# Patient Record
Sex: Male | Born: 1960 | Race: Black or African American | Hispanic: No | Marital: Married | Smoking: Former smoker
Health system: Southern US, Community
[De-identification: ages and names within clinical notes are randomized; demographics above are authoritative.]

## PROBLEM LIST (undated history)

## (undated) DIAGNOSIS — C642 Malignant neoplasm of left kidney, except renal pelvis: Secondary | ICD-10-CM

## (undated) DIAGNOSIS — M67439 Ganglion, unspecified wrist: Secondary | ICD-10-CM

## (undated) DIAGNOSIS — M1990 Unspecified osteoarthritis, unspecified site: Secondary | ICD-10-CM

## (undated) DIAGNOSIS — R109 Unspecified abdominal pain: Secondary | ICD-10-CM

## (undated) DIAGNOSIS — F329 Major depressive disorder, single episode, unspecified: Secondary | ICD-10-CM

## (undated) DIAGNOSIS — G8929 Other chronic pain: Secondary | ICD-10-CM

## (undated) DIAGNOSIS — K219 Gastro-esophageal reflux disease without esophagitis: Secondary | ICD-10-CM

## (undated) DIAGNOSIS — F32A Depression, unspecified: Secondary | ICD-10-CM

## (undated) DIAGNOSIS — Z9289 Personal history of other medical treatment: Secondary | ICD-10-CM

## (undated) DIAGNOSIS — M79606 Pain in leg, unspecified: Secondary | ICD-10-CM

## (undated) DIAGNOSIS — R0602 Shortness of breath: Secondary | ICD-10-CM

## (undated) DIAGNOSIS — G4733 Obstructive sleep apnea (adult) (pediatric): Secondary | ICD-10-CM

## (undated) DIAGNOSIS — I10 Essential (primary) hypertension: Secondary | ICD-10-CM

## (undated) HISTORY — DX: Malignant neoplasm of left kidney, except renal pelvis: C64.2

## (undated) HISTORY — DX: Other chronic pain: G89.29

## (undated) HISTORY — PX: CYSTECTOMY: SUR359

## (undated) HISTORY — DX: Pain in leg, unspecified: M79.606

## (undated) HISTORY — DX: Unspecified abdominal pain: R10.9

## (undated) HISTORY — DX: Gastro-esophageal reflux disease without esophagitis: K21.9

## (undated) HISTORY — PX: CHOLECYSTECTOMY: SHX55

## (undated) HISTORY — DX: Ganglion, unspecified wrist: M67.439

## (undated) HISTORY — DX: Obstructive sleep apnea (adult) (pediatric): G47.33

---

## 2000-08-07 ENCOUNTER — Encounter: Admission: RE | Admit: 2000-08-07 | Discharge: 2000-08-07 | Payer: Self-pay | Admitting: *Deleted

## 2001-08-10 ENCOUNTER — Encounter: Payer: Self-pay | Admitting: Emergency Medicine

## 2001-08-10 ENCOUNTER — Emergency Department (HOSPITAL_COMMUNITY): Admission: EM | Admit: 2001-08-10 | Discharge: 2001-08-10 | Payer: Self-pay | Admitting: Emergency Medicine

## 2003-10-08 ENCOUNTER — Emergency Department (HOSPITAL_COMMUNITY): Admission: EM | Admit: 2003-10-08 | Discharge: 2003-10-08 | Payer: Self-pay | Admitting: Emergency Medicine

## 2005-02-12 ENCOUNTER — Ambulatory Visit (HOSPITAL_COMMUNITY): Admission: RE | Admit: 2005-02-12 | Discharge: 2005-02-12 | Payer: Self-pay | Admitting: Internal Medicine

## 2005-05-01 ENCOUNTER — Ambulatory Visit (HOSPITAL_COMMUNITY): Admission: RE | Admit: 2005-05-01 | Discharge: 2005-05-01 | Payer: Self-pay | Admitting: Internal Medicine

## 2005-08-07 ENCOUNTER — Ambulatory Visit (HOSPITAL_COMMUNITY): Admission: RE | Admit: 2005-08-07 | Discharge: 2005-08-07 | Payer: Self-pay | Admitting: General Surgery

## 2005-08-07 ENCOUNTER — Encounter (INDEPENDENT_AMBULATORY_CARE_PROVIDER_SITE_OTHER): Payer: Self-pay | Admitting: *Deleted

## 2005-10-17 ENCOUNTER — Ambulatory Visit (HOSPITAL_COMMUNITY): Admission: RE | Admit: 2005-10-17 | Discharge: 2005-10-17 | Payer: Self-pay | Admitting: Internal Medicine

## 2006-11-08 ENCOUNTER — Emergency Department (HOSPITAL_COMMUNITY): Admission: EM | Admit: 2006-11-08 | Discharge: 2006-11-08 | Payer: Self-pay | Admitting: *Deleted

## 2006-11-10 ENCOUNTER — Emergency Department (HOSPITAL_COMMUNITY): Admission: EM | Admit: 2006-11-10 | Discharge: 2006-11-10 | Payer: Self-pay | Admitting: Emergency Medicine

## 2006-11-13 ENCOUNTER — Emergency Department (HOSPITAL_COMMUNITY): Admission: EM | Admit: 2006-11-13 | Discharge: 2006-11-13 | Payer: Self-pay | Admitting: Emergency Medicine

## 2009-05-25 ENCOUNTER — Emergency Department (HOSPITAL_COMMUNITY): Admission: EM | Admit: 2009-05-25 | Discharge: 2009-05-25 | Payer: Self-pay | Admitting: Emergency Medicine

## 2009-09-15 ENCOUNTER — Emergency Department (HOSPITAL_COMMUNITY): Admission: EM | Admit: 2009-09-15 | Discharge: 2009-09-15 | Payer: Self-pay | Admitting: Emergency Medicine

## 2010-07-22 LAB — POCT CARDIAC MARKERS: Myoglobin, poc: 113 ng/mL (ref 12–200)

## 2010-07-22 LAB — URINALYSIS, ROUTINE W REFLEX MICROSCOPIC
Bilirubin Urine: NEGATIVE
Glucose, UA: NEGATIVE mg/dL
Hgb urine dipstick: NEGATIVE
Ketones, ur: NEGATIVE mg/dL
Nitrite: NEGATIVE
Protein, ur: NEGATIVE mg/dL
Specific Gravity, Urine: 1.025 (ref 1.005–1.030)
Urobilinogen, UA: 0.2 mg/dL (ref 0.0–1.0)
pH: 6 (ref 5.0–8.0)

## 2010-07-22 LAB — RAPID URINE DRUG SCREEN, HOSP PERFORMED
Amphetamines: NOT DETECTED
Barbiturates: NOT DETECTED
Benzodiazepines: NOT DETECTED
Cocaine: NOT DETECTED
Opiates: NOT DETECTED
Tetrahydrocannabinol: POSITIVE — AB

## 2010-07-22 LAB — CBC
HCT: 40.1 % (ref 39.0–52.0)
MCHC: 34.2 g/dL (ref 30.0–36.0)
MCV: 94.5 fL (ref 78.0–100.0)
Platelets: 159 10*3/uL (ref 150–400)
RDW: 13.5 % (ref 11.5–15.5)

## 2010-07-22 LAB — BASIC METABOLIC PANEL
BUN: 13 mg/dL (ref 6–23)
CO2: 28 mEq/L (ref 19–32)
Chloride: 102 mEq/L (ref 96–112)
Creatinine, Ser: 1.36 mg/dL (ref 0.4–1.5)
Glucose, Bld: 100 mg/dL — ABNORMAL HIGH (ref 70–99)

## 2010-07-22 LAB — DIFFERENTIAL
Basophils Relative: 1 % (ref 0–1)
Eosinophils Absolute: 0.4 10*3/uL (ref 0.0–0.7)
Eosinophils Relative: 5 % (ref 0–5)
Neutrophils Relative %: 36 % — ABNORMAL LOW (ref 43–77)

## 2010-07-24 LAB — RPR: RPR Ser Ql: NONREACTIVE

## 2010-07-24 LAB — URINALYSIS, ROUTINE W REFLEX MICROSCOPIC
Glucose, UA: NEGATIVE mg/dL
Hgb urine dipstick: NEGATIVE
Ketones, ur: NEGATIVE mg/dL
Protein, ur: NEGATIVE mg/dL
pH: 6.5 (ref 5.0–8.0)

## 2010-07-24 LAB — GC/CHLAMYDIA PROBE AMP, GENITAL: GC Probe Amp, Genital: NEGATIVE

## 2010-09-21 NOTE — Op Note (Signed)
NAME:  Brandon Lawrence, Brandon Lawrence NO.:  [id number]   MEDICAL RECORD NO.:  [id number]          PATIENT TYPE:  AMB   LOCATION:  DAY                           FACILITY:  [facility]   PHYSICIAN:  Barbaraann Barthel, M.D. DATE OF BIRTH:  03-01-1961   DATE OF PROCEDURE:  08/07/2005  DATE OF DISCHARGE:                                 OPERATIVE REPORT   SURGEON:  Barbaraann Barthel, M.D.   PREOPERATIVE DIAGNOSIS:  Recurrent ganglion, right wrist.   POSTOPERATIVE DIAGNOSIS:  Final pathology pending.   Procedure:  Excision of ganglion cyst right wrist   NOTE:  This a 50 year old black male who had a recurrent cyst in his right  wrist.  He had had this removed.  Once in the office and once in surgery and  this had recurred.  Clinically this was a rather large cyst about 4-5 cm in  diameter at least.  This was adherent to the extensor tendons of the right  hand in the area of the thenar area and the dorsum of the right wrist.     We discussed excision of this and discussed complications not limited to  but including bleeding, infection, recurrence and the possibility that there  may be some paresthesias.  Informed consent was obtained.   TECHNIQUE:  The patient was placed in the supine position of the adequate  administration of Bier block anesthesia utilizing the tourniquet to 258 mmHg  we prepped and draped in the usual manner; and then used a longitudinal  incision, and then dissected a rather large ganglion cyst.  This was taken  out intact, using the needle cautery device to cauterize its area of  adhesion on the periosteum of the radius.  This was removed, carefully  dissecting around the neurovascular structures and the extensor tendons.  This was removed and sent for final pathology.  The wound was then  afterwards irrigated; and the skin was approximated subcutaneously with 4-0  Polysorb; and the skin approximated with 5-0 nylon, Neosporin with sterile  dressing was placed; and we  placed the patient as well in the  cock-up splint.  Prior to closure, all sponge, needle and instrument counts  were found to be correct.  Estimated blood loss was minimal.  The patient  tolerated the procedure well.  No drains were placed; and there were no  complications.      Barbaraann Barthel, M.D.  Electronically Signed     WB/MEDQ  D:  08/07/2005  T:  08/08/2005  Job:  [id number]   cc:   Tesfaye D. Felecia Shelling, MD  Fax: [phone]   Vickki Hearing, M.D.  Fax: [phone]

## 2011-02-19 LAB — CBC
Hemoglobin: 12.8 — ABNORMAL LOW
MCHC: 34.5
RBC: 4.01 — ABNORMAL LOW
WBC: 6.7

## 2011-02-19 LAB — POCT CARDIAC MARKERS
CKMB, poc: 1.7
CKMB, poc: 1.8
Myoglobin, poc: 127
Myoglobin, poc: 136
Operator id: 218581
Troponin i, poc: <0.05

## 2011-02-19 LAB — BASIC METABOLIC PANEL
CO2: 27
Calcium: 8.8
Creatinine, Ser: 1.37
GFR calc Af Amer: >60
Sodium: 136

## 2011-02-19 LAB — DIFFERENTIAL
Lymphocytes Relative: 41
Lymphs Abs: 2.8
Monocytes Absolute: 0.6
Monocytes Relative: 10
Neutro Abs: 2.9
Neutrophils Relative %: 43

## 2011-06-21 ENCOUNTER — Encounter: Payer: Self-pay | Admitting: Family Medicine

## 2011-06-21 ENCOUNTER — Ambulatory Visit (INDEPENDENT_AMBULATORY_CARE_PROVIDER_SITE_OTHER): Payer: Medicare Other | Admitting: Family Medicine

## 2011-06-21 VITALS — BP 140/82 | HR 87 | Resp 16 | Ht 67.0 in | Wt 152.1 lb

## 2011-06-21 DIAGNOSIS — R7309 Other abnormal glucose: Secondary | ICD-10-CM

## 2011-06-21 DIAGNOSIS — R0789 Other chest pain: Secondary | ICD-10-CM | POA: Diagnosis not present

## 2011-06-21 DIAGNOSIS — R51 Headache: Secondary | ICD-10-CM

## 2011-06-21 DIAGNOSIS — M6740 Ganglion, unspecified site: Secondary | ICD-10-CM

## 2011-06-21 DIAGNOSIS — Z13220 Encounter for screening for lipoid disorders: Secondary | ICD-10-CM | POA: Diagnosis not present

## 2011-06-21 DIAGNOSIS — R109 Unspecified abdominal pain: Secondary | ICD-10-CM

## 2011-06-21 DIAGNOSIS — N509 Disorder of male genital organs, unspecified: Secondary | ICD-10-CM

## 2011-06-21 DIAGNOSIS — IMO0002 Reserved for concepts with insufficient information to code with codable children: Secondary | ICD-10-CM

## 2011-06-21 DIAGNOSIS — R519 Headache, unspecified: Secondary | ICD-10-CM | POA: Insufficient documentation

## 2011-06-21 DIAGNOSIS — G8929 Other chronic pain: Secondary | ICD-10-CM | POA: Insufficient documentation

## 2011-06-21 LAB — CBC WITH DIFFERENTIAL/PLATELET
Eosinophils Absolute: 0.4 10*3/uL (ref 0.0–0.7)
HCT: 38.8 % — ABNORMAL LOW (ref 39.0–52.0)
Hemoglobin: 13.5 g/dL (ref 13.0–17.0)
Lymphs Abs: 3.4 10*3/uL (ref 0.7–4.0)
MCH: 31 pg (ref 26.0–34.0)
MCHC: 34.8 g/dL (ref 30.0–36.0)
Monocytes Absolute: 0.8 10*3/uL (ref 0.1–1.0)
Monocytes Relative: 10 % (ref 3–12)
Neutrophils Relative %: 40 % — ABNORMAL LOW (ref 43–77)
RBC: 4.35 MIL/uL (ref 4.22–5.81)

## 2011-06-21 LAB — HEMOGLOBIN A1C: Hgb A1c MFr Bld: 5.8 % — ABNORMAL HIGH (ref <5.7)

## 2011-06-21 LAB — COMPREHENSIVE METABOLIC PANEL
AST: 24 U/L (ref 0–37)
Albumin: 4.3 g/dL (ref 3.5–5.2)
Alkaline Phosphatase: 40 U/L (ref 39–117)
BUN: 12 mg/dL (ref 6–23)
Creat: 1.22 mg/dL (ref 0.50–1.35)
Glucose, Bld: 106 mg/dL — ABNORMAL HIGH (ref 70–99)
Potassium: 4.3 mEq/L (ref 3.5–5.3)
Total Bilirubin: 0.4 mg/dL (ref 0.3–1.2)

## 2011-06-21 LAB — LIPID PANEL
Cholesterol: 168 mg/dL (ref 0–200)
HDL: 42 mg/dL (ref >39)
Total CHOL/HDL Ratio: 4 Ratio
VLDL: 11 mg/dL (ref 0–40)

## 2011-06-21 LAB — LIPASE: Lipase: 27 U/L (ref 0–75)

## 2011-06-21 MED ORDER — OMEPRAZOLE 20 MG PO CPDR: 20.0000 mg | DELAYED_RELEASE_CAPSULE | Freq: Every day | ORAL | Status: DC

## 2011-06-21 NOTE — Assessment & Plan Note (Signed)
He has had 2 removed he currently has another on the right wrist. At this time we will monitor if it becomes problematic will refer for removal

## 2011-06-21 NOTE — Assessment & Plan Note (Signed)
I think he is getting some rebound headaches from that over use of Tylenol and Advil. I will need a more detailed history about his headaches and will monitor. At this time no other medications

## 2011-06-21 NOTE — Assessment & Plan Note (Signed)
Upon exam patient was planning to more scrotal pain. There is no swelling or rash noted. He's had this pain on and off for some time. He was referred to urology in the past but did not see them. No red flags

## 2011-06-21 NOTE — Patient Instructions (Signed)
Avoid using motrin pm every night, this can cause worsening stomach pains Start the stomach medication- take daily for heartburn  I will review your records from Dr. Felecia Shelling Please get the bloodwork done- do not eat after midnight F/U in 2 weeks
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
## 2011-06-21 NOTE — Assessment & Plan Note (Addendum)
Chronic abdominal pain. All of these records to see what workup he has had done. He has no abdominal scans in the [facility] system. Will start low dose PPI for heartburn symptoms

## 2011-06-21 NOTE — Assessment & Plan Note (Signed)
Is very difficult to get specifics on patient regarding his abdominal and chest pain. It appears very atypical and likely musculoskeletal. Other differentials include GI such as heartburn. EKG was unremarkable with exception of transient bradycardia. I will obtain records from his previous physicians. Last chest x-ray in 2011 was within normal limits. Blood work to be obtained

## 2011-06-28 ENCOUNTER — Encounter: Payer: Self-pay | Admitting: Family Medicine

## 2011-07-05 ENCOUNTER — Ambulatory Visit (INDEPENDENT_AMBULATORY_CARE_PROVIDER_SITE_OTHER): Payer: Medicare Other | Admitting: Family Medicine

## 2011-07-05 ENCOUNTER — Encounter: Payer: Self-pay | Admitting: Family Medicine

## 2011-07-05 VITALS — BP 134/72 | HR 68 | Resp 18 | Ht 67.0 in | Wt 152.0 lb

## 2011-07-05 DIAGNOSIS — Z23 Encounter for immunization: Secondary | ICD-10-CM

## 2011-07-05 DIAGNOSIS — R12 Heartburn: Secondary | ICD-10-CM | POA: Diagnosis not present

## 2011-07-05 DIAGNOSIS — R0789 Other chest pain: Secondary | ICD-10-CM

## 2011-07-05 DIAGNOSIS — R109 Unspecified abdominal pain: Secondary | ICD-10-CM

## 2011-07-05 DIAGNOSIS — IMO0002 Reserved for concepts with insufficient information to code with codable children: Secondary | ICD-10-CM

## 2011-07-05 DIAGNOSIS — N509 Disorder of male genital organs, unspecified: Secondary | ICD-10-CM

## 2011-07-05 DIAGNOSIS — G8929 Other chronic pain: Secondary | ICD-10-CM

## 2011-07-05 NOTE — Progress Notes (Signed)
  Subjective:    Patient ID: Brandon Lawrence, male    DOB: [birth year], 51 y.o.   MRN: [id number]  HPI Patient here to followup medication and labs. He was started on omeprazole for his abdominal pain and chest pain which is thought to be likely acid related. He states his abdominal pain is much improved he is able to sleep. He denies any further chest pain.  Scrotal pain- he continues to have pains in his scrotal region. His exam was normal and her last visit.No difficulty urinating, no swelling  Labs reviewed  Review of Systems - per above  GEN- No recent illness,   GU- no change in bowel or bladder ,  CVS- No CP   RESP- no SOB     Objective:   Physical Exam GEN-NAD, alert and oriented x 3       Assessment & Plan:

## 2011-07-05 NOTE — Patient Instructions (Signed)
I will set you up with a urologist for the scrotal pain, we will call with the appointment time  Continue the stomach medication- omeprazole  Your labs look good. F/U in 6 months

## 2011-07-07 DIAGNOSIS — K219 Gastro-esophageal reflux disease without esophagitis: Secondary | ICD-10-CM | POA: Insufficient documentation

## 2011-07-07 NOTE — Assessment & Plan Note (Signed)
Will refer to urology

## 2011-07-07 NOTE — Assessment & Plan Note (Signed)
Improved with PPI, will continue

## 2011-07-07 NOTE — Assessment & Plan Note (Signed)
Heartburn component improved, no further work-up at this time REviewed labs

## 2011-07-25 DIAGNOSIS — F411 Generalized anxiety disorder: Secondary | ICD-10-CM | POA: Diagnosis not present

## 2011-08-03 ENCOUNTER — Encounter (HOSPITAL_COMMUNITY): Payer: Self-pay | Admitting: *Deleted

## 2011-08-03 ENCOUNTER — Emergency Department (HOSPITAL_COMMUNITY)
Admission: EM | Admit: 2011-08-03 | Discharge: 2011-08-03 | Disposition: A | Discharge status: Home / Self Care | Payer: Medicare Other | Attending: Emergency Medicine | Admitting: Emergency Medicine

## 2011-08-03 DIAGNOSIS — S00522A Blister (nonthermal) of oral cavity, initial encounter: Secondary | ICD-10-CM

## 2011-08-03 DIAGNOSIS — F17200 Nicotine dependence, unspecified, uncomplicated: Secondary | ICD-10-CM | POA: Diagnosis not present

## 2011-08-03 DIAGNOSIS — S1092XA Blister (nonthermal) of unspecified part of neck, initial encounter: Secondary | ICD-10-CM | POA: Diagnosis not present

## 2011-08-03 DIAGNOSIS — S0002XA Blister (nonthermal) of scalp, initial encounter: Secondary | ICD-10-CM | POA: Diagnosis not present

## 2011-08-03 DIAGNOSIS — K1370 Unspecified lesions of oral mucosa: Secondary | ICD-10-CM | POA: Diagnosis not present

## 2011-08-03 NOTE — Discharge Instructions (Signed)
Blisters Blisters are fluid-filled sacs that form within the skin. Common causes of blistering are friction, burns, and exposure to irritating chemicals. The fluid in the blister protects the underlying damaged skin. Most of the time it is not recommended that you open blisters. When a blister is opened, there is an increased chance for infection. Usually, a blister will open on its own. They then dry up and peel off within 10 days. If the blister is tense and uncomfortable (painful) the fluid may be drained. If it is drained the roof of the blister should be left intact. The draining should only be done by a medical professional under aseptic conditions. Poorly fitting shoes and boots can cause blisters by being too tight or too loose. Wearing extra socks or using tape, bandages, or pads over the blister-prone area helps prevent the problem by reducing friction. Blisters heal more slowly if you have diabetes or if you have problems with your circulation. You need to be careful about medical follow-up to prevent infection. HOME CARE INSTRUCTIONS  Protect areas where blisters have formed until the skin is healed. Use a special bandage with a hole cut in the middle around the blister. This reduces pressure and friction. When the blister breaks, trim off the loose skin and keep the area clean by washing it with soap daily. Soaking the blister or broken-open blister with diluted vinegar twice daily for 15 minutes will dry it up and speed the healing. Use 3 tablespoons of white vinegar per quart of water (45 mL white vinegar per liter of water). An antibiotic ointment and a bandage can be used to cover the area after soaking.  SEEK MEDICAL CARE IF:   You develop increased redness, pain, swelling, or drainage in the blistered area.   You develop a pus-like discharge from the blistered area, chills, or a fever.  MAKE SURE YOU:   Understand these instructions.   Will watch your condition.   Will get help  right away if you are not doing well or get worse.  Document Released: 05/30/2004 Document Revised: 04/11/2011 Document Reviewed: 04/27/2008 [employer] Patient Information 2012 [employer]. 

## 2011-08-03 NOTE — ED Notes (Signed)
Pt has a large blood filled blister in the back of his mouth. Pt denies injury.

## 2011-08-03 NOTE — ED Provider Notes (Signed)
History   This chart was scribed for Geoffery Lyons, MD by Melba Coon. The patient was seen in room [facility] and the patient's care was started at 9:58AM.    CSN: [id number]  Arrival date & time 08/03/11  8295   First MD Initiated Contact with Patient 08/03/11 [phone]      Chief Complaint  Patient presents with  . Mouth Lesions    (Consider location/radiation/quality/duration/timing/severity/associated sxs/prior treatment) HPI Brandon Lawrence is a 51 y.o. male who presents to the Emergency Department complaining of a moderate to severely large, blood-filled blister in the back of his mouth with an onset yesterday. No trauma in the mouth; pt states that he was "giving a hickey to a girl" yesterday. Later on, he noticed that he "felt something funny" in his mouth and when he woke up this morning, the blister was very enlarged. Pt states that the blister is not painful and that he has never experienced anything like this before. No HA, fever, neck pain, back pain, CP, SOB, abd pain, n/v/d, or extremity pain, numbness, weakness, or tingling. No known allergies. No other pertinent medical problems.  Past Medical History  Diagnosis Date  . Chronic abdominal pain   . Ganglion cyst of wrist     Recurrent    Past Surgical History  Procedure Date  . Gallstones removed   . Cystectomy     Ganglion- right wrist    Family History  Problem Relation Age of Onset  . Diabetes Brother   . Cancer Brother   . Thyroid disease Mother     History  Substance Use Topics  . Smoking status: Former Smoker -- 1.0 packs/day for 20 years    Types: Cigarettes  . Smokeless tobacco: Not on file  . Alcohol Use: No     previous alcoholic      Review of Systems 10 Systems reviewed and all are negative for acute change except as noted in the HPI.   Allergies  Review of patient's allergies indicates no known allergies.  Home Medications   Current Outpatient Rx  Name Route Sig Dispense Refill   . DIPHENHYDRAMINE-APAP (SLEEP) 25-500 MG PO TABS Oral Take 1 tablet by mouth at bedtime as needed.    . IBUPROFEN 200 MG PO TABS Oral Take 200 mg by mouth every 6 (six) hours as needed.    [name] OMEPRAZOLE 20 MG PO CPDR Oral Take 1 capsule (20 mg total) by mouth daily. 30 capsule 1    BP 143/71  Pulse 84  Temp(Src) 98.1 F (36.7 C) (Oral)  Resp 18  Ht 5\' 7"  (1.702 m)  Wt 152 lb (68.947 kg)  BMI 23.81 kg/m2  SpO2 100%  Physical Exam  Nursing note and vitals reviewed. Constitutional: He is oriented to person, place, and time. He appears well-developed and well-nourished.       Awake, alert, nontoxic appearance.  HENT:  Head: Normocephalic and atraumatic.       On roof of mouth, 1 cm round blood blister located on back on hard palate; no surrounding erythema, remainder of oropharynx appears unremarkable  Eyes: EOM are normal. Pupils are equal, round, and reactive to light. Right eye exhibits no discharge. Left eye exhibits no discharge.  Neck: Normal range of motion. Neck supple.  Cardiovascular: Normal rate and regular rhythm.   Pulmonary/Chest: Effort normal. He exhibits no tenderness.  Abdominal: Soft. There is no tenderness. There is no rebound.  Musculoskeletal: He exhibits no tenderness.  Baseline ROM, no obvious new focal weakness.  Neurological: He is alert and oriented to person, place, and time.       Mental status and motor strength appears baseline for patient and situation.  Skin: Skin is warm. No rash noted.  Psychiatric: He has a normal mood and affect. His behavior is normal.    ED Course  Procedures (including critical care time)  DIAGNOSTIC STUDIES: Oxygen Saturation is 100% on room air, normal by my interpretation.    COORDINATION OF CARE:  10:03AM - EDMD states that the blister will have to run its course and pop on its own. EDMD suspects that it is not an infection and therefore abx will not be needed. Pt is advised to f/u if it gets worse, but pt is  ready for d/c.    Labs Reviewed - No data to display No results found.   No diagnosis found.    MDM   I personally performed the services described in this documentation, which was scribed in my presence. The recorded information has been reviewed and considered.          Geoffery Lyons, MD 08/03/11 [phone]

## 2011-08-08 DIAGNOSIS — F411 Generalized anxiety disorder: Secondary | ICD-10-CM | POA: Diagnosis not present

## 2012-01-07 ENCOUNTER — Ambulatory Visit: Payer: Medicare Other | Admitting: Family Medicine

## 2012-01-23 ENCOUNTER — Encounter: Payer: Self-pay | Admitting: Family Medicine

## 2012-01-23 ENCOUNTER — Ambulatory Visit (INDEPENDENT_AMBULATORY_CARE_PROVIDER_SITE_OTHER): Payer: Medicare Other | Admitting: Family Medicine

## 2012-01-23 VITALS — BP 120/80 | HR 61 | Resp 16 | Ht 67.0 in | Wt 150.0 lb

## 2012-01-23 DIAGNOSIS — Z23 Encounter for immunization: Secondary | ICD-10-CM

## 2012-01-23 DIAGNOSIS — G8929 Other chronic pain: Secondary | ICD-10-CM

## 2012-01-23 DIAGNOSIS — R109 Unspecified abdominal pain: Secondary | ICD-10-CM

## 2012-01-23 DIAGNOSIS — K219 Gastro-esophageal reflux disease without esophagitis: Secondary | ICD-10-CM

## 2012-01-23 DIAGNOSIS — Z1211 Encounter for screening for malignant neoplasm of colon: Secondary | ICD-10-CM

## 2012-01-23 DIAGNOSIS — IMO0002 Reserved for concepts with insufficient information to code with codable children: Secondary | ICD-10-CM

## 2012-01-23 DIAGNOSIS — N509 Disorder of male genital organs, unspecified: Secondary | ICD-10-CM

## 2012-01-23 MED ORDER — OMEPRAZOLE 20 MG PO CPDR: 20.0000 mg | DELAYED_RELEASE_CAPSULE | Freq: Every day | ORAL | Status: DC

## 2012-01-23 NOTE — Patient Instructions (Addendum)
Referral for colonoscopy Restart the acid pill Call the urologist and get a an appointment  Flu shot given F/U 6 months

## 2012-01-23 NOTE — Progress Notes (Signed)
  Subjective:    Patient ID: Brandon Lawrence, male    DOB: [birth year], 51 y.o.   MRN: [id number]  HPI   Patient presents to followup his abdominal pain. He ran out of his Prilosec and since then has had recurrent abdominal pain up into chest which she feels a burning sensation in his chest. He denies sharp chest pain that is radiating denies shortness of breath. He continues to have testicular pain he was seen by urology but missed his followup appointment. He also complains of erectile dysfunction was given medication by urology however does not know the name of this.  Review of Systems  GEN- denies fatigue, fever, weight loss,weakness, recent illness HEENT- denies eye drainage, change in vision, nasal discharge, CVS- denies chest pain, palpitations RESP- denies SOB, cough, wheeze ABD- denies N/V, change in stools, abd pain, +heartburn  GU- denies dysuria, hematuria, dribbling, incontinence MSK- denies joint pain, muscle aches, injury Neuro- denies headache, dizziness, syncope, seizure activity      Objective:   Physical Exam GEN- NAD, alert and oriented x3 HEENT- PERRL, EOMI, non injected sclera, pink conjunctiva, MMM, oropharynx clear Neck- Supple, no thryomegaly CVS- RRR, no murmur RESP-CTAB ABD-NABS,soft,NT,ND EXT- No edema Pulses- Radial, DP- 2+   EKG- sinus bradycardia     Assessment & Plan:

## 2012-01-26 NOTE — Assessment & Plan Note (Signed)
Restart PPI his abd pain and chest symptoms are consistent with acid reflux, these all resolved when he was on PPI previously

## 2012-01-26 NOTE — Assessment & Plan Note (Signed)
Urology note states exam was normal and they felt it was more anxiety, he was given limbitrol, which he is not taking currently , he was to f/u with them, he will discuss his ED as well.

## 2012-01-26 NOTE — Assessment & Plan Note (Signed)
Improves with treatment of GERD, minimal weight change, refer for colonoscopy

## 2012-01-30 ENCOUNTER — Ambulatory Visit (INDEPENDENT_AMBULATORY_CARE_PROVIDER_SITE_OTHER): Payer: Medicare Other | Admitting: Gastroenterology

## 2012-01-30 ENCOUNTER — Encounter: Payer: Self-pay | Admitting: Gastroenterology

## 2012-01-30 VITALS — BP 135/75 | HR 53 | Temp 97.4°F | Ht 66.0 in | Wt 148.8 lb

## 2012-01-30 DIAGNOSIS — Z1211 Encounter for screening for malignant neoplasm of colon: Secondary | ICD-10-CM | POA: Insufficient documentation

## 2012-01-30 DIAGNOSIS — K3189 Other diseases of stomach and duodenum: Secondary | ICD-10-CM | POA: Diagnosis not present

## 2012-01-30 DIAGNOSIS — M542 Cervicalgia: Secondary | ICD-10-CM

## 2012-01-30 DIAGNOSIS — R1013 Epigastric pain: Secondary | ICD-10-CM | POA: Insufficient documentation

## 2012-01-30 MED ORDER — PEG 3350-KCL-NA BICARB-NACL 420 G PO SOLR: 4000.0000 mL | ORAL | Status: DC

## 2012-01-30 NOTE — Patient Instructions (Addendum)
We have set you up for an upper endoscopy and colonoscopy with Dr. Jena Gauss in the near future.  Continue to take Prilosec daily.   We have also set you up for an ultrasound of your neck. We will contact you with the results.
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
## 2012-01-31 ENCOUNTER — Ambulatory Visit (HOSPITAL_COMMUNITY)
Admission: RE | Admit: 2012-01-31 | Discharge: 2012-01-31 | Disposition: A | Discharge status: Home / Self Care | Payer: Medicare Other | Source: Ambulatory Visit | Attending: Gastroenterology | Admitting: Gastroenterology

## 2012-01-31 DIAGNOSIS — R599 Enlarged lymph nodes, unspecified: Secondary | ICD-10-CM | POA: Insufficient documentation

## 2012-01-31 DIAGNOSIS — M542 Cervicalgia: Secondary | ICD-10-CM

## 2012-02-03 ENCOUNTER — Other Ambulatory Visit: Payer: Self-pay | Admitting: Gastroenterology

## 2012-02-03 DIAGNOSIS — R1013 Epigastric pain: Secondary | ICD-10-CM

## 2012-02-03 NOTE — Progress Notes (Signed)
Faxed to PCP

## 2012-02-03 NOTE — Assessment & Plan Note (Signed)
Left submandibular adenopathy, TTP. Pt without current sinus infection, cold, illness, etc. States it has been bothering him for "some time". [place] of neck in near future, defer to PCP for further management.

## 2012-02-03 NOTE — Assessment & Plan Note (Signed)
Upper abdominal pain, chronic N/V, regurgitation in the setting of chronic NSAIDs and GERD. Recently placed on Prilosec by PCP. No dysphagia. Proceed with EGD at time of TCS due to new -onset dyspepsia. As of note, does have hx of chronic abdominal pain, but his upper GI tract has not been evaluated in past.   Proceed with upper endoscopy in the near future with Dr. Jena Gauss. The risks, benefits, and alternatives have been discussed in detail with patient. They have stated understanding and desire to proceed.

## 2012-02-03 NOTE — Assessment & Plan Note (Signed)
51 year old male with need for initial screening colonoscopy. Notes lower abdominal discomfort relieved after BM, no rectal bleeding. Bilateral groin pain X 3 years. Proceed with TCS in near future. As of note, no CT on file. Consider CT for groin pain.   Proceed with TCS with Dr. Jena Gauss in near future: the risks, benefits, and alternatives have been discussed with the patient in detail. The patient states understanding and desires to proceed.

## 2012-02-10 ENCOUNTER — Encounter (HOSPITAL_COMMUNITY): Payer: Self-pay | Admitting: Pharmacy Technician

## 2012-02-11 DIAGNOSIS — N509 Disorder of male genital organs, unspecified: Secondary | ICD-10-CM | POA: Diagnosis not present

## 2012-02-12 ENCOUNTER — Other Ambulatory Visit: Payer: Self-pay | Admitting: Internal Medicine

## 2012-02-12 DIAGNOSIS — Z1211 Encounter for screening for malignant neoplasm of colon: Secondary | ICD-10-CM

## 2012-02-12 DIAGNOSIS — R1013 Epigastric pain: Secondary | ICD-10-CM

## 2012-02-12 MED ORDER — SODIUM CHLORIDE 0.45 % IV SOLN
INTRAVENOUS | Status: DC
  Administered 2012-02-13: 1000 mL via INTRAVENOUS

## 2012-02-13 ENCOUNTER — Encounter (HOSPITAL_COMMUNITY): Payer: Self-pay | Admitting: *Deleted

## 2012-02-13 ENCOUNTER — Encounter (HOSPITAL_COMMUNITY)
Admission: RE | Disposition: A | Discharge status: Home / Self Care | Payer: Self-pay | Source: Ambulatory Visit | Attending: Internal Medicine

## 2012-02-13 ENCOUNTER — Other Ambulatory Visit: Payer: Self-pay | Admitting: General Practice

## 2012-02-13 ENCOUNTER — Ambulatory Visit (HOSPITAL_COMMUNITY)
Admission: RE | Admit: 2012-02-13 | Discharge: 2012-02-13 | Disposition: A | Discharge status: Home / Self Care | Payer: Medicare Other | Source: Ambulatory Visit | Attending: Internal Medicine | Admitting: Internal Medicine

## 2012-02-13 DIAGNOSIS — K3189 Other diseases of stomach and duodenum: Secondary | ICD-10-CM | POA: Diagnosis not present

## 2012-02-13 DIAGNOSIS — R109 Unspecified abdominal pain: Secondary | ICD-10-CM

## 2012-02-13 DIAGNOSIS — Z1211 Encounter for screening for malignant neoplasm of colon: Secondary | ICD-10-CM | POA: Diagnosis not present

## 2012-02-13 DIAGNOSIS — K449 Diaphragmatic hernia without obstruction or gangrene: Secondary | ICD-10-CM | POA: Insufficient documentation

## 2012-02-13 DIAGNOSIS — R1013 Epigastric pain: Secondary | ICD-10-CM

## 2012-02-13 DIAGNOSIS — R933 Abnormal findings on diagnostic imaging of other parts of digestive tract: Secondary | ICD-10-CM

## 2012-02-13 DIAGNOSIS — D126 Benign neoplasm of colon, unspecified: Secondary | ICD-10-CM | POA: Diagnosis not present

## 2012-02-13 DIAGNOSIS — K219 Gastro-esophageal reflux disease without esophagitis: Secondary | ICD-10-CM

## 2012-02-13 HISTORY — PX: COLONOSCOPY: SHX174

## 2012-02-13 HISTORY — PX: ESOPHAGOGASTRODUODENOSCOPY: SHX1529

## 2012-02-13 SURGERY — COLONOSCOPY WITH ESOPHAGOGASTRODUODENOSCOPY (EGD)
Anesthesia: Moderate Sedation

## 2012-02-13 MED ORDER — MEPERIDINE HCL 100 MG/ML IJ SOLN
INTRAMUSCULAR | Status: DC | PRN
  Administered 2012-02-13: 50 mg via INTRAVENOUS
  Administered 2012-02-13: 25 mg via INTRAVENOUS

## 2012-02-13 MED ORDER — PROMETHAZINE HCL 25 MG/ML IJ SOLN
INTRAMUSCULAR | Status: AC
  Filled 2012-02-13: qty 1

## 2012-02-13 MED ORDER — MEPERIDINE HCL 100 MG/ML IJ SOLN
INTRAMUSCULAR | Status: AC
  Filled 2012-02-13: qty 2

## 2012-02-13 MED ORDER — MIDAZOLAM HCL 5 MG/5ML IJ SOLN
INTRAMUSCULAR | Status: DC | PRN
  Administered 2012-02-13: 1 mg via INTRAVENOUS
  Administered 2012-02-13: 2 mg via INTRAVENOUS

## 2012-02-13 MED ORDER — STERILE WATER FOR IRRIGATION IR SOLN
Status: DC | PRN
  Administered 2012-02-13: 12:00:00

## 2012-02-13 MED ORDER — BUTAMBEN-TETRACAINE-BENZOCAINE 2-2-14 % EX AERO
INHALATION_SPRAY | CUTANEOUS | Status: DC | PRN
  Administered 2012-02-13: 1 via TOPICAL

## 2012-02-13 MED ORDER — MIDAZOLAM HCL 5 MG/5ML IJ SOLN
INTRAMUSCULAR | Status: AC
  Filled 2012-02-13: qty 5

## 2012-02-13 MED ORDER — PROMETHAZINE HCL 25 MG/ML IJ SOLN
25.0000 mg | Freq: Once | INTRAMUSCULAR | Status: AC
  Administered 2012-02-13: 25 mg via INTRAVENOUS

## 2012-02-13 MED ORDER — MIDAZOLAM HCL 2 MG/2ML IJ SOLN
INTRAMUSCULAR | Status: AC
  Filled 2012-02-13: qty 6
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
## 2012-02-13 NOTE — Op Note (Signed)
[facility] [address] [place], [address]   COLONOSCOPY PROCEDURE REPORT  PATIENT: Brandon Lawrence, Brandon Lawrence  MR#:         [id number] BIRTHDATE: Apr 23, 1961 , 51  yrs. old GENDER: Male ENDOSCOPIST: R.  Roetta Sessions, MD FACP FACG REFERRED BY:  Milinda Antis, M.D. PROCEDURE DATE:  02/13/2012 PROCEDURE:     colonoscopy with snare polypectomy  INDICATIONS: First ever screening colonoscopy-average risk examination  INFORMED CONSENT:  The risks, benefits, alternatives and imponderables including but not limited to bleeding, perforation as well as the possibility of a missed lesion have been reviewed.  The potential for biopsy, lesion removal, etc. have also been discussed.  Questions have been answered.  All parties agreeable. Please see the history and physical in the medical record for more information.  MEDICATIONS: Phenergan 25 mg IV and Versed 3 mg IV and Demerol 75 IV in divided doses.  DESCRIPTION OF PROCEDURE:  After a digital rectal exam was performed, the Pentax Colonoscope [id number]  colonoscope was advanced from the anus through the rectum and colon to the area of the cecum, ileocecal valve and appendiceal orifice.  The cecum was deeply intubated.  These structures were well-seen and photographed for the record.  From the level of the cecum and ileocecal valve, the scope was slowly and cautiously withdrawn.  The mucosal surfaces were carefully surveyed utilizing scope tip deflection to facilitate fold flattening as needed.  The scope was pulled down into the rectum where a thorough examination including retroflexion was performed.    FINDINGS:  Adequate preparation. Normal rectum. Single 4 mm polyp in the base the cecum; otherwise, the remainder of the colonic mucosa appeared normal.  THERAPEUTIC / DIAGNOSTIC MANEUVERS PERFORMED:  the cecal polyp noted above was removed with cold snare technique and recovered for  the pathologist  COMPLICATIONS: none  CECAL WITHDRAWAL TIME:  8 minutes  IMPRESSION:  colonic polyp (1)-removed as described above  RECOMMENDATIONS: Follow up on pathology. See EGD report   _______________________________ eSigned:  R. Roetta Sessions, MD FACP [facility] 02/13/2012 12:14 PM   CC:

## 2012-02-13 NOTE — Interval H&P Note (Signed)
History and Physical Interval Note:  02/13/2012 11:36 AM  Brandon Lawrence  has presented today for surgery, with the diagnosis of DYSPEPSIA  AND SCREENING COLONOSCOPY  The various methods of treatment have been discussed with the patient and family. After consideration of risks, benefits and other options for treatment, the patient has consented to  Procedure(s) (LRB) with comments: COLONOSCOPY WITH ESOPHAGOGASTRODUODENOSCOPY (EGD) (N/A) - 12:30/GIVE 25MG  PHENERGAN IV PRIOR TO PROCEDURE as a surgical intervention .  The patient's history has been reviewed, patient examined, no change in status, stable for surgery.  I have reviewed the patient's chart and labs.  Questions were answered to the patient's satisfaction.     Eula Listen

## 2012-02-13 NOTE — Op Note (Signed)
[facility] [address] [place], [address]   ENDOSCOPY PROCEDURE REPORT  PATIENT: Brandon, Lawrence  MR#: [id number] BIRTHDATE: 01/23/1961 , 51  yrs. old GENDER: Male ENDOSCOPIST: R.  Roetta Sessions, MD FACP FACG REFERRED BY:  Milinda Antis, M.D. PROCEDURE DATE:  02/13/2012 PROCEDURE:     diagnostic EGD  INDICATIONS:     Dyspepsia/GERD/NSAID use  INFORMED CONSENT:   The risks, benefits, limitations, alternatives and imponderables have been discussed.  The potential for biopsy, esophogeal dilation, etc. have also been reviewed.  Questions have been answered.  All parties agreeable.  Please see the history and physical in the medical record for more information.  MEDICATIONS:    Versed 2 mg IV and Demerol 50 mg IV. Phenergan 25 mg IV. Cetacaine spray.  DESCRIPTION OF PROCEDURE:   The [id number] ([id number])  endoscope was introduced through the mouth and advanced to the second portion of the duodenum without difficulty or limitations.  The mucosal surfaces were surveyed very carefully during advancement of the scope and upon withdrawal.  Retroflexion view of the proximal stomach and esophagogastric junction was performed.      FINDINGS: Normal esophagus. Stomach empty. Small hiatal hernia. Fine submucosal petechiae of uncertain significance. No ulcer or infiltrating process. Pylorus patent. Normal first and second portion of the duodenum  THERAPEUTIC / DIAGNOSTIC MANEUVERS PERFORMED:  biopsies of the gastric antrum and body were taken to assess for Helicobacter pyloric   COMPLICATIONS:  None  IMPRESSION:  Hiatal hernia. Abnormal gastric mucosa-of uncertain significance-status post biopsy  RECOMMENDATIONS:  Followup on pathology. See colonoscopy report.    _______________________________ R. Roetta Sessions, MD FACP [facility] eSigned:  R. Roetta Sessions, MD FACP [facility] 02/13/2012 11:55 AM     CC:

## 2012-02-17 ENCOUNTER — Encounter: Payer: Self-pay | Admitting: Internal Medicine

## 2012-02-17 ENCOUNTER — Ambulatory Visit (HOSPITAL_COMMUNITY): Payer: Medicare Other

## 2012-02-18 ENCOUNTER — Ambulatory Visit (HOSPITAL_COMMUNITY)
Admission: RE | Admit: 2012-02-18 | Discharge: 2012-02-18 | Disposition: A | Discharge status: Home / Self Care | Payer: Medicare Other | Source: Ambulatory Visit | Attending: Internal Medicine | Admitting: Internal Medicine

## 2012-02-18 DIAGNOSIS — R109 Unspecified abdominal pain: Secondary | ICD-10-CM | POA: Diagnosis not present

## 2012-02-18 DIAGNOSIS — R1031 Right lower quadrant pain: Secondary | ICD-10-CM | POA: Diagnosis not present

## 2012-02-18 DIAGNOSIS — R1032 Left lower quadrant pain: Secondary | ICD-10-CM | POA: Insufficient documentation

## 2012-02-18 DIAGNOSIS — N289 Disorder of kidney and ureter, unspecified: Secondary | ICD-10-CM | POA: Insufficient documentation

## 2012-02-18 DIAGNOSIS — R933 Abnormal findings on diagnostic imaging of other parts of digestive tract: Secondary | ICD-10-CM | POA: Insufficient documentation

## 2012-02-18 DIAGNOSIS — R110 Nausea: Secondary | ICD-10-CM | POA: Diagnosis not present

## 2012-02-18 DIAGNOSIS — R112 Nausea with vomiting, unspecified: Secondary | ICD-10-CM | POA: Diagnosis not present

## 2012-02-18 MED ORDER — IOHEXOL 300 MG/ML  SOLN
100.0000 mL | Freq: Once | INTRAMUSCULAR | Status: AC | PRN
  Administered 2012-02-18: 100 mL via INTRAVENOUS

## 2012-02-19 ENCOUNTER — Encounter: Payer: Self-pay | Admitting: *Deleted

## 2012-02-19 ENCOUNTER — Telehealth: Payer: Self-pay

## 2012-02-19 ENCOUNTER — Other Ambulatory Visit: Payer: Self-pay | Admitting: Internal Medicine

## 2012-02-19 DIAGNOSIS — N289 Disorder of kidney and ureter, unspecified: Secondary | ICD-10-CM

## 2012-02-19 NOTE — Progress Notes (Signed)
Quick Note:  Please let pt know he has a lymph node that is enlarged, left neck. I want to make sure he follows up with his PCP about this. Please CC to PCP for further management, reassessment.   ______

## 2012-02-19 NOTE — Telephone Encounter (Signed)
Patient is scheduled for MRI on Friday October 18th at 1:00 and he is aware

## 2012-02-19 NOTE — Progress Notes (Signed)
Faxed to PCP

## 2012-02-19 NOTE — Telephone Encounter (Signed)
CT findings noted. Proceed with an MRI with and without contrast to evaluate abnormal renal lesion ASAP   ----- Message -----   From: Evalee Mutton, LPN   Sent: 40/98/1191 8:25 AM   To: Corbin Ade, MD      Please look at the ct on this pt. They called from [facility] radiology with a report this morning.

## 2012-02-19 NOTE — Telephone Encounter (Signed)
Pt is aware. Leighann please schedule MRI

## 2012-02-21 ENCOUNTER — Ambulatory Visit (HOSPITAL_COMMUNITY)
Admission: RE | Admit: 2012-02-21 | Discharge: 2012-02-21 | Disposition: A | Discharge status: Home / Self Care | Payer: Medicare Other | Source: Ambulatory Visit | Attending: Internal Medicine | Admitting: Internal Medicine

## 2012-02-21 DIAGNOSIS — N289 Disorder of kidney and ureter, unspecified: Secondary | ICD-10-CM | POA: Diagnosis not present

## 2012-02-21 MED ORDER — GADOBENATE DIMEGLUMINE 529 MG/ML IV SOLN
14.0000 mL | Freq: Once | INTRAVENOUS | Status: AC | PRN
  Administered 2012-02-21: 14 mL via INTRAVENOUS

## 2012-02-22 ENCOUNTER — Telehealth: Payer: Self-pay | Admitting: Internal Medicine

## 2012-02-22 NOTE — Telephone Encounter (Signed)
Called pt on his cell [phone]). Discussed MRI findings with pt. I told him lesion in left kidney was suspicious and needed to be removed; I also strongly recommended follow-up with Dr. Jeanice Lim regarding the enlarged cervical lymph node as well; will need to see the urologist- can get referral from Dr. Jeanice Lim.

## 2012-02-24 ENCOUNTER — Telehealth: Payer: Self-pay | Admitting: Family Medicine

## 2012-02-24 DIAGNOSIS — N289 Disorder of kidney and ureter, unspecified: Secondary | ICD-10-CM

## 2012-02-24 DIAGNOSIS — R590 Localized enlarged lymph nodes: Secondary | ICD-10-CM

## 2012-02-24 NOTE — Telephone Encounter (Signed)
Spoke with pt, he was aware of MRI results per Dr. Jena Gauss, referral to [facility] urology for for further evaluation Regarding lymph node he states occasionally he feels the know but it has never been swollen, ultrasound reviewed, concern with his possible RCC for LAD, will send to ENT for biopsy

## 2012-02-25 DIAGNOSIS — R221 Localized swelling, mass and lump, neck: Secondary | ICD-10-CM | POA: Diagnosis not present

## 2012-02-25 DIAGNOSIS — R599 Enlarged lymph nodes, unspecified: Secondary | ICD-10-CM | POA: Diagnosis not present

## 2012-02-25 DIAGNOSIS — R220 Localized swelling, mass and lump, head: Secondary | ICD-10-CM | POA: Diagnosis not present

## 2012-02-26 ENCOUNTER — Other Ambulatory Visit (INDEPENDENT_AMBULATORY_CARE_PROVIDER_SITE_OTHER): Payer: Self-pay | Admitting: Otolaryngology

## 2012-02-26 DIAGNOSIS — N289 Disorder of kidney and ureter, unspecified: Secondary | ICD-10-CM | POA: Diagnosis not present

## 2012-02-27 ENCOUNTER — Ambulatory Visit (HOSPITAL_COMMUNITY)
Admission: RE | Admit: 2012-02-27 | Discharge: 2012-02-27 | Disposition: A | Discharge status: Home / Self Care | Payer: Medicare Other | Source: Ambulatory Visit | Attending: Otolaryngology | Admitting: Otolaryngology

## 2012-02-27 DIAGNOSIS — R599 Enlarged lymph nodes, unspecified: Secondary | ICD-10-CM | POA: Diagnosis not present

## 2012-02-27 DIAGNOSIS — R6889 Other general symptoms and signs: Secondary | ICD-10-CM | POA: Diagnosis not present

## 2012-02-27 MED ORDER — IOHEXOL 300 MG/ML  SOLN
100.0000 mL | Freq: Once | INTRAMUSCULAR | Status: AC | PRN
  Administered 2012-02-27: 75 mL via INTRAVENOUS

## 2012-03-03 ENCOUNTER — Other Ambulatory Visit (INDEPENDENT_AMBULATORY_CARE_PROVIDER_SITE_OTHER): Payer: Self-pay | Admitting: Otolaryngology

## 2012-03-06 ENCOUNTER — Ambulatory Visit (HOSPITAL_COMMUNITY)
Admission: RE | Admit: 2012-03-06 | Discharge: 2012-03-06 | Disposition: A | Discharge status: Home / Self Care | Payer: Medicare Other | Source: Ambulatory Visit | Attending: Otolaryngology | Admitting: Otolaryngology

## 2012-03-06 DIAGNOSIS — R0602 Shortness of breath: Secondary | ICD-10-CM | POA: Diagnosis not present

## 2012-03-06 DIAGNOSIS — R229 Localized swelling, mass and lump, unspecified: Secondary | ICD-10-CM | POA: Insufficient documentation

## 2012-03-06 DIAGNOSIS — R079 Chest pain, unspecified: Secondary | ICD-10-CM | POA: Diagnosis not present

## 2012-03-06 DIAGNOSIS — R9389 Abnormal findings on diagnostic imaging of other specified body structures: Secondary | ICD-10-CM | POA: Insufficient documentation

## 2012-03-06 DIAGNOSIS — R091 Pleurisy: Secondary | ICD-10-CM | POA: Diagnosis not present

## 2012-03-06 MED ORDER — IOHEXOL 300 MG/ML  SOLN
80.0000 mL | Freq: Once | INTRAMUSCULAR | Status: AC | PRN
  Administered 2012-03-06: 80 mL via INTRAVENOUS

## 2012-03-12 DIAGNOSIS — Z125 Encounter for screening for malignant neoplasm of prostate: Secondary | ICD-10-CM | POA: Diagnosis not present

## 2012-03-12 DIAGNOSIS — D4959 Neoplasm of unspecified behavior of other genitourinary organ: Secondary | ICD-10-CM | POA: Diagnosis not present

## 2012-03-12 DIAGNOSIS — N289 Disorder of kidney and ureter, unspecified: Secondary | ICD-10-CM | POA: Diagnosis not present

## 2012-03-13 ENCOUNTER — Other Ambulatory Visit: Payer: Self-pay | Admitting: Urology

## 2012-03-16 ENCOUNTER — Encounter: Payer: Self-pay | Admitting: Family Medicine

## 2012-04-14 ENCOUNTER — Encounter (HOSPITAL_COMMUNITY): Payer: Self-pay | Admitting: Pharmacy Technician

## 2012-04-15 NOTE — Patient Instructions (Signed)
21 Brandon Lawrence  04/15/2012   Your procedure is scheduled on: 04/22/12   Report to [facility] at 1015 AM.  Call this number if you have problems the morning of surgery: [phone]   Remember:   Do not eat food:After Midnight.  May have clear liquids:until Midnight .   Take these medicines the morning of surgery with A SIP OF WATER:   Do not wear jewelry,   Do not wear lotions, powders, or perfumes.  Men may shave face and neck.  Do not bring valuables to the hospital.  Contacts, dentures or bridgework may not be worn into surgery.  Leave suitcase in the car. After surgery it may be brought to your room.  For patients admitted to the hospital, checkout time is 11:00 AM the day of discharge.               SEE CHG INSTRUCTION SHEET    Please read over the following fact sheets that you were given: MRSA Information, coughing and deep breathing exercises, leg exercises, Blood Transfusion Fact Sheet             Failure to comply with these instructions may result in cancellation of your surgery.              Patient Signature _______________________________________________________             Nurse Signature _______________________________________________________

## 2012-04-16 ENCOUNTER — Encounter (HOSPITAL_COMMUNITY): Payer: Self-pay

## 2012-04-16 ENCOUNTER — Encounter (HOSPITAL_COMMUNITY)
Admission: RE | Admit: 2012-04-16 | Discharge: 2012-04-16 | Disposition: A | Discharge status: Home / Self Care | Payer: Medicare Other | Source: Ambulatory Visit | Attending: Urology | Admitting: Urology

## 2012-04-16 ENCOUNTER — Ambulatory Visit (INDEPENDENT_AMBULATORY_CARE_PROVIDER_SITE_OTHER): Payer: Medicare Other | Admitting: Otolaryngology

## 2012-04-16 HISTORY — DX: Unspecified osteoarthritis, unspecified site: M19.90

## 2012-04-16 HISTORY — DX: Shortness of breath: R06.02

## 2012-04-16 LAB — CBC
MCV: 87.6 fL (ref 78.0–100.0)
Platelets: 209 10*3/uL (ref 150–400)
RDW: 13.3 % (ref 11.5–15.5)
WBC: 8 10*3/uL (ref 4.0–10.5)

## 2012-04-16 LAB — COMPREHENSIVE METABOLIC PANEL
ALT: 17 U/L (ref 0–53)
Calcium: 9.4 mg/dL (ref 8.4–10.5)
Creatinine, Ser: 1.22 mg/dL (ref 0.50–1.35)
GFR calc Af Amer: 78 mL/min — ABNORMAL LOW (ref >90)
Glucose, Bld: 122 mg/dL — ABNORMAL HIGH (ref 70–99)
Sodium: 141 mEq/L (ref 135–145)
Total Protein: 7.2 g/dL (ref 6.0–8.3)

## 2012-04-16 LAB — SURGICAL PCR SCREEN: Staphylococcus aureus: NEGATIVE

## 2012-04-22 ENCOUNTER — Inpatient Hospital Stay (HOSPITAL_COMMUNITY)
Admission: RE | Admit: 2012-04-22 | Discharge: 2012-04-24 | DRG: 658 | Disposition: A | Discharge status: Home / Self Care | Payer: Medicare Other | Source: Ambulatory Visit | Attending: Urology | Admitting: Urology

## 2012-04-22 ENCOUNTER — Inpatient Hospital Stay (HOSPITAL_COMMUNITY): Payer: Medicare Other | Admitting: Anesthesiology

## 2012-04-22 ENCOUNTER — Encounter (HOSPITAL_COMMUNITY)
Admission: RE | Disposition: A | Discharge status: Home / Self Care | Payer: Self-pay | Source: Ambulatory Visit | Attending: Urology

## 2012-04-22 ENCOUNTER — Encounter (HOSPITAL_COMMUNITY): Payer: Self-pay | Admitting: *Deleted

## 2012-04-22 ENCOUNTER — Encounter (HOSPITAL_COMMUNITY): Payer: Self-pay | Admitting: Anesthesiology

## 2012-04-22 DIAGNOSIS — Z01812 Encounter for preprocedural laboratory examination: Secondary | ICD-10-CM | POA: Diagnosis not present

## 2012-04-22 DIAGNOSIS — C649 Malignant neoplasm of unspecified kidney, except renal pelvis: Secondary | ICD-10-CM | POA: Diagnosis not present

## 2012-04-22 DIAGNOSIS — K219 Gastro-esophageal reflux disease without esophagitis: Secondary | ICD-10-CM | POA: Diagnosis present

## 2012-04-22 DIAGNOSIS — K660 Peritoneal adhesions (postprocedural) (postinfection): Secondary | ICD-10-CM | POA: Diagnosis present

## 2012-04-22 DIAGNOSIS — N289 Disorder of kidney and ureter, unspecified: Secondary | ICD-10-CM | POA: Diagnosis not present

## 2012-04-22 HISTORY — PX: LAPAROSCOPIC LYSIS OF ADHESIONS: SHX5905

## 2012-04-22 HISTORY — PX: ROBOTIC ASSITED PARTIAL NEPHRECTOMY: SHX6087

## 2012-04-22 LAB — TYPE AND SCREEN
ABO/RH(D): A POS
Antibody Screen: NEGATIVE

## 2012-04-22 LAB — BASIC METABOLIC PANEL
GFR calc Af Amer: 80 mL/min — ABNORMAL LOW (ref >90)
GFR calc non Af Amer: 69 mL/min — ABNORMAL LOW (ref >90)
Glucose, Bld: 135 mg/dL — ABNORMAL HIGH (ref 70–99)
Potassium: 4.6 mEq/L (ref 3.5–5.1)
Sodium: 134 mEq/L — ABNORMAL LOW (ref 135–145)

## 2012-04-22 LAB — ABO/RH: ABO/RH(D): A POS

## 2012-04-22 LAB — HEMOGLOBIN AND HEMATOCRIT, BLOOD: Hemoglobin: 12.1 g/dL — ABNORMAL LOW (ref 13.0–17.0)

## 2012-04-22 SURGERY — ROBOTIC ASSITED PARTIAL NEPHRECTOMY
Anesthesia: General | Wound class: Clean

## 2012-04-22 MED ORDER — CEFAZOLIN SODIUM-DEXTROSE 2-3 GM-% IV SOLR
2.0000 g | INTRAVENOUS | Status: AC
  Administered 2012-04-22: 2 g via INTRAVENOUS

## 2012-04-22 MED ORDER — BUPIVACAINE LIPOSOME 1.3 % IJ SUSP
INTRAMUSCULAR | Status: DC | PRN
  Administered 2012-04-22: 20 mL

## 2012-04-22 MED ORDER — OXYCODONE HCL 5 MG PO TABS
5.0000 mg | ORAL_TABLET | ORAL | Status: DC | PRN
  Administered 2012-04-23 – 2012-04-24 (×7): 5 mg via ORAL
  Filled 2012-04-22 (×7): qty 1

## 2012-04-22 MED ORDER — CHLORDIAZEPOXIDE-AMITRIPTYLINE 5-12.5 MG PO TABS
1.0000 | ORAL_TABLET | Freq: Every day | ORAL | Status: DC
  Administered 2012-04-23 – 2012-04-24 (×2): 1 via ORAL
  Filled 2012-04-22 (×2): qty 1

## 2012-04-22 MED ORDER — FENTANYL CITRATE 0.05 MG/ML IJ SOLN
INTRAMUSCULAR | Status: DC | PRN
  Administered 2012-04-22 (×3): 50 ug via INTRAVENOUS
  Administered 2012-04-22: 100 ug via INTRAVENOUS
  Administered 2012-04-22: 50 ug via INTRAVENOUS
  Administered 2012-04-22: 100 ug via INTRAVENOUS
  Administered 2012-04-22: 50 ug via INTRAVENOUS
  Administered 2012-04-22: 100 ug via INTRAVENOUS
  Administered 2012-04-22: 50 ug via INTRAVENOUS

## 2012-04-22 MED ORDER — PANTOPRAZOLE SODIUM 40 MG PO TBEC
40.0000 mg | DELAYED_RELEASE_TABLET | Freq: Every day | ORAL | Status: DC
  Administered 2012-04-22 – 2012-04-24 (×3): 40 mg via ORAL
  Filled 2012-04-22 (×3): qty 1

## 2012-04-22 MED ORDER — MANNITOL 25 % IV SOLN
25.0000 g | Freq: Once | INTRAVENOUS | Status: AC
  Administered 2012-04-22: 25 g via INTRAVENOUS
  Filled 2012-04-22: qty 100

## 2012-04-22 MED ORDER — STERILE WATER FOR IRRIGATION IR SOLN
Status: DC | PRN
  Administered 2012-04-22: 3000 mL

## 2012-04-22 MED ORDER — LACTATED RINGERS IR SOLN
Status: DC | PRN
  Administered 2012-04-22: 1000 mL

## 2012-04-22 MED ORDER — ACETAMINOPHEN 10 MG/ML IV SOLN
INTRAVENOUS | Status: DC | PRN
  Administered 2012-04-22: 1000 mg via INTRAVENOUS

## 2012-04-22 MED ORDER — HYDROMORPHONE HCL PF 1 MG/ML IJ SOLN
0.2500 mg | INTRAMUSCULAR | Status: DC | PRN
  Administered 2012-04-22 (×2): 0.5 mg via INTRAVENOUS

## 2012-04-22 MED ORDER — MIDAZOLAM HCL 5 MG/5ML IJ SOLN
INTRAMUSCULAR | Status: DC | PRN
  Administered 2012-04-22: 1 mg via INTRAVENOUS
  Administered 2012-04-22: 2 mg via INTRAVENOUS

## 2012-04-22 MED ORDER — LACTATED RINGERS IV SOLN
INTRAVENOUS | Status: DC
  Administered 2012-04-22: 16:00:00 via INTRAVENOUS
  Administered 2012-04-22: 1000 mL via INTRAVENOUS
  Administered 2012-04-22: 17:00:00 via INTRAVENOUS

## 2012-04-22 MED ORDER — LACTATED RINGERS IV SOLN: INTRAVENOUS | Status: DC

## 2012-04-22 MED ORDER — NEOSTIGMINE METHYLSULFATE 1 MG/ML IJ SOLN
INTRAMUSCULAR | Status: DC | PRN
  Administered 2012-04-22: 4 mg via INTRAVENOUS

## 2012-04-22 MED ORDER — ROCURONIUM BROMIDE 100 MG/10ML IV SOLN
INTRAVENOUS | Status: DC | PRN
  Administered 2012-04-22: 50 mg via INTRAVENOUS
  Administered 2012-04-22: 10 mg via INTRAVENOUS
  Administered 2012-04-22 (×2): 20 mg via INTRAVENOUS

## 2012-04-22 MED ORDER — SENNA 8.6 MG PO TABS
1.0000 | ORAL_TABLET | Freq: Two times a day (BID) | ORAL | Status: DC
  Administered 2012-04-22 – 2012-04-24 (×4): 8.6 mg via ORAL
  Filled 2012-04-22 (×3): qty 1

## 2012-04-22 MED ORDER — LABETALOL HCL 5 MG/ML IV SOLN
INTRAVENOUS | Status: DC | PRN
  Administered 2012-04-22 (×3): 5 mg via INTRAVENOUS

## 2012-04-22 MED ORDER — HYDROMORPHONE HCL PF 1 MG/ML IJ SOLN
INTRAMUSCULAR | Status: DC | PRN
  Administered 2012-04-22 (×2): 0.5 mg via INTRAVENOUS

## 2012-04-22 MED ORDER — LIDOCAINE HCL (CARDIAC) 20 MG/ML IV SOLN
INTRAVENOUS | Status: DC | PRN
  Administered 2012-04-22: 80 mg via INTRAVENOUS

## 2012-04-22 MED ORDER — HYDRALAZINE HCL 20 MG/ML IJ SOLN
INTRAMUSCULAR | Status: DC | PRN
  Administered 2012-04-22: 2.5 mg via INTRAVENOUS

## 2012-04-22 MED ORDER — PROPOFOL 10 MG/ML IV BOLUS
INTRAVENOUS | Status: DC | PRN
  Administered 2012-04-22: 200 mg via INTRAVENOUS
  Administered 2012-04-22: 50 mg via INTRAVENOUS

## 2012-04-22 MED ORDER — KCL IN DEXTROSE-NACL 10-5-0.45 MEQ/L-%-% IV SOLN
INTRAVENOUS | Status: DC
  Administered 2012-04-22 – 2012-04-24 (×3): via INTRAVENOUS
  Filled 2012-04-22 (×6): qty 1000

## 2012-04-22 MED ORDER — ACETAMINOPHEN 10 MG/ML IV SOLN
1000.0000 mg | Freq: Four times a day (QID) | INTRAVENOUS | Status: AC
  Administered 2012-04-22 – 2012-04-23 (×4): 1000 mg via INTRAVENOUS
  Filled 2012-04-22 (×5): qty 100

## 2012-04-22 MED ORDER — MEPERIDINE HCL 50 MG/ML IJ SOLN: 6.2500 mg | INTRAMUSCULAR | Status: DC | PRN

## 2012-04-22 MED ORDER — BUPIVACAINE LIPOSOME 1.3 % IJ SUSP
20.0000 mL | Freq: Once | INTRAMUSCULAR | Status: DC
  Filled 2012-04-22: qty 20

## 2012-04-22 MED ORDER — DOCUSATE SODIUM 100 MG PO CAPS
100.0000 mg | ORAL_CAPSULE | Freq: Two times a day (BID) | ORAL | Status: DC
  Administered 2012-04-22 – 2012-04-24 (×4): 100 mg via ORAL
  Filled 2012-04-22 (×6): qty 1

## 2012-04-22 MED ORDER — PROMETHAZINE HCL 25 MG/ML IJ SOLN: 6.2500 mg | INTRAMUSCULAR | Status: DC | PRN

## 2012-04-22 MED ORDER — HYDROMORPHONE HCL PF 1 MG/ML IJ SOLN
INTRAMUSCULAR | Status: AC
  Filled 2012-04-22: qty 1

## 2012-04-22 MED ORDER — GLYCOPYRROLATE 0.2 MG/ML IJ SOLN
INTRAMUSCULAR | Status: DC | PRN
  Administered 2012-04-22: 0.6 mg via INTRAVENOUS

## 2012-04-22 MED ORDER — ONDANSETRON HCL 4 MG/2ML IJ SOLN
INTRAMUSCULAR | Status: DC | PRN
  Administered 2012-04-22: 4 mg via INTRAVENOUS

## 2012-04-22 MED ORDER — HYDROMORPHONE HCL PF 1 MG/ML IJ SOLN
0.5000 mg | INTRAMUSCULAR | Status: DC | PRN
  Administered 2012-04-22 – 2012-04-23 (×3): 1 mg via INTRAVENOUS
  Administered 2012-04-23: 0.5 mg via INTRAVENOUS
  Administered 2012-04-24 (×3): 1 mg via INTRAVENOUS
  Filled 2012-04-22 (×8): qty 1

## 2012-04-22 MED ORDER — ONDANSETRON HCL 4 MG/2ML IJ SOLN
4.0000 mg | INTRAMUSCULAR | Status: DC | PRN
  Administered 2012-04-23 – 2012-04-24 (×3): 4 mg via INTRAVENOUS
  Filled 2012-04-22 (×3): qty 2

## 2012-04-22 SURGICAL SUPPLY — 76 items
ADH SKN CLS APL DERMABOND .7 (GAUZE/BANDAGES/DRESSINGS) ×2
APL ESCP 34 STRL LF DISP (HEMOSTASIS)
APPLICATOR SURGIFLO ENDO (HEMOSTASIS) IMPLANT
BAG SPEC RTRVL LRG 6X4 10 (ENDOMECHANICALS) ×4
CANNULA SEAL DVNC (CANNULA) ×6 IMPLANT
CANNULA SEALS DA VINCI (CANNULA) ×3
CHLORAPREP W/TINT 26ML (MISCELLANEOUS) ×3 IMPLANT
CLIP LIGATING HEM O LOK PURPLE (MISCELLANEOUS) ×1 IMPLANT
CLIP LIGATING HEMO LOK XL GOLD (MISCELLANEOUS) ×5 IMPLANT
CLIP LIGATING HEMO O LOK GREEN (MISCELLANEOUS) ×4 IMPLANT
CLOTH BEACON ORANGE TIMEOUT ST (SAFETY) ×3 IMPLANT
CORD HIGH FREQUENCY UNIPOLAR (ELECTROSURGICAL) ×1 IMPLANT
CORDS BIPOLAR (ELECTRODE) ×3 IMPLANT
COVER SURGICAL LIGHT HANDLE (MISCELLANEOUS) ×3 IMPLANT
COVER TIP SHEARS 8 DVNC (MISCELLANEOUS) ×2 IMPLANT
COVER TIP SHEARS 8MM DA VINCI (MISCELLANEOUS) ×1
CUTTER FLEX LINEAR 45M (STAPLE) ×2 IMPLANT
DECANTER SPIKE VIAL GLASS SM (MISCELLANEOUS) ×3 IMPLANT
DERMABOND ADVANCED (GAUZE/BANDAGES/DRESSINGS) ×1
DERMABOND ADVANCED .7 DNX12 (GAUZE/BANDAGES/DRESSINGS) ×4 IMPLANT
DRAIN CHANNEL 15F RND FF 3/16 (WOUND CARE) ×3 IMPLANT
DRAPE INCISE IOBAN 66X45 STRL (DRAPES) ×3 IMPLANT
DRAPE LAPAROSCOPIC ABDOMINAL (DRAPES) ×3 IMPLANT
DRAPE LG THREE QUARTER DISP (DRAPES) ×6 IMPLANT
DRAPE TABLE BACK 44X90 PK DISP (DRAPES) ×3 IMPLANT
DRAPE WARM FLUID 44X44 (DRAPE) ×3 IMPLANT
ELECT REM PT RETURN 9FT ADLT (ELECTROSURGICAL) ×3
ELECTRODE REM PT RTRN 9FT ADLT (ELECTROSURGICAL) ×4 IMPLANT
EVACUATOR SILICONE 100CC (DRAIN) ×3 IMPLANT
GAUZE VASELINE 3X9 (GAUZE/BANDAGES/DRESSINGS) IMPLANT
GLOVE BIO SURGEON STRL SZ 6.5 (GLOVE) ×3 IMPLANT
GLOVE BIOGEL M STRL SZ7.5 (GLOVE) ×7 IMPLANT
GOWN STRL NON-REIN LRG LVL3 (GOWN DISPOSABLE) ×8 IMPLANT
GOWN STRL REIN XL XLG (GOWN DISPOSABLE) ×5 IMPLANT
KIT ACCESSORY DA VINCI DISP (KITS) ×1
KIT ACCESSORY DVNC DISP (KITS) ×2 IMPLANT
KIT BASIN OR (CUSTOM PROCEDURE TRAY) ×3 IMPLANT
LOOP VESSEL MAXI BLUE (MISCELLANEOUS) ×3 IMPLANT
NDL INSUFFLATION 14GA 120MM (NEEDLE) ×2 IMPLANT
NEEDLE INSUFFLATION 14GA 120MM (NEEDLE) ×3 IMPLANT
NS IRRIG 1000ML POUR BTL (IV SOLUTION) ×3 IMPLANT
PENCIL BUTTON HOLSTER BLD 10FT (ELECTRODE) ×3 IMPLANT
POSITIONER SURGICAL ARM (MISCELLANEOUS) ×6 IMPLANT
POUCH SPECIMEN RETRIEVAL 10MM (ENDOMECHANICALS) ×4 IMPLANT
RELOAD 45 VASCULAR/THIN (ENDOMECHANICALS) IMPLANT
RELOAD STAPLE 45 2.5 WHT GRN (ENDOMECHANICALS) ×6 IMPLANT
RELOAD WHITE ECR60W (STAPLE) ×1 IMPLANT
SET TUBE IRRIG SUCTION NO TIP (IRRIGATION / IRRIGATOR) ×1 IMPLANT
SOLUTION ANTI FOG 6CC (MISCELLANEOUS) ×3 IMPLANT
SOLUTION ELECTROLUBE (MISCELLANEOUS) ×3 IMPLANT
SPONGE LAP 18X18 X RAY DECT (DISPOSABLE) ×2 IMPLANT
STAPLE ECHEON FLEX 60 POW ENDO (STAPLE) ×1 IMPLANT
SURGIFLO W/THROMBIN 8M KIT (HEMOSTASIS) ×4 IMPLANT
SUT ETHILON 3 0 PS 1 (SUTURE) ×3 IMPLANT
SUT MNCRL AB 4-0 PS2 18 (SUTURE) ×6 IMPLANT
SUT PDS AB 1 TP1 54 (SUTURE) ×4 IMPLANT
SUT PDS AB 1 TP1 96 (SUTURE) ×2 IMPLANT
SUT V-LOC BARB 180 2/0GR6 GS22 (SUTURE) ×6
SUT V-LOC BARB 180 2/0GR9 GS23 (SUTURE)
SUT VIC AB 0 CT1 27 (SUTURE) ×6
SUT VIC AB 0 CT1 27XBRD ANTBC (SUTURE) ×4 IMPLANT
SUT VICRYL 0 UR6 27IN ABS (SUTURE) ×6 IMPLANT
SUT VLOC BARB 180 ABS3/0GR12 (SUTURE) ×3
SUTURE V-LC BRB 180 2/0GR6GS22 (SUTURE) IMPLANT
SUTURE V-LC BRB 180 2/0GR9GS23 (SUTURE) IMPLANT
SUTURE VLOC BRB 180 ABS3/0GR12 (SUTURE) ×2 IMPLANT
SYR BULB IRRIGATION 50ML (SYRINGE) IMPLANT
TOWEL OR NON WOVEN STRL DISP B (DISPOSABLE) ×6 IMPLANT
TRAY FOLEY CATH 14FRSI W/METER (CATHETERS) ×3 IMPLANT
TRAY LAP CHOLE (CUSTOM PROCEDURE TRAY) ×3 IMPLANT
TROCAR 12M 150ML BLUNT (TROCAR) ×1 IMPLANT
TROCAR BLADELESS OPT 5 75 (ENDOMECHANICALS) ×1 IMPLANT
TROCAR ENDOPATH XCEL 12X100 BL (ENDOMECHANICALS) ×3 IMPLANT
TROCAR XCEL 12X100 BLDLESS (ENDOMECHANICALS) ×3 IMPLANT
TUBING INSUFFLATION 10FT LAP (TUBING) ×3 IMPLANT
WATER STERILE IRR 1500ML POUR (IV SOLUTION) ×4 IMPLANT

## 2012-04-22 NOTE — Preoperative (Signed)
Beta Blockers   Reason not to administer Beta Blockers:Not Applicable 

## 2012-04-22 NOTE — Transfer of Care (Signed)
Immediate Anesthesia Transfer of Care Note  Patient: Brandon Lawrence  Procedure(s) Performed: Procedure(s) (LRB) with comments: ROBOTIC ASSITED PARTIAL NEPHRECTOMY (Left) LAPAROSCOPIC LYSIS OF ADHESIONS (N/A) - Extensive lysis of adhesions  Patient Location: PACU  Anesthesia Type:General  Level of Consciousness: awake, sedated and patient cooperative  Airway & Oxygen Therapy: Patient Spontanous Breathing and Patient connected to face mask oxygen  Post-op Assessment: Report given to PACU RN, Post -op Vital signs reviewed and stable and Patient moving all extremities X 4  Post vital signs: stable  Complications: No apparent anesthesia complications

## 2012-04-22 NOTE — Anesthesia Postprocedure Evaluation (Signed)
  Anesthesia Post-op Note  Patient: Brandon Lawrence  Procedure(s) Performed: Procedure(s) (LRB): ROBOTIC ASSITED PARTIAL NEPHRECTOMY (Left) LAPAROSCOPIC LYSIS OF ADHESIONS (N/A)  Patient Location: PACU  Anesthesia Type: General  Level of Consciousness: awake and alert   Airway and Oxygen Therapy: Patient Spontanous Breathing  Post-op Pain: mild  Post-op Assessment: Post-op Vital signs reviewed, Patient's Cardiovascular Status Stable, Respiratory Function Stable, Patent Airway and No signs of Nausea or vomiting  Last Vitals:  Filed Vitals:   04/22/12 1815  BP: 155/80  Pulse: 64  Temp: 36.6 C  Resp: 12    Post-op Vital Signs: stable   Complications: No apparent anesthesia complications

## 2012-04-22 NOTE — Brief Op Note (Signed)
04/22/2012  4:53 PM  PATIENT:  Brandon Lawrence  51 y.o. male  PRE-OPERATIVE DIAGNOSIS:  LEFT RENAL MASS  POST-OPERATIVE DIAGNOSIS:  LEFT RENAL MASS  PROCEDURE:  Procedure(s) (LRB) with comments: ROBOTIC ASSITED PARTIAL NEPHRECTOMY (Left) LAPAROSCOPIC LYSIS OF ADHESIONS (N/A) - Extensive lysis of adhesions  SURGEON:  Surgeon(s) and Role:    * Sebastian Ache, MD - Primary  PHYSICIAN ASSISTANT: Lujean Rave, PA  ASSISTANTS: none   ANESTHESIA:   general  EBL:  Total I/O In: 2000 [I.V.:2000] Out: 280 [Urine:80; Blood:200]  BLOOD ADMINISTERED:none  DRAINS: (1) Jackson-Pratt drain(s) with closed bulb suction in the LLQ   LOCAL MEDICATIONS USED:  MARCAINE     SPECIMEN:  Source of Specimen:  1 - Left Renal Mass, 2 - Base of Left Renal Mass  DISPOSITION OF SPECIMEN:  PATHOLOGY  COUNTS:  YES  TOURNIQUET:  * No tourniquets in log *  DICTATION: .Other Dictation: Dictation Number [id number]  PLAN OF CARE: Admit to inpatient   PATIENT DISPOSITION:  PACU - hemodynamically stable.   Delay start of Pharmacological VTE agent (>24hrs) due to surgical blood loss or risk of bleeding: yes
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
## 2012-04-22 NOTE — Anesthesia Preprocedure Evaluation (Addendum)
Anesthesia Evaluation  Patient identified by MRN, date of birth, ID band Patient awake    Reviewed: Allergy & Precautions, H&P , NPO status , Patient's Chart, lab work & pertinent test results  Airway Mallampati: II TM Distance: >3 FB Neck ROM: Full    Dental No notable dental hx.    Pulmonary neg pulmonary ROS, former smoker,  breath sounds clear to auscultation  Pulmonary exam normal       Cardiovascular negative cardio ROS  Rhythm:Regular Rate:Normal     Neuro/Psych negative neurological ROS  negative psych ROS   GI/Hepatic negative GI ROS, Neg liver ROS, GERD-  Medicated and Controlled,  Endo/Other  negative endocrine ROS  Renal/GU negative Renal ROS  negative genitourinary   Musculoskeletal negative musculoskeletal ROS (+)   Abdominal   Peds negative pediatric ROS (+)  Hematology negative hematology ROS (+)   Anesthesia Other Findings   Reproductive/Obstetrics negative OB ROS                           Anesthesia Physical Anesthesia Plan  ASA: II  Anesthesia Plan: General   Post-op Pain Management:    Induction: Intravenous  Airway Management Planned: Oral ETT  Additional Equipment:   Intra-op Plan:   Post-operative Plan: Extubation in OR  Informed Consent: I have reviewed the patients History and Physical, chart, labs and discussed the procedure including the risks, benefits and alternatives for the proposed anesthesia with the patient or authorized representative who has indicated his/her understanding and acceptance.   Dental advisory given  Plan Discussed with: CRNA  Anesthesia Plan Comments:         Anesthesia Quick Evaluation

## 2012-04-23 ENCOUNTER — Encounter (HOSPITAL_COMMUNITY): Payer: Self-pay | Admitting: Registered Nurse

## 2012-04-23 ENCOUNTER — Encounter (HOSPITAL_COMMUNITY): Payer: Self-pay | Admitting: Urology

## 2012-04-23 DIAGNOSIS — C649 Malignant neoplasm of unspecified kidney, except renal pelvis: Secondary | ICD-10-CM | POA: Diagnosis not present

## 2012-04-23 LAB — BASIC METABOLIC PANEL
Calcium: 8.5 mg/dL (ref 8.4–10.5)
GFR calc non Af Amer: 65 mL/min — ABNORMAL LOW (ref >90)
Sodium: 135 mEq/L (ref 135–145)

## 2012-04-23 LAB — HEMOGLOBIN AND HEMATOCRIT, BLOOD: HCT: 34.8 % — ABNORMAL LOW (ref 39.0–52.0)

## 2012-04-23 MED ORDER — PROMETHAZINE HCL 25 MG/ML IJ SOLN
12.5000 mg | Freq: Four times a day (QID) | INTRAMUSCULAR | Status: DC | PRN
Start: 2012-04-23 — End: 2012-04-24
  Administered 2012-04-23 – 2012-04-24 (×2): 12.5 mg via INTRAVENOUS
  Filled 2012-04-23 (×3): qty 1

## 2012-04-23 NOTE — Op Note (Signed)
NAMEMarland Kitchen  Brandon Lawrence, Brandon Lawrence NO.:  [id number]  MEDICAL RECORD NO.:  [id number]  LOCATION:  1431                         FACILITY:  [facility]  PHYSICIAN:  Sebastian Ache, MD     DATE OF BIRTH:  12-Feb-1961  DATE OF PROCEDURE:  04/22/2012 DATE OF DISCHARGE:                              OPERATIVE REPORT   DIAGNOSES: 1. Left upper pole renal mass. 2. History of exploratory laparotomy.  PROCEDURES: 1. Extensive laparoscopic adhesiolysis. 2. Robotic-assisted laparoscopic left partial nephrectomy.  ESTIMATED BLOOD LOSS:  150 mL.  SPECIMENS: 1. Left renal mass. 2. Base of left renal mass.  FINDINGS: 1. Very extensive adhesions involving small bowel to the anterior     abdominal wall as well as the omentum to the anterior abdominal     wall, required very careful extensive adhesiolysis. 2. One-artery, one-vein left renovascular anatomy. 3. Solid-appearing exophytic left upper pole mass on the kidney.  ASSISTANT:  Pecola Leisure, PA  WARM ISCHEMIA TIME: 10 minutes  COMPLICATIONS:  None.  INDICATION:  Brandon Lawrence is a pleasant 51 year old gentleman who was found on axial imaging to have a left upper pole worrisome renal mass, this was corroborated on MRI worrisome for renal cell carcinoma. Options were discussed with the patient including observation versus nephrectomy versus partial nephrectomy versus ablative therapy, and he wished to proceed with partial nephrectomy with minimally invasive assistance.  Notably, the patient has history of exploratory laparotomy and has large midline incision previously.  We had previously discussed the implications of this with possible anterior abdominal wall adhesions.  Informed consent was obtained and placed in the medical record.  PROCEDURE IN DETAIL:  The patient being Brandon Lawrence, was verified. Procedure being left robotic partial nephrectomy was confirmed. Procedure was carried out.  Time-out was performed.   Intravenous antibiotics were administered.  General endotracheal anesthesia was introduced.  The patient was placed into a left side up with full flank position of applying 15 degrees of stable flexion, axillary roll, superior arm elevator, sequential compression devices.  The patient's bottom leg was bent and top leg straight.  Bean bag was used to further fashioned the upper table using 3-inch tape over padding.  Sterile field was created by prepping and draping the patient's entire abdomen and left flank using chlorhexidine gluconate.  Next, a high-flow low pressure pneumoperitoneum was easily obtained using Veress technique and the left lower quadrant having passed the aspiration and drop test. Next, a 12-mm robotic camera port was inserted 6 cm lateral to the midline little away from the previous midline incision.  Laparoscopic examination of the peritoneal cavity revealed very extensive adhesions of small bowel to the anterior abdominal wall as well as very extensive omental adhesions to the anterior abdominal wall.  A single 8-mm robotic port was placed superiorly under direct vision and using cold scissors, laparoscopic adhesiolysis was performed allowing development of working space, it was performed using only cold scissors taking great care to avoid bowel injury, which did not occur.  This allowed replacement of the second port inferiorly, and 8-mm robotic port. Adhesiolysis was continued to perform through this port creating further working space.  Notably, the omental adhesions were somewhat dense and  felt that robotic adhesiolysis at this part would be best.  Additional 8- mm robotic port was placed in the lateral aspect of the abdomen approximately 2 fingerbreadths superior medial to the anterior superior iliac spine.  At this point, a separate 5-mm laparoscopic camera was used to visualize the entry port via the more lateral port and there was no obvious visceral injury  seen along with this tract using an optimum visualization from the lateral port.  A 12-mm assistant port was placed in the midline between the camera port and inferior robotic port.  A 5- mm assistant port was placed in the midline between the robotic camera port and superior.  Robot was then docked and continued adhesiolysis was carefully performed of omentum in the left upper quadrant, and very carefully removed and sending away from the anterior abdominal wall. This provided excellent working space.  Next, incision was made lateral to the descending colon from the area of the splenic flexure towards the area of the internal ring.  This was carefully mobilized medially.  The lower pole of the kidney was seen, placed on gentle lateral traction. Dissection proceeded medially.  The ureter was encountered and also placed on very gentle lateral traction.  Psoas muscle was seen. Dissection was then proceeded superiorly within this triangle posterior to the renal hilum.  The renal hilum was encounter consisted with single artery and single vein.  Vessel loop was placed on the artery.  Very careful mobilization of the superior pole of the kidney and lateral aspect was then performed and new rotation such that the area of the tumor was easily seen, it was found to be exophytic and solid-appearing worrisome for neoplasm.  The operative field, ports, and mass appeared amendable to partial nephrectomy. Renorrhaphy sutures were preplaced.  Next, warm ischemia was performed with single clamping of the artery, single clamping of the vein. Partial nephrectomy was then carefully performed using cold scissors under warm ischemia keeping what appeared to be a rim of normal renal parenchyma with a tumor specimen.  This was set aside for permanent pathology.  A separate base was set aside for permanent pathology. Next, renorrhaphy was performed first using a 3-0 running V-Loc suture of any collecting system  entrance into the small vessels.  The hilum was then unclamped for total warm ischemia time of 10 minutes.  Additional renorrhaphy was performed using a second 2-0 Vicryl with parenchymal apposition sutures as same, which between Hem-O-Lok clips.  Procedure was performed x2, resulted in excellent hemostasis and parenchymal apposition.  The tumor specimen was placed into an EndoCatch bag for later retrieval.  The abdomen was once again inspected, and hemostasis was found to be excellent.  No visceral injury was seen.  All sponge and needle counts were correct. The vessel loop was removed and inspected and intact. Closed suction drain was brought to the level of retroperitoneum near the lateral most robotic port site.  Robot was then undocked.  Specimen was retrieved by extending the assistant port site prior to the condition approximately 3 cm removing in its entirety.  The retrieval site was closed at the level of the fascia using figure-of-eight 0 Vicryl x2.  Previous camera port was similarly closed using figure-of-eight Vicryl x1.  All skin incisions were reapproximated using subcuticular Monocryl followed by Dermabond.  Procedure was then terminated.  The patient tolerated the procedure well.  There were no immediate periprocedural complications.  The patient was taken to postanesthesia care unit in stable condition.  ______________________________ Sebastian Ache, MD     TM/MEDQ  D:  04/22/2012  T:  04/23/2012  Job:  [id number]

## 2012-04-23 NOTE — Progress Notes (Signed)
1 Day Post-Op  Subjective:  1 - Left Renal Mass - POD1 s/p robotic assisted laparoscopic left partial nephrectomy with extensive adhesiolysis for worrisome upper pole mass. Path pending.  No events overnight. Pain controlled. No n/v. Hgb and Cr acceptable. JP output minimal relative to urine.  Objective: Vital signs in last 24 hours: Temp:  [97.7 F (36.5 C)-98.3 F (36.8 C)] 97.9 F (36.6 C) (12/19 0548) Pulse Rate:  [53-74] 66  (12/19 0548) Resp:  [12-18] 18  (12/19 0548) BP: (125-173)/(61-105) 152/69 mmHg (12/19 0548) SpO2:  [100 %] 100 % (12/19 0548) Weight:  [69.31 kg (152 lb 12.8 oz)] 69.31 kg (152 lb 12.8 oz) (12/18 1843) Last BM Date: 04/22/12  Intake/Output from previous day: 12/18 0701 - 12/19 0700 In: 4000 [I.V.:3700; IV Piggyback:300] Out: 1130 [Urine:805; Drains:125; Blood:200] Intake/Output this shift: Total I/O In: 1000 [I.V.:700; IV Piggyback:300] Out: 525 [Urine:450; Drains:75]  General appearance: alert, cooperative and appears stated age Head: Normocephalic, without obvious abnormality, atraumatic Eyes: conjunctivae/corneas clear. PERRL, EOM's intact. Fundi benign. Ears: normal TM's and external ear canals both ears Nose: Nares normal. Septum midline. Mucosa normal. No drainage or sinus tenderness. Throat: lips, mucosa, and tongue normal; teeth and gums normal Back: symmetric, no curvature. ROM normal. No CVA tenderness. Resp: clear to auscultation bilaterally Chest wall: no tenderness Cardio: regular rate and rhythm, S1, S2 normal, no murmur, click, rub or gallop GI: soft, non-tender; bowel sounds normal; no masses,  no organomegaly Male genitalia: normal Extremities: extremities normal, atraumatic, no cyanosis or edema Pulses: 2+ and symmetric Skin: Skin color, texture, turgor normal. No rashes or lesions Lymph nodes: Cervical, supraclavicular, and axillary nodes normal. Neurologic: Grossly normal Incision/Wound: Foley c/d/i with yellow urine. JP  with scant serous drainage. All port sites c/d/i no drainage or erythema.  Lab Results:   Basename 04/23/12 0450 04/22/12 1745  WBC -- --  HGB 12.1* 12.1*  HCT 34.8* 34.5*  PLT -- --   BMET  Basename 04/23/12 0450 04/22/12 1745  NA 135 134*  K 4.2 4.6  CL 102 101  CO2 24 24  GLUCOSE 148* 135*  BUN 13 16  CREATININE 1.26 1.20  CALCIUM 8.5 8.4   PT/INR No results found for this basename: LABPROT:2,INR:2 in the last 72 hours ABG No results found for this basename: PHART:2,PCO2:2,PO2:2,HCO3:2 in the last 72 hours  Studies/Results: No results found.  Anti-infectives: Anti-infectives     Start     Dose/Rate Route Frequency Ordered Stop   04/22/12 0953   ceFAZolin (ANCEF) IVPB 2 g/50 mL premix        2 g 100 mL/hr over 30 Minutes Intravenous 30 min pre-op 04/22/12 0953 04/22/12 1316          Assessment/Plan:  1 - Left Renal Mass - Doing very well post-op  -DC Foley -DC JP drain - Ambulate (will need cane or walker as per baseline) - Possible DC in PM today v. Tomorrow based on current progress.  [facility], Avelino Herren 04/23/2012

## 2012-04-23 NOTE — Progress Notes (Signed)
Nutrition Brief Note  Patient identified on the Malnutrition Screening Tool (MST) Report  Body mass index is 24.66 kg/(m^2). Pt meets criteria for wnl based on current BMI.   Current diet order is Regular, patient is consuming approximately 25% of meals at this time. Labs and medications reviewed.   Pt sleepy at time of visit, somewhat difficult to follow conversation, but states that his 'stomach hasn't wanted food recently.'  RD clarifies that his is right before admission.  Pt wt is stable, consistent with usual wt.  Pt has been eating 25% of meals since surgery yesterday.  No nutrition interventions warranted at this time. If nutrition issues arise, please consult RD. Will follow for improvement in intake.    Loyce Dys, MS RD LDN Clinical Inpatient Dietitian Pager: [phone] Weekend/After hours pager: [phone]

## 2012-04-23 NOTE — Progress Notes (Signed)
   CARE MANAGEMENT NOTE 04/23/2012  Patient:  Brandon Lawrence, Brandon Lawrence   Account Number:  [id number]  Date Initiated:  04/23/2012  Documentation initiated by:  Jiles Crocker  Subjective/Objective Assessment:   ADMITTED FOR SURGERY- robotic assisted laparoscopic left partial nephrectomy with extensive adhesiolysis     Action/Plan:   LIVES AT HOME WITH FAMILY MEMBERS; CM WILL CONTINUE TO FOLLOW FOR DCP   Anticipated DC Date:  04/26/2012   Anticipated DC Plan:  HOME/SELF CARE      DC Planning Services  CM consult         Status of service:  In process, will continue to follow Medicare Important Message given?  NA - LOS <3 / Initial given by admissions (If response is "NO", the following Medicare IM given date fields will be blank)  Per UR Regulation:  Reviewed for med. necessity/level of care/duration of stay  Comments:  04/23/2012- B Kyasia Steuck RN,BSN,MHA

## 2012-04-24 LAB — HEMOGLOBIN AND HEMATOCRIT, BLOOD
HCT: 35.4 % — ABNORMAL LOW (ref 39.0–52.0)
Hemoglobin: 12.4 g/dL — ABNORMAL LOW (ref 13.0–17.0)

## 2012-04-24 LAB — COMPREHENSIVE METABOLIC PANEL
AST: 27 U/L (ref 0–37)
Albumin: 3.1 g/dL — ABNORMAL LOW (ref 3.5–5.2)
Calcium: 8.9 mg/dL (ref 8.4–10.5)
Creatinine, Ser: 1.25 mg/dL (ref 0.50–1.35)
GFR calc non Af Amer: 66 mL/min — ABNORMAL LOW (ref >90)

## 2012-04-24 MED ORDER — OXYCODONE HCL 5 MG PO TABS: 5.0000 mg | ORAL_TABLET | ORAL | Status: DC | PRN

## 2012-04-24 MED ORDER — ACETAMINOPHEN 500 MG PO TABS
1000.0000 mg | ORAL_TABLET | Freq: Three times a day (TID) | ORAL | Status: DC | PRN
  Administered 2012-04-24: 1000 mg via ORAL
  Filled 2012-04-24: qty 2

## 2012-04-24 MED ORDER — SENNA-DOCUSATE SODIUM 8.6-50 MG PO TABS: 1.0000 | ORAL_TABLET | Freq: Two times a day (BID) | ORAL | Status: DC

## 2012-04-24 NOTE — Progress Notes (Signed)
Patient discharged to home. Instructions given. Patient taken via wheelchair to car. Assisted into car driven by patient's sister. Erskin Burnet RN

## 2012-04-24 NOTE — Discharge Summary (Signed)
Physician Discharge Summary  Patient ID: Brandon Lawrence MRN: [id number] DOB/AGE: 50/15/1962 50 y.o.  Admit date: 04/22/2012 Discharge date: 04/24/2012  Admission Diagnoses: Left Renal Mass  Discharge Diagnoses: Left Renal Mass   Discharged Condition: good  Hospital Course:  Pt underwent robotic left partial nephrectomy with extensive adhesiolysis for worrisome upper pole renal mass with h/o prior exploratory laparotomy on the day of admission (12/18) without acute complications. He was admitted to the 4th floor Urology service post-op where he began his recovery. POD 1 the patient's JP drain was removed as its output was minimal. He had 2 episodes on nausea and emesis directly related to IV hydromorphone, but otherwise tolerated regular diet without problems with PO percocet. By POD 2, the day of discharge, he was ambulatory, pain controlled, tolerating diet and felt to be adequate for home care. Path pending at time of discharge.  Consults: None  Significant Diagnostic Studies: labs: Hgb 12.4, Cr 1.25 at discharge. Surgical path pending.  Treatments: surgery: left partial nephrectomy with extensive adhesiolysis for worrisome upper pole renal mass with h/o prior exploratory laparotomy on the day of admission (12/18)   Discharge Exam: Blood pressure 149/80, pulse 81, temperature 99 F (37.2 C), temperature source Oral, resp. rate 18, height 5\' 6"  (1.676 m), weight 69.31 kg (152 lb 12.8 oz), SpO2 100.00%. General appearance: alert, cooperative, appears stated age and Family at bedside Head: Normocephalic, without obvious abnormality, atraumatic Eyes: conjunctivae/corneas clear. PERRL, EOM's intact. Fundi benign. Ears: normal TM's and external ear canals both ears Nose: Nares normal. Septum midline. Mucosa normal. No drainage or sinus tenderness. Throat: lips, mucosa, and tongue normal; teeth and gums normal Neck: no adenopathy, no carotid bruit, no JVD, supple, symmetrical, trachea  midline and thyroid not enlarged, symmetric, no tenderness/mass/nodules Back: symmetric, no curvature. ROM normal. No CVA tenderness. Resp: clear to auscultation bilaterally Chest wall: no tenderness Cardio: normal apical impulse GI: soft, non-tender; bowel sounds normal; no masses,  no organomegaly Male genitalia: normal Extremities: extremities normal, atraumatic, no cyanosis or edema Pulses: 2+ and symmetric Skin: Skin color, texture, turgor normal. No rashes or lesions Lymph nodes: Cervical, supraclavicular, and axillary nodes normal. Neurologic: Grossly normal Incision/Wound: Recent port sites / extraction sites c/d/i without hernias.   Disposition: 01-Home or Self Care  Discharge Orders    Future Appointments: Provider: Department: Dept Phone: Center:   07/23/2012 8:00 AM Salley Scarlet, MD [facility] [phone] [facility]       Medication List     As of 04/24/2012  4:58 PM    STOP taking these medications         acetaminophen 500 MG tablet   Commonly known as: TYLENOL      TAKE these medications         chloridazePOXIDE-amitriptyline 5-12.5 MG per tablet   Commonly known as: LIMBITROL   Take 1 tablet by mouth every morning.      omeprazole 20 MG capsule   Commonly known as: PRILOSEC   Take 1 capsule (20 mg total) by mouth daily.      oxyCODONE 5 MG immediate release tablet   Commonly known as: Oxy IR/ROXICODONE   Take 1 tablet (5 mg total) by mouth every 4 (four) hours as needed.      sennosides-docusate sodium 8.6-50 MG tablet   Commonly known as: SENOKOT-S   Take 1 tablet by mouth 2 (two) times daily. While taking pain meds to prevent constipation  Follow-up Information    Follow up with [facility], Deshannon Seide, MD. (as previously scheduled for post-op visit. )    Contact information:   [address]nd Floor [place] [address] [phone]          Signed: Sebastian Ache 04/24/2012, 4:58 PM

## 2012-04-24 NOTE — Progress Notes (Signed)
Patient's sister returned to the floor to state that her brother was now in more pain. Paged Dr. Berneice Heinrich and told him of the patient's condition. Dr. Berneice Heinrich recommended that the patient go home, rest, and take his prescribed pain medication.  He recommended that if pain was still really bad after an hour or two that then the patient should consider going back to the emergency room. Patient had received pain medication last two hours prior. Sister was reassured and left the floor. Erskin Burnet RN

## 2012-04-24 NOTE — Progress Notes (Signed)
Pt has done well with IV Dilaudid and Phenergen. He has slept well, and now ambulated entire length of hall. Pt settled back to bed, used IS 1500, and pain and nausea med adm. Pt has greatly improved since shift change, as he was distressed and stated PO pain medication did not give him relief yesterday. Reassured.

## 2012-05-07 DIAGNOSIS — D4959 Neoplasm of unspecified behavior of other genitourinary organ: Secondary | ICD-10-CM | POA: Diagnosis not present

## 2012-07-20 ENCOUNTER — Encounter: Payer: Self-pay | Admitting: Family Medicine

## 2012-07-20 ENCOUNTER — Ambulatory Visit (HOSPITAL_COMMUNITY)
Admission: RE | Admit: 2012-07-20 | Discharge: 2012-07-20 | Disposition: A | Discharge status: Home / Self Care | Payer: Medicare Other | Source: Ambulatory Visit | Attending: Family Medicine | Admitting: Family Medicine

## 2012-07-20 ENCOUNTER — Ambulatory Visit (INDEPENDENT_AMBULATORY_CARE_PROVIDER_SITE_OTHER): Payer: Medicare Other | Admitting: Family Medicine

## 2012-07-20 VITALS — BP 130/78 | HR 64 | Resp 16 | Wt 160.0 lb

## 2012-07-20 DIAGNOSIS — M5137 Other intervertebral disc degeneration, lumbosacral region: Secondary | ICD-10-CM | POA: Insufficient documentation

## 2012-07-20 DIAGNOSIS — K219 Gastro-esophageal reflux disease without esophagitis: Secondary | ICD-10-CM

## 2012-07-20 DIAGNOSIS — M47817 Spondylosis without myelopathy or radiculopathy, lumbosacral region: Secondary | ICD-10-CM

## 2012-07-20 DIAGNOSIS — M549 Dorsalgia, unspecified: Secondary | ICD-10-CM

## 2012-07-20 DIAGNOSIS — C649 Malignant neoplasm of unspecified kidney, except renal pelvis: Secondary | ICD-10-CM

## 2012-07-20 DIAGNOSIS — C642 Malignant neoplasm of left kidney, except renal pelvis: Secondary | ICD-10-CM

## 2012-07-20 DIAGNOSIS — Z85528 Personal history of other malignant neoplasm of kidney: Secondary | ICD-10-CM | POA: Insufficient documentation

## 2012-07-20 DIAGNOSIS — F1290 Cannabis use, unspecified, uncomplicated: Secondary | ICD-10-CM | POA: Insufficient documentation

## 2012-07-20 DIAGNOSIS — M51379 Other intervertebral disc degeneration, lumbosacral region without mention of lumbar back pain or lower extremity pain: Secondary | ICD-10-CM | POA: Insufficient documentation

## 2012-07-20 DIAGNOSIS — R599 Enlarged lymph nodes, unspecified: Secondary | ICD-10-CM

## 2012-07-20 DIAGNOSIS — M4310 Spondylolisthesis, site unspecified: Secondary | ICD-10-CM | POA: Diagnosis not present

## 2012-07-20 DIAGNOSIS — F1210 Cannabis abuse, uncomplicated: Secondary | ICD-10-CM

## 2012-07-20 DIAGNOSIS — M545 Low back pain, unspecified: Secondary | ICD-10-CM | POA: Insufficient documentation

## 2012-07-20 DIAGNOSIS — R590 Localized enlarged lymph nodes: Secondary | ICD-10-CM

## 2012-07-20 LAB — CBC
MCH: 30.1 pg (ref 26.0–34.0)
MCV: 89.7 fL (ref 78.0–100.0)
Platelets: 201 10*3/uL (ref 150–400)
RBC: 4.35 MIL/uL (ref 4.22–5.81)

## 2012-07-20 LAB — BASIC METABOLIC PANEL
BUN: 16 mg/dL (ref 6–23)
Chloride: 105 mEq/L (ref 96–112)
Glucose, Bld: 95 mg/dL (ref 70–99)
Potassium: 4.4 mEq/L (ref 3.5–5.3)

## 2012-07-20 MED ORDER — DOCUSATE SODIUM 100 MG PO CAPS: 100.0000 mg | ORAL_CAPSULE | Freq: Two times a day (BID) | ORAL | Status: DC

## 2012-07-20 MED ORDER — OMEPRAZOLE 40 MG PO CPDR: 40.0000 mg | DELAYED_RELEASE_CAPSULE | Freq: Every day | ORAL | Status: DC

## 2012-07-20 MED ORDER — HYDROCODONE-ACETAMINOPHEN 5-325 MG PO TABS: 1.0000 | ORAL_TABLET | Freq: Four times a day (QID) | ORAL | Status: DC | PRN

## 2012-07-20 NOTE — Assessment & Plan Note (Signed)
X-ray showed advanced degenerative disc disease as well as spondylosis however no spinal nerve impingement. Or cord impingement. He's not having radicular symptoms. He's been given a refill on hydrocodone. There no lytic lesion seen on x-ray

## 2012-07-20 NOTE — Assessment & Plan Note (Signed)
Status post partial nephrectomy on the left side. I would check his renal function today. Hydrocodone refills he is to take this with stool softeners

## 2012-07-20 NOTE — Assessment & Plan Note (Signed)
NSAIDs increase his Prilosec to 40 mg

## 2012-07-20 NOTE — Progress Notes (Signed)
  Subjective:    Patient ID: Brandon Lawrence, male    DOB: [birth year], 52 y.o.   MRN: [id number]  HPI  Patient here to follow chronic medical problems. Her last exam he was referred to gastroenterology secondary to abdominal pain and GERD as well as screening colonoscopy. He has CT scan done which showed the left renal mass he saw urology and was found to have left renal cell carcinoma which is been removed. He denies any difficulty urinating. He does have pain in his lower back as well as his shoulder his knees and legs which is chronic. He was also seen by ear nose and throat secondary to lymphadenopathy found on exam he did not follow up secondary to his surgery and needs a followup rescheduled. The CT scan did show to nodes but did not appear to be pathological. He denies any tenderness the nodes at this time. For his back pain he did start taking the increased dose of ibuprofen which made his her reflux worse  Review of Systems  GEN- denies fatigue, fever, weight loss,weakness, recent illness HEENT- denies eye drainage, change in vision, nasal discharge, CVS- denies chest pain, palpitations RESP- denies SOB, cough, wheeze ABD- denies N/V, change in stools, abd pain GU- denies dysuria, hematuria, dribbling, incontinence MSK- + joint pain, muscle aches, injury Neuro- denies headache, dizziness, syncope, seizure activity      Objective:   Physical Exam GEN- NAD, alert and oriented x3 HEENT- PERRL, EOMI, non injected sclera, pink conjunctiva, MMM, oropharynx clear Neck- Supple,right side lymph node mandibular region, NT, non fluctuant CVS- RRR, no murmur RESP-CTAB ABD-NABS,soft,NT,ND Back- Spine NT, TTP paraspinals, neg SLR, walks with cane EXT- No edema Pulses- Radial, DP- 2+        Assessment & Plan:

## 2012-07-20 NOTE — Patient Instructions (Addendum)
Do not smoke marijuana  Pain medications must last for the month Get the xray of back from [facility] Get your labs done Get the labs done New dose of acid medicine- Increase prilosec to 40mg ,- take 2 of your 20mg  tablets F/U 3 months

## 2012-07-20 NOTE — Assessment & Plan Note (Signed)
It appears to be nonpathological lymphadenopathy. He did not have a followup secondary to need for his kidney surgery. I'll obtain a followup visit with ear nose and throat to make sure that nothing else needs to be done

## 2012-07-20 NOTE — Assessment & Plan Note (Signed)
He was advised to stop use of marijuana

## 2012-07-23 ENCOUNTER — Ambulatory Visit: Payer: Medicare Other | Admitting: Family Medicine

## 2012-07-29 ENCOUNTER — Other Ambulatory Visit: Payer: Self-pay | Admitting: Family Medicine

## 2012-08-06 ENCOUNTER — Ambulatory Visit (INDEPENDENT_AMBULATORY_CARE_PROVIDER_SITE_OTHER): Payer: Medicare Other | Admitting: Otolaryngology

## 2012-08-06 DIAGNOSIS — R599 Enlarged lymph nodes, unspecified: Secondary | ICD-10-CM | POA: Diagnosis not present

## 2012-10-20 ENCOUNTER — Ambulatory Visit: Payer: Medicare Other | Admitting: Family Medicine

## 2012-10-20 ENCOUNTER — Ambulatory Visit: Payer: Self-pay | Admitting: Family Medicine

## 2012-10-29 DIAGNOSIS — N2889 Other specified disorders of kidney and ureter: Secondary | ICD-10-CM | POA: Diagnosis not present

## 2012-10-29 DIAGNOSIS — D4959 Neoplasm of unspecified behavior of other genitourinary organ: Secondary | ICD-10-CM | POA: Diagnosis not present

## 2012-10-29 DIAGNOSIS — C649 Malignant neoplasm of unspecified kidney, except renal pelvis: Secondary | ICD-10-CM | POA: Diagnosis not present

## 2012-11-05 DIAGNOSIS — D4959 Neoplasm of unspecified behavior of other genitourinary organ: Secondary | ICD-10-CM | POA: Diagnosis not present

## 2012-11-05 DIAGNOSIS — Z125 Encounter for screening for malignant neoplasm of prostate: Secondary | ICD-10-CM | POA: Diagnosis not present

## 2012-12-04 DIAGNOSIS — G4733 Obstructive sleep apnea (adult) (pediatric): Secondary | ICD-10-CM

## 2012-12-04 HISTORY — DX: Obstructive sleep apnea (adult) (pediatric): G47.33

## 2012-12-16 ENCOUNTER — Encounter (HOSPITAL_COMMUNITY): Payer: Self-pay | Admitting: *Deleted

## 2012-12-16 ENCOUNTER — Emergency Department (HOSPITAL_COMMUNITY): Payer: Medicare Other

## 2012-12-16 ENCOUNTER — Inpatient Hospital Stay (HOSPITAL_COMMUNITY)
Admission: EM | Admit: 2012-12-16 | Discharge: 2012-12-18 | DRG: 390 | Disposition: A | Discharge status: Home / Self Care | Payer: Medicare Other | Attending: Internal Medicine | Admitting: Internal Medicine

## 2012-12-16 DIAGNOSIS — G8929 Other chronic pain: Secondary | ICD-10-CM | POA: Diagnosis present

## 2012-12-16 DIAGNOSIS — R109 Unspecified abdominal pain: Secondary | ICD-10-CM | POA: Diagnosis present

## 2012-12-16 DIAGNOSIS — Z9089 Acquired absence of other organs: Secondary | ICD-10-CM

## 2012-12-16 DIAGNOSIS — K56609 Unspecified intestinal obstruction, unspecified as to partial versus complete obstruction: Secondary | ICD-10-CM | POA: Diagnosis not present

## 2012-12-16 DIAGNOSIS — F1290 Cannabis use, unspecified, uncomplicated: Secondary | ICD-10-CM | POA: Diagnosis present

## 2012-12-16 DIAGNOSIS — K5289 Other specified noninfective gastroenteritis and colitis: Secondary | ICD-10-CM | POA: Diagnosis present

## 2012-12-16 DIAGNOSIS — K565 Intestinal adhesions [bands], unspecified as to partial versus complete obstruction: Principal | ICD-10-CM | POA: Diagnosis present

## 2012-12-16 DIAGNOSIS — R1013 Epigastric pain: Secondary | ICD-10-CM | POA: Diagnosis not present

## 2012-12-16 DIAGNOSIS — Z905 Acquired absence of kidney: Secondary | ICD-10-CM

## 2012-12-16 DIAGNOSIS — Z87891 Personal history of nicotine dependence: Secondary | ICD-10-CM

## 2012-12-16 DIAGNOSIS — K219 Gastro-esophageal reflux disease without esophagitis: Secondary | ICD-10-CM | POA: Diagnosis not present

## 2012-12-16 DIAGNOSIS — M129 Arthropathy, unspecified: Secondary | ICD-10-CM | POA: Diagnosis present

## 2012-12-16 DIAGNOSIS — R112 Nausea with vomiting, unspecified: Secondary | ICD-10-CM | POA: Diagnosis not present

## 2012-12-16 DIAGNOSIS — F1210 Cannabis abuse, uncomplicated: Secondary | ICD-10-CM | POA: Diagnosis present

## 2012-12-16 LAB — COMPREHENSIVE METABOLIC PANEL
AST: 22 U/L (ref 0–37)
Albumin: 3.6 g/dL (ref 3.5–5.2)
BUN: 16 mg/dL (ref 6–23)
Calcium: 9.1 mg/dL (ref 8.4–10.5)
Chloride: 100 mEq/L (ref 96–112)
Creatinine, Ser: 1.08 mg/dL (ref 0.50–1.35)
Total Bilirubin: 0.5 mg/dL (ref 0.3–1.2)
Total Protein: 7.2 g/dL (ref 6.0–8.3)

## 2012-12-16 LAB — LIPASE, BLOOD: Lipase: 126 U/L — ABNORMAL HIGH (ref 11–59)

## 2012-12-16 LAB — CBC
MCHC: 34.5 g/dL (ref 30.0–36.0)
MCV: 90.9 fL (ref 78.0–100.0)
Platelets: 188 10*3/uL (ref 150–400)
RDW: 14 % (ref 11.5–15.5)
WBC: 12.2 10*3/uL — ABNORMAL HIGH (ref 4.0–10.5)

## 2012-12-16 MED ORDER — HYDROMORPHONE HCL PF 1 MG/ML IJ SOLN
1.0000 mg | INTRAMUSCULAR | Status: DC | PRN
  Administered 2012-12-16 (×2): 1 mg via INTRAVENOUS
  Filled 2012-12-16 (×2): qty 1

## 2012-12-16 MED ORDER — FENTANYL CITRATE 0.05 MG/ML IJ SOLN
12.5000 ug | Freq: Once | INTRAMUSCULAR | Status: AC
  Administered 2012-12-16: 12.5 ug via INTRAVENOUS
  Filled 2012-12-16: qty 2

## 2012-12-16 MED ORDER — SODIUM CHLORIDE 0.9 % IV BOLUS (SEPSIS)
1000.0000 mL | Freq: Once | INTRAVENOUS | Status: AC
  Administered 2012-12-16: 1000 mL via INTRAVENOUS

## 2012-12-16 MED ORDER — IOHEXOL 300 MG/ML  SOLN
50.0000 mL | Freq: Once | INTRAMUSCULAR | Status: AC | PRN
  Administered 2012-12-16: 50 mL via ORAL

## 2012-12-16 MED ORDER — ONDANSETRON HCL 4 MG PO TABS: 4.0000 mg | ORAL_TABLET | Freq: Four times a day (QID) | ORAL | Status: DC | PRN

## 2012-12-16 MED ORDER — ALBUTEROL SULFATE (5 MG/ML) 0.5% IN NEBU: 2.5000 mg | INHALATION_SOLUTION | RESPIRATORY_TRACT | Status: DC | PRN

## 2012-12-16 MED ORDER — PANTOPRAZOLE SODIUM 40 MG IV SOLR
40.0000 mg | Freq: Every day | INTRAVENOUS | Status: DC
  Administered 2012-12-16 – 2012-12-18 (×3): 40 mg via INTRAVENOUS
  Filled 2012-12-16 (×3): qty 40

## 2012-12-16 MED ORDER — HYDROMORPHONE HCL PF 1 MG/ML IJ SOLN
1.0000 mg | INTRAMUSCULAR | Status: DC | PRN
  Administered 2012-12-16 – 2012-12-18 (×6): 1 mg via INTRAVENOUS
  Filled 2012-12-16 (×6): qty 1

## 2012-12-16 MED ORDER — ONDANSETRON HCL 4 MG/2ML IJ SOLN
4.0000 mg | Freq: Once | INTRAMUSCULAR | Status: AC
  Administered 2012-12-16: 4 mg via INTRAVENOUS
  Filled 2012-12-16: qty 2

## 2012-12-16 MED ORDER — POTASSIUM CHLORIDE IN NACL 20-0.9 MEQ/L-% IV SOLN
INTRAVENOUS | Status: DC
  Administered 2012-12-16 – 2012-12-18 (×4): via INTRAVENOUS

## 2012-12-16 MED ORDER — IOHEXOL 300 MG/ML  SOLN
100.0000 mL | Freq: Once | INTRAMUSCULAR | Status: AC | PRN
  Administered 2012-12-16: 100 mL via INTRAVENOUS

## 2012-12-16 MED ORDER — ONDANSETRON HCL 4 MG/2ML IJ SOLN
INTRAMUSCULAR | Status: AC
  Filled 2012-12-16: qty 2

## 2012-12-16 MED ORDER — SODIUM CHLORIDE 0.9 % IV SOLN: INTRAVENOUS | Status: DC

## 2012-12-16 MED ORDER — ONDANSETRON HCL 4 MG/2ML IJ SOLN
4.0000 mg | Freq: Four times a day (QID) | INTRAMUSCULAR | Status: DC | PRN
  Administered 2012-12-16 – 2012-12-17 (×3): 4 mg via INTRAVENOUS
  Filled 2012-12-16 (×2): qty 2

## 2012-12-16 MED ORDER — FENTANYL CITRATE 0.05 MG/ML IJ SOLN
25.0000 ug | INTRAMUSCULAR | Status: DC | PRN
  Administered 2012-12-16: 25 ug via INTRAVENOUS
  Filled 2012-12-16: qty 2
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
## 2012-12-16 NOTE — Progress Notes (Signed)
UR Chart Review Completed  

## 2012-12-16 NOTE — ED Notes (Signed)
Pt reporting nausea and vomiting beginning about 4 hours ago.  Reports one episode of diarrhea, also reporting generalized pain in upper abdomen.
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
## 2012-12-16 NOTE — ED Notes (Signed)
Patient states that he drank entire first bottle of contrast. Has drank about 1/3 of second bottle and vomited the 1/3 of second bottle. Pollyann Samples, CT Tech notified. She said he can stop drinking it now.

## 2012-12-17 ENCOUNTER — Inpatient Hospital Stay (HOSPITAL_COMMUNITY): Payer: Medicare Other

## 2012-12-17 DIAGNOSIS — K56609 Unspecified intestinal obstruction, unspecified as to partial versus complete obstruction: Secondary | ICD-10-CM | POA: Diagnosis not present

## 2012-12-17 LAB — BASIC METABOLIC PANEL
CO2: 24 mEq/L (ref 19–32)
Calcium: 8.3 mg/dL — ABNORMAL LOW (ref 8.4–10.5)
Chloride: 104 mEq/L (ref 96–112)
Creatinine, Ser: 1.13 mg/dL (ref 0.50–1.35)
Glucose, Bld: 90 mg/dL (ref 70–99)

## 2012-12-17 NOTE — Progress Notes (Signed)
Mr Maybury tolerated clear liquids without nausea or vomiting  Rates pain at 3 on 0/10 scale to abdomine

## 2012-12-17 NOTE — Progress Notes (Signed)
Pt had increase in abdominal pain with full liquid diet described as a burning sensation rt mid abdominal area  Some slight nausea without emesis. Pt was medicated for pain  Pt later ambulated in the halls

## 2012-12-17 NOTE — Progress Notes (Signed)
[facility] PROGRESS NOTE  Brandon Lawrence ZOX:[id number] DOB: [birth year] DOA: 12/16/2012 PCP: Milinda Antis, MD  Brief narrative 52 y.o. male with history of chronic abdominal pain, intermittent vomiting, GERD , substance abuse/marijuana, cholecystectomy, partial left nephrectomy & LOA presented to the ED on 12/16/12 with complaints of worsening abdominal pain and intractable vomiting. In the ED, CT abdomen suggested partial small bowel obstruction versus enteritis.  Assessment/Plan: 1. Partial small bowel obstruction: Possibly due to adhesions. DD-enteritis. Patient was treated conservatively-N.p.o. , IV fluids, antiemetics, pain medications, mobilize and monitor clinically. IV PPI. Clinically improving. KUB cannot comment on small bowel loops but contrast throughout nondilated colon. Start clear liquids and advance diet as tolerated. 2. GERD: IV PPI. 3. Substance abuse/marijuana: Cessation counseled. 4. Chronic abdominal pain: Patient states that he has been having intermittent episodes of vomiting in the morning ongoing for one-2 months. He can followup with his gastroenterologist as outpatient once acute hospitalization is done.   Code Status: Full Family Communication: Discussed with family at bedside. Disposition Plan: Home possibly 8/15   Consultants:  None  Procedures:  None  Antibiotics:  None   HPI/Subjective: No vomiting since yesterday afternoon. Occasional mild nausea. Abdominal pain back to baseline. Flatus +. No BMs.  Objective: Filed Vitals:   12/16/12 0901 12/16/12 1108 12/16/12 2121 12/17/12 0414  BP: 128/65 142/73 128/69 134/73  Pulse: 64 57 53 55  Temp:  98.9 F (37.2 C) 98.7 F (37.1 C) 98.4 F (36.9 C)  TempSrc:  Oral Oral Oral  Resp: 20 20 20 20   Height:      Weight:      SpO2: 98% 100% 100% 99%    Intake/Output Summary (Last 24 hours) at 12/17/12 1050 Last data filed at 12/17/12 0600  Gross per 24 hour  Intake   2540 ml  Output       0 ml  Net   2540 ml   Filed Weights   12/16/12 0317  Weight: 71.215 kg (157 lb)    Exam:   General exam: Comfortable.  Respiratory system: Clear. No increased work of breathing.  Cardiovascular system: S1 & S2 heard, RRR. No JVD, murmurs, gallops, clicks or pedal edema.  Gastrointestinal system: Abdomen is nondistended, soft and nontender. Normal bowel sounds heard. Laparotomy scars +  Central nervous system: Alert and oriented. No focal neurological deficits.  Extremities: Symmetric 5 x 5 power.   Data Reviewed: Basic Metabolic Panel:  Recent Labs Lab 12/16/12 0325 12/17/12 0548  NA 136 138  K 3.9 4.2  CL 100 104  CO2 26 24  GLUCOSE 126* 90  BUN 16 14  CREATININE 1.08 1.13  CALCIUM 9.1 8.3*   Liver Function Tests:  Recent Labs Lab 12/16/12 0325  AST 22  ALT 17  ALKPHOS 47  BILITOT 0.5  PROT 7.2  ALBUMIN 3.6    Recent Labs Lab 12/16/12 0325  LIPASE 126*   No results found for this basename: AMMONIA,  in the last 168 hours CBC:  Recent Labs Lab 12/16/12 0325  WBC 12.2*  HGB 13.4  HCT 38.8*  MCV 90.9  PLT 188   Cardiac Enzymes: No results found for this basename: CKTOTAL, CKMB, CKMBINDEX, TROPONINI,  in the last 168 hours BNP (last 3 results) No results found for this basename: PROBNP,  in the last 8760 hours CBG: No results found for this basename: GLUCAP,  in the last 168 hours  No results found for this or any previous visit (from the past 240 hour(s)).  Studies: Abd 1 View (kub)  12/17/2012   *RADIOLOGY REPORT*  Clinical Data: Small bowel obstruction follow-up.  ABDOMEN - 1 VIEW  Comparison: 12/16/2012 CT.  Findings: Contrast throughout nondilated colon.  Gas and fluid filled small bowel loops with slightly prominent folds.  The caliber of the small bowel loops is incompletely assessed by plain film examination and therefore difficult to make direct comparison to the prior CT.  Post cholecystectomy.  Metallic structure overlies the  right femoral region.  The possibility of free intraperitoneal air cannot be addressed on a supine view.  IMPRESSION: Contrast throughout nondilated colon.  Gas and fluid filled small bowel loops with slightly prominent folds.  The caliber of the small bowel loops is incompletely assessed by plain film examination and therefore difficult to make direct comparison to the prior CT.   Original Report Authenticated By: Lacy Duverney, M.D.   Ct Abdomen Pelvis W Contrast  12/16/2012   *RADIOLOGY REPORT*  Clinical Data: Upper abdominal pain with nausea vomiting.  CT ABDOMEN AND PELVIS WITH CONTRAST  Technique:  Multidetector CT imaging of the abdomen and pelvis was performed following the standard protocol during bolus administration of intravenous contrast.  Contrast: 50mL OMNIPAQUE IOHEXOL 300 MG/ML  SOLN, OMNIPAQUE IOHEXOL 300 MG/ML  SOLN  Comparison: 10/29/2012.  Findings:  BODY WALL: Unremarkable.  LOWER CHEST:  Mediastinum: Unremarkable.  Lungs/pleura: No consolidation.  ABDOMEN/PELVIS:  Liver: No suspicious focal abnormality.  Biliary: Cholecystectomy.  Pancreas: Unremarkable.  Spleen: Unremarkable.  Adrenals: Unremarkable.  Kidneys and ureters: No abnormal enhancement along the left upper pole partial nephrectomy margin.  6 mm low attenuation lesion in the interpolar left kidney is unchanged from priors.  Bladder: Unremarkable.  Bowel: Dilated proximal small bowel, to 4.2 cm, including the duodenum and proximal jejunum. There is angulation of small bowel loops in the upper abdomen, which may represent adhesive changes. Contrast extends well beyond the dilated portions, into the normal caliber ascending colon. Normal appendix.  Retroperitoneum: No mass or adenopathy.  Peritoneum: No free fluid or gas.  Reproductive: Unremarkable.  Vascular: No acute abnormality.  OSSEOUS: L5 pars defects with grade 1 anterolisthesis and advanced L4-L5 and L5-S1  degenerative disc disease.  IMPRESSION:  1.  Dilated proximal  small bowel, but no high-grade obstruction. The dilation suggests chronic/intermittent partial small bowel obstruction or enteritis.  2. Unremarkable postoperative appearance of the left kidney upper pole.   Original Report Authenticated By: Tiburcio Pea     Additional labs:   Scheduled Meds: . pantoprazole (PROTONIX) IV  40 mg Intravenous Daily   Continuous Infusions: . 0.9 % NaCl with KCl 20 mEq / L 125 mL/hr at 12/17/12 0310    Principal Problem:   SBO (small bowel obstruction) Active Problems:   Chronic abdominal pain   GERD (gastroesophageal reflux disease)   Marijuana use    Time spent: 20 minutes    [facility]  [facility] Pager [phone].   If 8PM-8AM, please contact night-coverage at [email or web address], password [facility] 12/17/2012, 10:50 AM  LOS: 1 day

## 2012-12-17 NOTE — Care Management Note (Signed)
    Page 1 of 1   12/17/2012     2:10:32 PM   CARE MANAGEMENT NOTE 12/17/2012  Patient:  Brandon Lawrence, Brandon Lawrence   Account Number:  [id number]  Date Initiated:  12/17/2012  Documentation initiated by:  Rosemary Holms  Subjective/Objective Assessment:   Pt admitted from home where he lives with his mother. When asked about HH, pt laughed and said his Mom will make sure he is ok. Declined HH.     Action/Plan:   Anticipated DC Date:  12/18/2012   Anticipated DC Plan:  HOME/SELF CARE      DC Planning Services  CM consult      Choice offered to / List presented to:             Status of service:  Completed, signed off Medicare Important Message given?   (If response is "NO", the following Medicare IM given date fields will be blank) Date Medicare IM given:   Date Additional Medicare IM given:    Discharge Disposition:    Per UR Regulation:    If discussed at Long Length of Stay Meetings, dates discussed:    Comments:  12/17/12 Rosemary Holms RN bSN CM

## 2012-12-17 NOTE — Progress Notes (Signed)
Pt c/o nausea after evening meal No emesis Nausea medication given  Pt continues on full liquid diet Pt states he is passing gas but no BM

## 2012-12-18 MED ORDER — ACETAMINOPHEN 325 MG PO TABS: 650.0000 mg | ORAL_TABLET | Freq: Four times a day (QID) | ORAL | Status: DC | PRN

## 2012-12-18 NOTE — Discharge Summary (Signed)
Physician Discharge Summary  Brandon Lawrence:[id number] DOB: 05-Sep-1960 DOA: 12/16/2012  PCP: Milinda Antis, MD  Admit date: 12/16/2012 Discharge date: 12/18/2012  Time spent: Greater than 30 minutes  Recommendations for Outpatient Follow-up:  1. Dr. Milinda Antis, PCP in 5 days. 2. Dr. Eula Listen, GI in 2 weeks.  Discharge Diagnoses:  Principal Problem:   SBO (small bowel obstruction) Active Problems:   Chronic abdominal pain   GERD (gastroesophageal reflux disease)   Marijuana use   Discharge Condition: Improved & Stable  Diet recommendation: Soft diet.  Filed Weights   12/16/12 0317  Weight: 71.215 kg (157 lb)    History of present illness:  52 y.o. male with history of chronic abdominal pain, intermittent vomiting, GERD , substance abuse/marijuana, cholecystectomy, partial left nephrectomy & LOA presented to the ED on 12/16/12 with complaints of worsening abdominal pain and intractable vomiting. In the ED, CT abdomen suggested partial small bowel obstruction versus enteritis  Hospital Course:  1. Partial small bowel obstruction: Possibly due to adhesions. DD-enteritis. Patient was treated conservatively-N.p.o. , IV fluids, antiemetics, pain medications, mobilize and monitor clinically. IV PPI. Clinically improved. Followup KUB cannot comment on small bowel loops but contrast throughout nondilated colon. Patient was started on clear liquid diet yesterday morning which was gradually advanced. He had small BM last night. No vomiting. Intermittent mild nausea and abdominal pain. No abdominal distention. Has tolerated soft diet this afternoon. It is possible that patient may be having intermittent/chronic partial small bowel obstruction from adhesions leading to chronic abdominal pain.  2. GERD: Continue home PPIs. 3. Substance abuse/marijuana: Cessation counseled.  4. Chronic abdominal pain: Patient states that he has been having intermittent episodes of vomiting in the  morning ongoing for one-2 months. Outpatient followup with gastroenterology to see if any further workup is necessary.  Procedures:  None   Consultations:  None  Discharge Exam:  Complaints: Patient had small BM last night. No vomiting. Occasional nausea. Has tolerated soft diet. Mild intermittent abdominal pain.  Filed Vitals:   12/18/12 0205 12/18/12 0414 12/18/12 0700 12/18/12 1408  BP: 154/75 140/76  154/71  Pulse: 51 50 62 55  Temp: 98.4 F (36.9 C) 98.3 F (36.8 C)  98.4 F (36.9 C)  TempSrc: Oral Oral  Oral  Resp: 20 20  20   Height:      Weight:      SpO2: 100% 98% 96% 98%     General exam: Comfortable.   Respiratory system: Clear. No increased work of breathing.   Cardiovascular system: S1 & S2 heard, RRR. No JVD, murmurs, gallops, clicks or pedal edema.   Gastrointestinal system: Abdomen is nondistended, soft and nontender. Normal bowel sounds heard. Laparotomy scars +   Central nervous system: Alert and oriented. No focal neurological deficits.   Extremities: Symmetric 5 x 5 power.   Discharge Instructions      Discharge Orders   Future Orders Complete By Expires   Activity as tolerated - No restrictions  As directed    Call MD for:  persistant nausea and vomiting  As directed    Call MD for:  severe uncontrolled pain  As directed    Discharge instructions  As directed    Comments:     Diet: Soft diet.       Medication List         acetaminophen 325 MG tablet  Commonly known as:  TYLENOL  Take 2 tablets (650 mg total) by mouth every 6 (six) hours as needed  for pain.     omeprazole 40 MG capsule  Commonly known as:  PRILOSEC  Take 1 capsule (40 mg total) by mouth daily.       Follow-up Information   Follow up with Milinda Antis, MD. Schedule an appointment as soon as possible for a visit in 5 days.   Specialty:  Family Medicine   Contact information:   [address] [place] [address] [phone]       Follow up  with Eula Listen, MD. Schedule an appointment as soon as possible for a visit in 2 weeks.   Specialty:  Gastroenterology   Contact information:   [address] [place] [address] [phone]        The results of significant diagnostics from this hospitalization (including imaging, microbiology, ancillary and laboratory) are listed below for reference.    Significant Diagnostic Studies: Abd 1 View (kub)  12/17/2012   *RADIOLOGY REPORT*  Clinical Data: Small bowel obstruction follow-up.  ABDOMEN - 1 VIEW  Comparison: 12/16/2012 CT.  Findings: Contrast throughout nondilated colon.  Gas and fluid filled small bowel loops with slightly prominent folds.  The caliber of the small bowel loops is incompletely assessed by plain film examination and therefore difficult to make direct comparison to the prior CT.  Post cholecystectomy.  Metallic structure overlies the right femoral region.  The possibility of free intraperitoneal air cannot be addressed on a supine view.  IMPRESSION: Contrast throughout nondilated colon.  Gas and fluid filled small bowel loops with slightly prominent folds.  The caliber of the small bowel loops is incompletely assessed by plain film examination and therefore difficult to make direct comparison to the prior CT.   Original Report Authenticated By: Lacy Duverney, M.D.   Ct Abdomen Pelvis W Contrast  12/16/2012   *RADIOLOGY REPORT*  Clinical Data: Upper abdominal pain with nausea vomiting.  CT ABDOMEN AND PELVIS WITH CONTRAST  Technique:  Multidetector CT imaging of the abdomen and pelvis was performed following the standard protocol during bolus administration of intravenous contrast.  Contrast: 50mL OMNIPAQUE IOHEXOL 300 MG/ML  SOLN, OMNIPAQUE IOHEXOL 300 MG/ML  SOLN  Comparison: 10/29/2012.  Findings:  BODY WALL: Unremarkable.  LOWER CHEST:  Mediastinum: Unremarkable.  Lungs/pleura: No consolidation.  ABDOMEN/PELVIS:  Liver: No suspicious  focal abnormality.  Biliary: Cholecystectomy.  Pancreas: Unremarkable.  Spleen: Unremarkable.  Adrenals: Unremarkable.  Kidneys and ureters: No abnormal enhancement along the left upper pole partial nephrectomy margin.  6 mm low attenuation lesion in the interpolar left kidney is unchanged from priors.  Bladder: Unremarkable.  Bowel: Dilated proximal small bowel, to 4.2 cm, including the duodenum and proximal jejunum. There is angulation of small bowel loops in the upper abdomen, which may represent adhesive changes. Contrast extends well beyond the dilated portions, into the normal caliber ascending colon. Normal appendix.  Retroperitoneum: No mass or adenopathy.  Peritoneum: No free fluid or gas.  Reproductive: Unremarkable.  Vascular: No acute abnormality.  OSSEOUS: L5 pars defects with grade 1 anterolisthesis and advanced L4-L5 and L5-S1  degenerative disc disease.  IMPRESSION:  1.  Dilated proximal small bowel, but no high-grade obstruction. The dilation suggests chronic/intermittent partial small bowel obstruction or enteritis.  2. Unremarkable postoperative appearance of the left kidney upper pole.   Original Report Authenticated By: Tiburcio Pea    Microbiology: No results found for this or any previous visit (from the past 240 hour(s)).   Labs: Basic Metabolic Panel:  Recent Labs Lab 12/16/12 0325 12/17/12 0548  NA 136 138  K 3.9 4.2  CL 100 104  CO2 26 24  GLUCOSE 126* 90  BUN 16 14  CREATININE 1.08 1.13  CALCIUM 9.1 8.3*   Liver Function Tests:  Recent Labs Lab 12/16/12 0325  AST 22  ALT 17  ALKPHOS 47  BILITOT 0.5  PROT 7.2  ALBUMIN 3.6    Recent Labs Lab 12/16/12 0325  LIPASE 126*   No results found for this basename: AMMONIA,  in the last 168 hours CBC:  Recent Labs Lab 12/16/12 0325  WBC 12.2*  HGB 13.4  HCT 38.8*  MCV 90.9  PLT 188   Cardiac Enzymes: No results found for this basename: CKTOTAL, CKMB, CKMBINDEX, TROPONINI,  in the last 168  hours BNP: BNP (last 3 results) No results found for this basename: PROBNP,  in the last 8760 hours CBG: No results found for this basename: GLUCAP,  in the last 168 hours  Additional labs:    Signed:  HONGALGI,ANAND  [facility] 12/18/2012, 2:47 PM

## 2012-12-18 NOTE — Progress Notes (Signed)
Patient with orders to be discharge home. Discharge instructions given, patient verbalized understanding. Patient in stable condition. Patient left with family in private vehicle. 

## 2012-12-23 ENCOUNTER — Inpatient Hospital Stay: Payer: Medicare Other | Admitting: Family Medicine

## 2012-12-25 ENCOUNTER — Ambulatory Visit (INDEPENDENT_AMBULATORY_CARE_PROVIDER_SITE_OTHER): Payer: Medicare Other | Admitting: Family Medicine

## 2012-12-25 ENCOUNTER — Encounter: Payer: Self-pay | Admitting: Family Medicine

## 2012-12-25 VITALS — BP 148/64 | HR 60 | Temp 98.1°F | Resp 20 | Ht 65.25 in | Wt 140.0 lb

## 2012-12-25 DIAGNOSIS — R112 Nausea with vomiting, unspecified: Secondary | ICD-10-CM | POA: Insufficient documentation

## 2012-12-25 DIAGNOSIS — G4730 Sleep apnea, unspecified: Secondary | ICD-10-CM

## 2012-12-25 DIAGNOSIS — K56609 Unspecified intestinal obstruction, unspecified as to partial versus complete obstruction: Secondary | ICD-10-CM | POA: Diagnosis not present

## 2012-12-25 DIAGNOSIS — G8929 Other chronic pain: Secondary | ICD-10-CM

## 2012-12-25 DIAGNOSIS — R030 Elevated blood-pressure reading, without diagnosis of hypertension: Secondary | ICD-10-CM | POA: Insufficient documentation

## 2012-12-25 DIAGNOSIS — R109 Unspecified abdominal pain: Secondary | ICD-10-CM

## 2012-12-25 DIAGNOSIS — R7309 Other abnormal glucose: Secondary | ICD-10-CM

## 2012-12-25 LAB — CBC WITH DIFFERENTIAL/PLATELET
Basophils Absolute: 0 10*3/uL (ref 0.0–0.1)
Eosinophils Relative: 4 % (ref 0–5)
HCT: 39.4 % (ref 39.0–52.0)
Hemoglobin: 14 g/dL (ref 13.0–17.0)
Lymphocytes Relative: 35 % (ref 12–46)
MCHC: 35.5 g/dL (ref 30.0–36.0)
MCV: 89.5 fL (ref 78.0–100.0)
Monocytes Absolute: 0.7 10*3/uL (ref 0.1–1.0)
Monocytes Relative: 9 % (ref 3–12)
Neutro Abs: 4.1 10*3/uL (ref 1.7–7.7)
RDW: 14.3 % (ref 11.5–15.5)
WBC: 7.9 10*3/uL (ref 4.0–10.5)

## 2012-12-25 LAB — BASIC METABOLIC PANEL
BUN: 18 mg/dL (ref 6–23)
CO2: 28 mEq/L (ref 19–32)
Chloride: 101 mEq/L (ref 96–112)
Creat: 1.25 mg/dL (ref 0.50–1.35)
Glucose, Bld: 94 mg/dL (ref 70–99)
Potassium: 4.3 mEq/L (ref 3.5–5.3)

## 2012-12-25 LAB — LIPASE: Lipase: 84 U/L — ABNORMAL HIGH (ref 0–75)

## 2012-12-25 MED ORDER — PROMETHAZINE HCL 12.5 MG PO TABS: 12.5000 mg | ORAL_TABLET | Freq: Three times a day (TID) | ORAL | Status: DC | PRN

## 2012-12-25 NOTE — Assessment & Plan Note (Signed)
Prescribe Phenergan for the nausea vomiting or will check his electrolytes. He will continue the proton pump inhibitor

## 2012-12-25 NOTE — Patient Instructions (Signed)
Start phenergan as needed for nausea Continue stool softeners Referral to have sleep and breathing checked We will call with lab results F/U 6 weeks for blood pressure

## 2012-12-25 NOTE — Assessment & Plan Note (Signed)
Concern for possible sleep apnea. I attempted to give him the Epworth Sleepiness Scale but I don't think he understood instructions very well he states he will follow sleep on call with the events with the exception of sitting idle in the car. He does have heavy snoring involved as well. I will send him to pulmonary to have sleep study done

## 2012-12-25 NOTE — Assessment & Plan Note (Signed)
Blood pressure elevated today we will recheck in 6 weeks he has no history of hypertension

## 2012-12-25 NOTE — Progress Notes (Signed)
  Subjective:    Patient ID: Brandon Lawrence, male    DOB: 1960-12-02, 52 y.o.   MRN: [id number]  HPI  Patient here for hospital followup. She was recently admitted secondary to possible small bowel obstruction versus enteritis. He is history of chronic abdominal pain associated with nausea and vomiting. He was evaluated by GI at the end of last year was noted to have renal cell carcinoma the left side he is status post left partial nephrectomy and is doing well from this. He did have a repeat scan recently which showed no recurrence of the cancer. Every morning he tends to have some nausea and vomiting but states they can happen throughout the day. He's taken his PPI as prescribed. His bowels have improved now that he is taking a stool softener twice a day.   hospital discharge reviewed. Was noted that his lipase was elevated  Patient also complains of difficulty sleeping and shortness of breath at night. States that he wakes up every time he lays flat feeling like he cannot breathe his girlfriend states that he snores a lot. He typically sleeps in a recliner due to fear that he will not wake up from his breathing problems. He states that he is always tired never feels like he is resting.  Review of Systems   GEN- denies fatigue, fever, weight loss,weakness, recent illness HEENT- denies eye drainage, change in vision, nasal discharge, CVS- denies chest pain, palpitations RESP- denies SOB, cough, wheeze ABD- + N/V, change in stools, +abd pain GU- denies dysuria, hematuria, dribbling, incontinence MSK- + joint pain, muscle aches, injury Neuro- denies headache, dizziness, syncope, seizure activity      Objective:   Physical Exam  GEN- NAD, alert and oriented x3 HEENT- PERRL, EOMI, non injected sclera, pink conjunctiva, MMM, oropharynx clear Neck- Supple, no LAD CVS- RRR, no murmur RESP-CTAB ABD-NABS,soft, mild TTP lower quadrants, no rebound, no guarding,ND,no CVA tenderness, well healed  port scars EXT- No edema Pulses- Radial, DP- 2+       Assessment & Plan:

## 2012-12-25 NOTE — Assessment & Plan Note (Signed)
Now resolved.  

## 2012-12-25 NOTE — Assessment & Plan Note (Signed)
He continues to have chronic abdominal pain. There was concern that he was having recurrent small bowel obstructions. He has an appointment with GI upcoming. I will repeat the lipase as well as a CBC and metabolic panel today. He's been given Phenergan for the nausea and vomiting.

## 2013-01-14 DIAGNOSIS — G4730 Sleep apnea, unspecified: Secondary | ICD-10-CM | POA: Diagnosis not present

## 2013-01-14 DIAGNOSIS — R0602 Shortness of breath: Secondary | ICD-10-CM | POA: Diagnosis not present

## 2013-01-14 DIAGNOSIS — I10 Essential (primary) hypertension: Secondary | ICD-10-CM | POA: Diagnosis not present

## 2013-01-19 ENCOUNTER — Encounter: Payer: Self-pay | Admitting: Internal Medicine

## 2013-01-20 ENCOUNTER — Ambulatory Visit (INDEPENDENT_AMBULATORY_CARE_PROVIDER_SITE_OTHER): Payer: Medicare Other | Admitting: Gastroenterology

## 2013-01-20 ENCOUNTER — Encounter: Payer: Self-pay | Admitting: Gastroenterology

## 2013-01-20 VITALS — BP 125/71 | HR 58 | Temp 97.6°F | Ht 66.0 in | Wt 147.4 lb

## 2013-01-20 DIAGNOSIS — K219 Gastro-esophageal reflux disease without esophagitis: Secondary | ICD-10-CM

## 2013-01-20 DIAGNOSIS — R109 Unspecified abdominal pain: Secondary | ICD-10-CM

## 2013-01-20 DIAGNOSIS — F1290 Cannabis use, unspecified, uncomplicated: Secondary | ICD-10-CM

## 2013-01-20 DIAGNOSIS — G8929 Other chronic pain: Secondary | ICD-10-CM

## 2013-01-20 DIAGNOSIS — K5900 Constipation, unspecified: Secondary | ICD-10-CM | POA: Insufficient documentation

## 2013-01-20 DIAGNOSIS — K56609 Unspecified intestinal obstruction, unspecified as to partial versus complete obstruction: Secondary | ICD-10-CM

## 2013-01-20 DIAGNOSIS — F1210 Cannabis abuse, uncomplicated: Secondary | ICD-10-CM

## 2013-01-20 MED ORDER — POLYETHYLENE GLYCOL 3350 17 GM/SCOOP PO POWD: 17.0000 g | Freq: Every day | ORAL | Status: DC

## 2013-01-20 MED ORDER — DEXLANSOPRAZOLE 60 MG PO CPDR: 60.0000 mg | DELAYED_RELEASE_CAPSULE | Freq: Every day | ORAL | Status: DC

## 2013-01-20 NOTE — Assessment & Plan Note (Addendum)
52 year old gentleman with recent partial small bowel obstruction requiring hospitalization. Suspect related to adhesions. Clinically improved. Baseline has chronic abdominal pain and intermittent vomiting. Prior extensive evaluation as outlined. Does have a history of gastritis on prior EGD. Currently having breakthrough heartburn on Prilosec. Switch to Dexilant 60mg  daily. Discussed need to continue with good bowel regimen. Switch from Colace to MiraLax 17 g daily. With intermittent vomiting, possibly due to intermittent partial obstruction versus gastritis versus GERD but cannot rule out cannabinoids hyperemesis syndrome. Advised he quit marijuana use.  Elevated lipase likely insignificant.  Office visit in 4 weeks to reassess. Call sooner with problems.

## 2013-01-20 NOTE — Patient Instructions (Addendum)
1. Stop Prilosec. Begin Dexilant one capsule daily. Prescription sent to [facility]. 2. Stop Colace (stool softner). Begin Miralax one capful daily. Prescription sent to [facility]. 3. Office visit in four weeks. 4. Stop smoking marijuana. This may be causing your nausea and vomiting. The vomiting can increase with ongoing use regardless of how frequently you use it.

## 2013-01-20 NOTE — Progress Notes (Signed)
Primary Care Physician: Milinda Antis, MD  Primary Gastroenterologist:  Roetta Sessions, MD   Chief Complaint  Patient presents with  . Follow-up    HPI: Brandon Lawrence is a 52 y.o. male here for hospital f/u. Recent admission for partial SBO. We were not consult during this hospital stay. Patient last seen at time of EGD and colonoscopy in October 2013. See past surgical history for details. At that time he was found to have renal cell carcinoma and a subsequently underwent partial left nephrectomy. Patient tells me he had extensive adhesions noted at time of the surgery. H/o chronic abdominal pain, GERD, marijuana use.   Complains of chronic abdominal pain. Never pain free. Some days worse than others. Associated with intermittent vomiting. Recently he had severe colicky abdominal pain associated with N/V. CT scan with contrast showed dilated proximal small bowel 4.2 cm, including duodenum and proximal jejunum with angulation of small bowel loops in the upper abdomen possibly related to adhesions. Patient was treated conservatively. Since discharge he still takes phenergan for nausea. Pain not as bad now. Stool softner/coffee still having lumps of stool. Having to strain a lot. No melena, brbpr. Lot of heartburn since partial SBO. Prilosec not helping a lot. No diarrhea.   No etoh. Smokes 2 marijuana joints per day. Has done so since the late 1990s.  States abdominal pain for 30 years. Has to keep something on stomach.    Lipase up at 126 and 84 in 12/2012. ?significance.  Current Outpatient Prescriptions  Medication Sig Dispense Refill  . acetaminophen (TYLENOL) 325 MG tablet Take 2 tablets (650 mg total) by mouth every 6 (six) hours as needed for pain.      [name] docusate sodium (COLACE) 100 MG capsule Take 100 mg by mouth 2 (two) times daily.      [name] omeprazole (PRILOSEC) 40 MG capsule Take 1 capsule (40 mg total) by mouth daily.  30 capsule  6  . promethazine (PHENERGAN) 12.5 MG tablet Take  1 tablet (12.5 mg total) by mouth every 8 (eight) hours as needed for nausea.  30 tablet  1   No current facility-administered medications for this visit.    Allergies as of 01/20/2013  . (No Known Allergies)   Past Surgical History  Procedure Laterality Date  . Cholecystectomy      Dr. Katrinka Blazing. Pt states "punctured intestines".   . Cystectomy      Ganglion- right wrist  . Robotic assited partial nephrectomy  04/22/2012    Procedure: ROBOTIC ASSITED PARTIAL NEPHRECTOMY;  Surgeon: Sebastian Ache, MD;  Location: [facility];  Service: Urology;  Laterality: Left;  . Laparoscopic lysis of adhesions  04/22/2012    Procedure: LAPAROSCOPIC LYSIS OF ADHESIONS;  Surgeon: Sebastian Ache, MD;  Location: [facility];  Service: Urology;  Laterality: N/A;  Extensive lysis of adhesions  . Esophagogastroduodenoscopy  02/13/2012    RMR: Hiatal hernia. Abnormal gastric mucosa-of uncertain significance-status post biopsy (no h.pylori  . Colonoscopy  02/13/2012    ZOX:[id number] polyp (1)-removed as described above (tubular adenoma)Next TCS 02/2017.    ROS:  General: Negative for anorexia, weight loss, fever, chills, fatigue, weakness. ENT: Negative for hoarseness, difficulty swallowing , nasal congestion. CV: Negative for chest pain, angina, palpitations, dyspnea on exertion, peripheral edema.  Respiratory: Negative for dyspnea at rest, dyspnea on exertion, cough, sputum, wheezing.  GI: See history of present illness. GU:  Negative for dysuria, hematuria, urinary incontinence, urinary frequency, nocturnal urination.  Endo: Negative for unusual weight change.  Physical Examination:   BP 125/71  Pulse 58  Temp(Src) 97.6 F (36.4 C) (Oral)  Ht 5\' 6"  (1.676 m)  Wt 147 lb 6.4 oz (66.86 kg)  BMI 23.8 kg/m2  General: Well-nourished, well-developed in no acute distress.  Eyes: No icterus. Mouth: Oropharyngeal mucosa moist and pink , no lesions erythema or exudate. Lungs: Clear to auscultation bilaterally.   Heart: Regular rate and rhythm, no murmurs rubs or gallops.  Abdomen: Bowel sounds are normal, nontender, nondistended, no hepatosplenomegaly or masses, no abdominal bruits or hernia , no rebound or guarding.   Extremities: No lower extremity edema. No clubbing or deformities. Neuro: Alert and oriented x 4   Skin: Warm and dry, no jaundice.   Psych: Alert and cooperative, normal mood and affect.  Labs:  Lab Results  Component Value Date   WBC 7.9 12/25/2012   HGB 14.0 12/25/2012   HCT 39.4 12/25/2012   MCV 89.5 12/25/2012   PLT 247 12/25/2012   Lab Results  Component Value Date   LIPASE 84* 12/25/2012   Lab Results  Component Value Date   CREATININE 1.25 12/25/2012   BUN 18 12/25/2012   NA 138 12/25/2012   K 4.3 12/25/2012   CL 101 12/25/2012   CO2 28 12/25/2012   Lab Results  Component Value Date   ALT 17 12/16/2012   AST 22 12/16/2012   ALKPHOS 47 12/16/2012   BILITOT 0.5 12/16/2012    Imaging Studies: No results found.

## 2013-01-21 ENCOUNTER — Other Ambulatory Visit (HOSPITAL_COMMUNITY): Payer: Self-pay

## 2013-01-21 DIAGNOSIS — G4730 Sleep apnea, unspecified: Secondary | ICD-10-CM

## 2013-01-21 NOTE — Progress Notes (Signed)
CC'd to PCP 

## 2013-01-27 ENCOUNTER — Ambulatory Visit: Payer: Medicare Other | Attending: Pulmonary Disease | Admitting: Sleep Medicine

## 2013-01-27 VITALS — Ht 66.0 in | Wt 150.0 lb

## 2013-01-27 DIAGNOSIS — G4733 Obstructive sleep apnea (adult) (pediatric): Secondary | ICD-10-CM | POA: Diagnosis not present

## 2013-01-27 DIAGNOSIS — G4730 Sleep apnea, unspecified: Secondary | ICD-10-CM

## 2013-02-05 ENCOUNTER — Ambulatory Visit (INDEPENDENT_AMBULATORY_CARE_PROVIDER_SITE_OTHER): Payer: Medicare Other | Admitting: Family Medicine

## 2013-02-05 ENCOUNTER — Encounter: Payer: Self-pay | Admitting: Family Medicine

## 2013-02-05 VITALS — BP 142/86 | HR 78 | Temp 97.2°F | Resp 18 | Wt 141.0 lb

## 2013-02-05 DIAGNOSIS — G4730 Sleep apnea, unspecified: Secondary | ICD-10-CM

## 2013-02-05 DIAGNOSIS — R030 Elevated blood-pressure reading, without diagnosis of hypertension: Secondary | ICD-10-CM

## 2013-02-05 MED ORDER — PROMETHAZINE HCL 12.5 MG PO TABS: 12.5000 mg | ORAL_TABLET | Freq: Three times a day (TID) | ORAL | Status: DC | PRN

## 2013-02-05 NOTE — Procedures (Signed)
[facility] Kaycee Haycraft A. Gerilyn Pilgrim, MD     [email or web address]        NAME:  Brandon Lawrence, Brandon Lawrence              ACCOUNT NO.:  [id number]  MEDICAL RECORD NO.:  [id number]          PATIENT TYPE:  OUT  LOCATION:  SLEEP LAB                     FACILITY:  [facility]  PHYSICIAN:  Taiyana Kissler A. Gerilyn Pilgrim, M.D. DATE OF BIRTH:  [birth year]  DATE OF STUDY:  01/27/2013                           NOCTURNAL POLYSOMNOGRAM  REFERRING PHYSICIAN:  Ramon Dredge L. Juanetta Gosling, M.D.  INDICATION:  A 52 year old man who presents with hypersomnia, daily headaches, and snoring.   MEDICATIONS:  Dexilant, MiraLAX.  EPWORTH SLEEPINESS SCALE:  16.  BMI:  24.  ARCHITECTURAL SUMMARY:  The total recording time is 363 minutes.  Sleep efficiency is 64%, sleep latency 22 minutes.  REM latency 85 minutes. Stage N1 30%, N2 53%, N3 0%, and REM sleep 17%.  RESPIRATORY SUMMARY:  Baseline oxygen saturation is 99, lowest saturation 93 during non-REM sleep.  Diagnostic AHI is 9 and RDI 11.  LIMB MOVEMENT SUMMARY:  PLM index 24.  ELECTROCARDIOGRAM SUMMARY:  Average heart rate is 53 with no significant dysrhythmias observed.  IMPRESSION:  Mild obstructive sleep apnea syndrome, not requiring positive pressure treatment.  Given the significant hypersomnia, consider sleep consultation for further evaluation for other causes of hypersomnia including narcolepsy or idiopathic hypersomnia.  Thank you for this referral.      Minas Bonser A. Gerilyn Pilgrim, M.D.    KAD/MEDQ  D:  02/05/2013 08:50:25  T:  02/05/2013 40:98:11  Job:  [id number]

## 2013-02-05 NOTE — Patient Instructions (Addendum)
Continue current medications Check blood pressure at [facility]- Call if blood pressure runs high > 140/90 Flu shot given F/U 3 month s

## 2013-02-05 NOTE — Progress Notes (Signed)
  Subjective:    Patient ID: Brandon Lawrence, male    DOB: [birth year], 52 y.o.   MRN: [id number]  HPI   Pt here to f/u elevated BP and medications. Reviewed GI Note Discussed with pt importance of regular BM, he is not using miralax on regular basis, did not know he was suppose to. No new concerns today    Review of Systems   GEN- denies fatigue, fever, weight loss,weakness, recent illness HEENT- denies eye drainage, change in vision, nasal discharge, CVS- denies chest pain, palpitations RESP- denies SOB, cough, wheeze ABD- denies N/V, change in stools, abd pain MSK- denies joint pain, muscle aches, injury Neuro- denies headache, dizziness, syncope, seizure activity      Objective:   Physical Exam  GEN- NAD, alert and oriented x3 CVS- RRR, no murmur RESP-CTAB ABD-NABS,soft,NTND EXT- No edema Pulses- Radial, DP- 2+       Assessment & Plan:

## 2013-02-07 ENCOUNTER — Encounter: Payer: Self-pay | Admitting: Family Medicine

## 2013-02-07 NOTE — Assessment & Plan Note (Signed)
Continue to monitor BP, will not start meds at this time He will monitor at local drug store as well

## 2013-02-11 ENCOUNTER — Ambulatory Visit (INDEPENDENT_AMBULATORY_CARE_PROVIDER_SITE_OTHER): Payer: Medicare Other | Admitting: Otolaryngology

## 2013-02-11 ENCOUNTER — Encounter (INDEPENDENT_AMBULATORY_CARE_PROVIDER_SITE_OTHER): Payer: Self-pay

## 2013-02-11 DIAGNOSIS — R599 Enlarged lymph nodes, unspecified: Secondary | ICD-10-CM

## 2013-02-19 ENCOUNTER — Ambulatory Visit: Payer: Medicare Other | Admitting: Gastroenterology

## 2013-03-03 ENCOUNTER — Encounter: Payer: Self-pay | Admitting: Gastroenterology

## 2013-03-03 ENCOUNTER — Ambulatory Visit (INDEPENDENT_AMBULATORY_CARE_PROVIDER_SITE_OTHER): Payer: Medicare Other | Admitting: Gastroenterology

## 2013-03-03 ENCOUNTER — Encounter (INDEPENDENT_AMBULATORY_CARE_PROVIDER_SITE_OTHER): Payer: Self-pay

## 2013-03-03 VITALS — BP 134/69 | HR 59 | Temp 97.3°F | Wt 153.8 lb

## 2013-03-03 DIAGNOSIS — R748 Abnormal levels of other serum enzymes: Secondary | ICD-10-CM | POA: Diagnosis not present

## 2013-03-03 DIAGNOSIS — K219 Gastro-esophageal reflux disease without esophagitis: Secondary | ICD-10-CM

## 2013-03-03 DIAGNOSIS — R112 Nausea with vomiting, unspecified: Secondary | ICD-10-CM | POA: Diagnosis not present

## 2013-03-03 DIAGNOSIS — K5900 Constipation, unspecified: Secondary | ICD-10-CM | POA: Diagnosis not present

## 2013-03-03 DIAGNOSIS — G8929 Other chronic pain: Secondary | ICD-10-CM

## 2013-03-03 DIAGNOSIS — R109 Unspecified abdominal pain: Secondary | ICD-10-CM

## 2013-03-03 NOTE — Patient Instructions (Signed)
1. Please have your labs done. 2. Continue Dexilant. 3. Decrease Miralax to every other day if you have to many stools/watery stools. 4. Office visit in six months with Dr. Jena Gauss.

## 2013-03-03 NOTE — Assessment & Plan Note (Signed)
Much better on MiraLax. Actually having 3-4 loose stools daily now. Advised him to cut back to every other day to see how he does.

## 2013-03-03 NOTE — Progress Notes (Signed)
Primary Care Physician: Milinda Antis, MD  Primary Gastroenterologist:  Roetta Sessions, MD   Chief Complaint  Patient presents with  . Follow-up    HPI: Brandon Lawrence is a 52 y.o. male here for four-week followup. He was seen back in September for hospital followup for partial small bowel obstruction requiring admission in August 2014. He has a history of chronic abdominal pain, GERD, marijuana use. Prior EGD and colonoscopy in October 2013 as previously outlined. He has slightly abnormal lipase during the hospitalization of unclear significance. At time of last office visit he was having some nausea/vomiting and GERD symptoms. We switched him from Prilosec to Dexilant. Started on MiraLax for a good bowel regimen.  Patient presents today stating that he feels much better. Heartburn well-controlled on Dexilant. Miralax helping BMs a lot.  Abdominal pain is much improved. He gets occasional pain which may last for less than 5 minutes, tends to be worse with greasy/fatty foods.  Weight up six pounds. No etoh use. Cath has tried tobacco marijuana use.    Current Outpatient Prescriptions  Medication Sig Dispense Refill  . acetaminophen (TYLENOL) 325 MG tablet Take 2 tablets (650 mg total) by mouth every 6 (six) hours as needed for pain.      [name] dexlansoprazole (DEXILANT) 60 MG capsule Take 1 capsule (60 mg total) by mouth daily.  31 capsule  11  . polyethylene glycol powder (GLYCOLAX/MIRALAX) powder Take 17 g by mouth daily.  527 g  3  . promethazine (PHENERGAN) 12.5 MG tablet Take 1 tablet (12.5 mg total) by mouth every 8 (eight) hours as needed for nausea.  30 tablet  1   No current facility-administered medications for this visit.    Allergies as of 03/03/2013  . (No Known Allergies)    ROS:  General: Negative for anorexia, weight loss, fever, chills, fatigue, weakness. ENT: Negative for hoarseness, difficulty swallowing , nasal congestion. CV: Negative for chest pain, angina,  palpitations, dyspnea on exertion, peripheral edema.  Respiratory: Negative for dyspnea at rest, dyspnea on exertion, cough, sputum, wheezing.  GI: See history of present illness. GU:  Negative for dysuria, hematuria, urinary incontinence, urinary frequency, nocturnal urination.  Endo: Negative for unusual weight change.    Physical Examination:   BP 134/69  Pulse 59  Temp(Src) 97.3 F (36.3 C) (Oral)  Wt 153 lb 12.8 oz (69.763 kg)  BMI 24.84 kg/m2  General: Well-nourished, well-developed in no acute distress.  Eyes: No icterus. Mouth: Oropharyngeal mucosa moist and pink , no lesions erythema or exudate. Lungs: Clear to auscultation bilaterally.  Heart: Regular rate and rhythm, no murmurs rubs or gallops.  Abdomen: Bowel sounds are normal, nontender, nondistended, no hepatosplenomegaly or masses, no abdominal bruits or hernia , no rebound or guarding.   Extremities: No lower extremity edema. No clubbing or deformities. Neuro: Alert and oriented x 4   Skin: Warm and dry, no jaundice.   Psych: Alert and cooperative, normal mood and affect.

## 2013-03-03 NOTE — Progress Notes (Signed)
cc'd to pcp 

## 2013-03-03 NOTE — Assessment & Plan Note (Signed)
Chronic abdominal pain, much better at this time. Pain is self-limited, last for less than 5 minutes at a time. GERD better controlled on Dexilant. Given recent elevated lipase, we will follow up on that. Pancreas appeared normal on recent CT and on MRI Abd last year.

## 2013-03-22 DIAGNOSIS — R748 Abnormal levels of other serum enzymes: Secondary | ICD-10-CM | POA: Diagnosis not present

## 2013-03-22 DIAGNOSIS — K219 Gastro-esophageal reflux disease without esophagitis: Secondary | ICD-10-CM | POA: Diagnosis not present

## 2013-03-22 DIAGNOSIS — R112 Nausea with vomiting, unspecified: Secondary | ICD-10-CM | POA: Diagnosis not present

## 2013-03-22 DIAGNOSIS — K5900 Constipation, unspecified: Secondary | ICD-10-CM | POA: Diagnosis not present

## 2013-05-10 ENCOUNTER — Ambulatory Visit: Payer: Medicare Other | Admitting: Family Medicine

## 2013-05-19 ENCOUNTER — Encounter: Payer: Self-pay | Admitting: Family Medicine

## 2013-05-19 ENCOUNTER — Ambulatory Visit (INDEPENDENT_AMBULATORY_CARE_PROVIDER_SITE_OTHER): Payer: Medicare Other | Admitting: Family Medicine

## 2013-05-19 VITALS — BP 150/88 | HR 78 | Temp 98.4°F | Resp 18 | Ht 65.0 in | Wt 155.0 lb

## 2013-05-19 DIAGNOSIS — G5600 Carpal tunnel syndrome, unspecified upper limb: Secondary | ICD-10-CM | POA: Diagnosis not present

## 2013-05-19 DIAGNOSIS — K219 Gastro-esophageal reflux disease without esophagitis: Secondary | ICD-10-CM | POA: Diagnosis not present

## 2013-05-19 DIAGNOSIS — R109 Unspecified abdominal pain: Secondary | ICD-10-CM

## 2013-05-19 DIAGNOSIS — I10 Essential (primary) hypertension: Secondary | ICD-10-CM

## 2013-05-19 DIAGNOSIS — G8929 Other chronic pain: Secondary | ICD-10-CM

## 2013-05-19 MED ORDER — HYDROCHLOROTHIAZIDE 12.5 MG PO TABS: 12.5000 mg | ORAL_TABLET | Freq: Every day | ORAL | Status: DC

## 2013-05-19 MED ORDER — DEXLANSOPRAZOLE 60 MG PO CPDR: 60.0000 mg | DELAYED_RELEASE_CAPSULE | Freq: Every day | ORAL | Status: DC

## 2013-05-19 MED ORDER — PROMETHAZINE HCL 12.5 MG PO TABS: 12.5000 mg | ORAL_TABLET | Freq: Three times a day (TID) | ORAL | Status: DC | PRN

## 2013-05-19 MED ORDER — POLYETHYLENE GLYCOL 3350 17 GM/SCOOP PO POWD: 17.0000 g | Freq: Every day | ORAL | Status: DC

## 2013-05-19 NOTE — Assessment & Plan Note (Signed)
Unchanged continues to get pain on and off. Reiterate use of his bowel regimen and his GERD medication

## 2013-05-19 NOTE — Progress Notes (Signed)
   Subjective:    Patient ID: Brandon Lawrence, male    DOB: 03/11/1961, 53 y.o.   MRN: [id number]  HPI Patient here to followup chronic medical problems. He states when he does not take his medication he continues to get upset stomach and sometimes and chest discomfort. He's supposed to be on Dexilant 60 mg. Unfortunately he continues to use marijuana. He does describe an incident where he fell in hit his right side he had some soreness and pain on that side but it is now resolved.  He also complains of numbness and tingling in his left hand mostly in the first 3 digits. He also gets weakness in the wrist and arm. This is the hand that he uses his cane in. He is being going on for about 3 months. He is predominantly right-handed.  Hypertension-he's had 2 elevated blood pressures on different visits. He returns today to have his blood pressure rechecked.  Review of Systems - per above   GEN- denies fatigue, fever, weight loss,weakness, recent illness HEENT- denies eye drainage, change in vision, nasal discharge, CVS- denies chest pain, palpitations RESP- denies SOB, cough, wheeze ABD- denies N/V, change in stools, +abd pain GU- denies dysuria, hematuria, dribbling, incontinence MSK-+joint pain, muscle aches, injury Neuro- Occ headache,  Denies dizziness, syncope, seizure activity      Objective:   Physical Exam GEN- NAD, alert and oriented x3 HEENT- PERRL, EOMI, non injected sclera, pink conjunctiva, MMM, oropharynx clear Neck- Supple, FROM, neg spurlings CVS- RRR, no murmur RESP-CTAB ABD-NABS,soft, NT,ND  Neuro- CNII-XII in tact, sensation grossly in tact, neg prayer+ left, Phalen's, neg tinels- Left side EXT- No edema Pulses- Radial  2+        Assessment & Plan:

## 2013-05-19 NOTE — Assessment & Plan Note (Signed)
I'm concerned for carpal tunnel in the left hand likely due to the use with his cane. We will try him on a brace for the left hand and see how he does. He will return in 6 weeks to followup his blood pressure over he says at bedtime to see if nerve conduction studies are needed

## 2013-05-19 NOTE — Patient Instructions (Signed)
Start blood pressure medication once a day in the morning Use the brace on your left hand for carpal tunnel F/U 6 weeks

## 2013-05-19 NOTE — Assessment & Plan Note (Signed)
Reiterate importance of excellent use on a regular basis

## 2013-05-19 NOTE — Assessment & Plan Note (Signed)
Start hydrochlorothiazide 12.5 mg daily

## 2013-06-30 ENCOUNTER — Ambulatory Visit: Payer: Medicare Other | Admitting: Family Medicine

## 2013-07-06 ENCOUNTER — Encounter: Payer: Self-pay | Admitting: Family Medicine

## 2013-07-06 ENCOUNTER — Ambulatory Visit (INDEPENDENT_AMBULATORY_CARE_PROVIDER_SITE_OTHER): Payer: Medicare Other | Admitting: Family Medicine

## 2013-07-06 VITALS — BP 142/78 | HR 62 | Temp 98.1°F | Resp 14 | Ht 63.0 in | Wt 161.0 lb

## 2013-07-06 DIAGNOSIS — M129 Arthropathy, unspecified: Secondary | ICD-10-CM | POA: Diagnosis not present

## 2013-07-06 DIAGNOSIS — N400 Enlarged prostate without lower urinary tract symptoms: Secondary | ICD-10-CM | POA: Diagnosis not present

## 2013-07-06 DIAGNOSIS — I10 Essential (primary) hypertension: Secondary | ICD-10-CM

## 2013-07-06 DIAGNOSIS — R300 Dysuria: Secondary | ICD-10-CM | POA: Diagnosis not present

## 2013-07-06 DIAGNOSIS — M1990 Unspecified osteoarthritis, unspecified site: Secondary | ICD-10-CM

## 2013-07-06 LAB — URINALYSIS, ROUTINE W REFLEX MICROSCOPIC
Bilirubin Urine: NEGATIVE
GLUCOSE, UA: NEGATIVE mg/dL
HGB URINE DIPSTICK: NEGATIVE
KETONES UR: NEGATIVE mg/dL
LEUKOCYTES UA: NEGATIVE
NITRITE: NEGATIVE
PH: 6 (ref 5.0–8.0)
Protein, ur: NEGATIVE mg/dL
SPECIFIC GRAVITY, URINE: 1.025 (ref 1.005–1.030)
Urobilinogen, UA: 0.2 mg/dL (ref 0.0–1.0)

## 2013-07-06 MED ORDER — TRAMADOL HCL 50 MG PO TABS: 50.0000 mg | ORAL_TABLET | Freq: Three times a day (TID) | ORAL | Status: DC | PRN

## 2013-07-06 MED ORDER — DOXAZOSIN MESYLATE ER 4 MG PO TB24: 4.0000 mg | ORAL_TABLET | Freq: Every day | ORAL | Status: DC

## 2013-07-06 MED ORDER — TRIAMCINOLONE ACETONIDE 0.1 % EX CREA: 1.0000 "application " | TOPICAL_CREAM | Freq: Two times a day (BID) | CUTANEOUS | Status: DC

## 2013-07-06 NOTE — Assessment & Plan Note (Signed)
Trial of Cardura to see if this helps but his prostate symptoms as well as his blood pressure
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
## 2013-07-06 NOTE — Assessment & Plan Note (Signed)
He has some BPH symptoms. He was seen by urology in the past but they cannot find a true cause of his scrotal pain. We'll try him on the Cardura as this will help his blood pressure will also help with any enlargement of the prostate. Urinalysis also done

## 2013-07-06 NOTE — Patient Instructions (Signed)
Start cardura once a day Use cream on your legs as needed Use pain medication for hip and arthritis Continue all other medications F/U 2 months

## 2013-07-07 ENCOUNTER — Telehealth: Payer: Self-pay | Admitting: *Deleted

## 2013-07-07 LAB — GC/CHLAMYDIA PROBE AMP, URINE
Chlamydia, Swab/Urine, PCR: NEGATIVE
GC PROBE AMP, URINE: NEGATIVE

## 2013-07-07 NOTE — Telephone Encounter (Signed)
Received fax from pharmacy requesting PA for Cardura. PA submitted.

## 2013-07-08 NOTE — Telephone Encounter (Signed)
PA approve through 05/05/2014.  [id number].

## 2013-08-26 ENCOUNTER — Ambulatory Visit (INDEPENDENT_AMBULATORY_CARE_PROVIDER_SITE_OTHER): Payer: Medicare Other | Admitting: Otolaryngology

## 2013-09-06 ENCOUNTER — Ambulatory Visit: Payer: Medicare Other | Admitting: Family Medicine

## 2013-09-14 ENCOUNTER — Encounter (INDEPENDENT_AMBULATORY_CARE_PROVIDER_SITE_OTHER): Payer: Self-pay

## 2013-09-14 ENCOUNTER — Encounter: Payer: Self-pay | Admitting: Internal Medicine

## 2013-09-14 ENCOUNTER — Ambulatory Visit (INDEPENDENT_AMBULATORY_CARE_PROVIDER_SITE_OTHER): Payer: Medicare Other | Admitting: Internal Medicine

## 2013-09-14 VITALS — BP 148/82 | HR 58 | Temp 98.0°F | Ht 66.0 in | Wt 153.0 lb

## 2013-09-14 DIAGNOSIS — K219 Gastro-esophageal reflux disease without esophagitis: Secondary | ICD-10-CM | POA: Diagnosis not present

## 2013-09-14 DIAGNOSIS — K5900 Constipation, unspecified: Secondary | ICD-10-CM | POA: Diagnosis not present
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
## 2013-09-14 NOTE — Patient Instructions (Addendum)
GERD information provided  Continue Dexilant daily  Office visit in 1 year  Colonoscopy 2018

## 2013-09-21 ENCOUNTER — Ambulatory Visit (INDEPENDENT_AMBULATORY_CARE_PROVIDER_SITE_OTHER): Payer: Medicare Other | Admitting: Family Medicine

## 2013-09-21 ENCOUNTER — Encounter: Payer: Self-pay | Admitting: Family Medicine

## 2013-09-21 VITALS — BP 138/76 | HR 78 | Temp 97.7°F | Resp 14 | Ht 65.0 in | Wt 148.0 lb

## 2013-09-21 DIAGNOSIS — N400 Enlarged prostate without lower urinary tract symptoms: Secondary | ICD-10-CM

## 2013-09-21 DIAGNOSIS — M1990 Unspecified osteoarthritis, unspecified site: Secondary | ICD-10-CM

## 2013-09-21 DIAGNOSIS — M129 Arthropathy, unspecified: Secondary | ICD-10-CM | POA: Diagnosis not present

## 2013-09-21 DIAGNOSIS — Z125 Encounter for screening for malignant neoplasm of prostate: Secondary | ICD-10-CM | POA: Diagnosis not present

## 2013-09-21 DIAGNOSIS — J309 Allergic rhinitis, unspecified: Secondary | ICD-10-CM

## 2013-09-21 DIAGNOSIS — I10 Essential (primary) hypertension: Secondary | ICD-10-CM | POA: Diagnosis not present

## 2013-09-21 DIAGNOSIS — J302 Other seasonal allergic rhinitis: Secondary | ICD-10-CM | POA: Insufficient documentation

## 2013-09-21 LAB — CBC WITH DIFFERENTIAL/PLATELET
BASOS ABS: 0 10*3/uL (ref 0.0–0.1)
Basophils Relative: 0 % (ref 0–1)
EOS PCT: 6 % — AB (ref 0–5)
Eosinophils Absolute: 0.4 10*3/uL (ref 0.0–0.7)
HCT: 37.7 % — ABNORMAL LOW (ref 39.0–52.0)
Hemoglobin: 13 g/dL (ref 13.0–17.0)
LYMPHS ABS: 3.2 10*3/uL (ref 0.7–4.0)
LYMPHS PCT: 47 % — AB (ref 12–46)
MCH: 30.2 pg (ref 26.0–34.0)
MCHC: 34.5 g/dL (ref 30.0–36.0)
MCV: 87.7 fL (ref 78.0–100.0)
Monocytes Absolute: 0.7 10*3/uL (ref 0.1–1.0)
Monocytes Relative: 10 % (ref 3–12)
NEUTROS ABS: 2.5 10*3/uL (ref 1.7–7.7)
Neutrophils Relative %: 37 % — ABNORMAL LOW (ref 43–77)
PLATELETS: 220 10*3/uL (ref 150–400)
RBC: 4.3 MIL/uL (ref 4.22–5.81)
RDW: 14.8 % (ref 11.5–15.5)
WBC: 6.8 10*3/uL (ref 4.0–10.5)

## 2013-09-21 LAB — LIPID PANEL
CHOL/HDL RATIO: 4.4 ratio
Cholesterol: 151 mg/dL (ref 0–200)
HDL: 34 mg/dL — ABNORMAL LOW (ref >39)
LDL Cholesterol: 104 mg/dL — ABNORMAL HIGH (ref 0–99)
Triglycerides: 63 mg/dL (ref <150)
VLDL: 13 mg/dL (ref 0–40)

## 2013-09-21 LAB — COMPREHENSIVE METABOLIC PANEL
ALT: 11 U/L (ref 0–53)
AST: 20 U/L (ref 0–37)
Albumin: 4.2 g/dL (ref 3.5–5.2)
Alkaline Phosphatase: 47 U/L (ref 39–117)
BUN: 15 mg/dL (ref 6–23)
CALCIUM: 9.4 mg/dL (ref 8.4–10.5)
CHLORIDE: 105 meq/L (ref 96–112)
CO2: 24 meq/L (ref 19–32)
CREATININE: 1.22 mg/dL (ref 0.50–1.35)
Glucose, Bld: 91 mg/dL (ref 70–99)
POTASSIUM: 4.1 meq/L (ref 3.5–5.3)
Sodium: 136 mEq/L (ref 135–145)
Total Bilirubin: 0.5 mg/dL (ref 0.2–1.2)
Total Protein: 7.3 g/dL (ref 6.0–8.3)

## 2013-09-21 MED ORDER — PROMETHAZINE HCL 12.5 MG PO TABS: 12.5000 mg | ORAL_TABLET | Freq: Three times a day (TID) | ORAL | Status: DC | PRN

## 2013-09-21 MED ORDER — FLUTICASONE PROPIONATE 50 MCG/ACT NA SUSP: 2.0000 | Freq: Every day | NASAL | Status: DC

## 2013-09-21 MED ORDER — DEXLANSOPRAZOLE 60 MG PO CPDR: 60.0000 mg | DELAYED_RELEASE_CAPSULE | Freq: Every day | ORAL | Status: DC

## 2013-09-21 MED ORDER — TRAMADOL HCL 50 MG PO TABS: 50.0000 mg | ORAL_TABLET | Freq: Three times a day (TID) | ORAL | Status: DC | PRN

## 2013-09-21 MED ORDER — DOXAZOSIN MESYLATE ER 4 MG PO TB24: 4.0000 mg | ORAL_TABLET | Freq: Every day | ORAL | Status: DC

## 2013-09-21 NOTE — Assessment & Plan Note (Signed)
Flonase when necessary

## 2013-09-21 NOTE — Assessment & Plan Note (Signed)
Doing well with tramadol will refill this

## 2013-09-21 NOTE — Assessment & Plan Note (Signed)
Doing well on the Cardura. I'll also check his PSA

## 2013-09-21 NOTE — Progress Notes (Signed)
Patient ID: Brandon Lawrence, male   DOB: July 11, 1960, 53 y.o.   MRN: [id number]   Subjective:    Patient ID: Brandon Lawrence, male    DOB: [birth year], 53 y.o.   MRN: [id number]  Patient presents for 2 month F/U  Patient here to followup hypertension. Chronic medical problems. He has no specific concerns he is tolerating his medications without any difficulties he is due for some medication refills as well as fasting labs and a PSA . I reviewed his last gastroenterology note there've been no changes he is to continue on Exelon as well as MiraLAX every other day for his constipation. He has had some seasonal allergies he said he gets a lot of nasal drainage and congestion he did try over-the-counter Zyrtec with minimal improvement   Review Of Systems:  GEN- denies fatigue, fever, weight loss,weakness, recent illness HEENT- denies eye drainage, change in vision,+ nasal discharge, CVS- denies chest pain, palpitations RESP- denies SOB, cough, wheeze ABD- denies N/V, change in stools, abd pain GU- denies dysuria, hematuria, dribbling, incontinence MSK- denies joint pain, muscle aches, injury Neuro- denies headache, dizziness, syncope, seizure activity       Objective:    BP 138/76  Pulse 78  Temp(Src) 97.7 F (36.5 C) (Oral)  Resp 14  Ht 5\' 5"  (1.651 m)  Wt 148 lb (67.132 kg)  BMI 24.63 kg/m2 GEN- NAD, alert and oriented x3 HEENT- PERRL, EOMI, non injected sclera, pink conjunctiva, MMM, oropharynx clear, nares inflammed turbinates, clear rhinorrhea Neck- Supple, no LAD CVS- RRR, no murmur RESP-CTAB ABD-NABS,soft,NT,ND EXT- No edema Pulses- Radial 2+        Assessment & Plan:      Problem List Items Addressed This Visit   Essential hypertension, benign - Primary   Relevant Orders      CBC with Differential      Comprehensive metabolic panel      Lipid panel   BPH (benign prostatic hypertrophy)   Relevant Medications      doxazosin (CARDURA XL) 24 hr tablet   Other  Relevant Orders      PSA, Medicare   Arthritis   Relevant Medications      traMADol (ULTRAM) 50 MG tablet    Other Visit Diagnoses   Prostate cancer screening        Relevant Orders       PSA, Medicare       Note: This dictation was prepared with Dragon dictation along with smaller phrase technology. Any transcriptional errors that result from this process are unintentional.

## 2013-09-21 NOTE — Assessment & Plan Note (Signed)
Well-controlled no change in medication

## 2013-09-21 NOTE — Patient Instructions (Signed)
Continue current medications We will call with lab results  F/U 4 months  

## 2013-09-22 LAB — PSA, MEDICARE: PSA: 1.08 ng/mL (ref <=4.00)

## 2013-09-23 ENCOUNTER — Ambulatory Visit (INDEPENDENT_AMBULATORY_CARE_PROVIDER_SITE_OTHER): Payer: Medicare Other | Admitting: Otolaryngology

## 2013-09-23 DIAGNOSIS — R599 Enlarged lymph nodes, unspecified: Secondary | ICD-10-CM | POA: Diagnosis not present

## 2013-11-08 DIAGNOSIS — D4959 Neoplasm of unspecified behavior of other genitourinary organ: Secondary | ICD-10-CM | POA: Diagnosis not present

## 2013-11-08 DIAGNOSIS — C649 Malignant neoplasm of unspecified kidney, except renal pelvis: Secondary | ICD-10-CM | POA: Diagnosis not present

## 2013-11-08 DIAGNOSIS — N400 Enlarged prostate without lower urinary tract symptoms: Secondary | ICD-10-CM | POA: Diagnosis not present

## 2013-11-08 DIAGNOSIS — Z125 Encounter for screening for malignant neoplasm of prostate: Secondary | ICD-10-CM | POA: Diagnosis not present

## 2013-11-22 DIAGNOSIS — N400 Enlarged prostate without lower urinary tract symptoms: Secondary | ICD-10-CM | POA: Diagnosis not present

## 2013-11-22 DIAGNOSIS — D4959 Neoplasm of unspecified behavior of other genitourinary organ: Secondary | ICD-10-CM | POA: Diagnosis not present

## 2014-01-21 ENCOUNTER — Ambulatory Visit: Payer: Medicare Other | Admitting: Family Medicine

## 2014-03-24 ENCOUNTER — Ambulatory Visit (INDEPENDENT_AMBULATORY_CARE_PROVIDER_SITE_OTHER): Payer: Medicare Other | Admitting: Otolaryngology

## 2014-03-24 DIAGNOSIS — R590 Localized enlarged lymph nodes: Secondary | ICD-10-CM | POA: Diagnosis not present

## 2014-04-06 ENCOUNTER — Telehealth: Payer: Self-pay | Admitting: Internal Medicine

## 2014-04-06 MED ORDER — POLYETHYLENE GLYCOL 3350 17 GM/SCOOP PO POWD: 17.0000 g | Freq: Every day | ORAL | Status: DC

## 2014-04-06 NOTE — Telephone Encounter (Signed)
Routing to the refill box. 

## 2014-04-06 NOTE — Telephone Encounter (Signed)
Completed.

## 2014-04-06 NOTE — Telephone Encounter (Signed)
PATIENT CAME INTO OFFICE NEEDING REFILL FOR MIRALAX.  HE NO LONGER HAS HIS PCP WHO WAS PRESCRIBING IT FOR HIM TO TAKE.

## 2014-04-06 NOTE — Addendum Note (Signed)
Addended by: Orvil Feil on: 04/06/2014 01:45 PM   Modules accepted: Orders

## 2014-04-07 ENCOUNTER — Other Ambulatory Visit: Payer: Self-pay | Admitting: Family Medicine

## 2014-04-07 NOTE — Telephone Encounter (Signed)
Ok to refill??  Last office visit/ refill 09/21/2013, #2 refills.   Patient discharged 09/27/2013??

## 2014-04-08 NOTE — Telephone Encounter (Signed)
okay

## 2014-04-08 NOTE — Telephone Encounter (Signed)
Patient has been discharged from practice.  Medication refill denied 

## 2014-06-10 DIAGNOSIS — K219 Gastro-esophageal reflux disease without esophagitis: Secondary | ICD-10-CM | POA: Diagnosis not present

## 2014-06-10 DIAGNOSIS — K5900 Constipation, unspecified: Secondary | ICD-10-CM | POA: Diagnosis not present

## 2014-06-10 DIAGNOSIS — Z720 Tobacco use: Secondary | ICD-10-CM | POA: Diagnosis not present

## 2014-06-10 DIAGNOSIS — I10 Essential (primary) hypertension: Secondary | ICD-10-CM | POA: Diagnosis not present

## 2014-06-14 DIAGNOSIS — I10 Essential (primary) hypertension: Secondary | ICD-10-CM | POA: Diagnosis not present

## 2014-06-14 DIAGNOSIS — F329 Major depressive disorder, single episode, unspecified: Secondary | ICD-10-CM | POA: Diagnosis not present

## 2014-06-14 DIAGNOSIS — R51 Headache: Secondary | ICD-10-CM | POA: Diagnosis not present

## 2014-06-14 DIAGNOSIS — K5900 Constipation, unspecified: Secondary | ICD-10-CM | POA: Diagnosis not present

## 2014-06-14 DIAGNOSIS — M549 Dorsalgia, unspecified: Secondary | ICD-10-CM | POA: Diagnosis not present

## 2014-06-14 DIAGNOSIS — N529 Male erectile dysfunction, unspecified: Secondary | ICD-10-CM | POA: Diagnosis not present

## 2014-06-28 ENCOUNTER — Encounter: Payer: Self-pay | Admitting: Family Medicine

## 2014-07-26 ENCOUNTER — Encounter: Payer: Self-pay | Admitting: Family Medicine

## 2014-08-12 ENCOUNTER — Other Ambulatory Visit (HOSPITAL_COMMUNITY): Payer: Self-pay | Admitting: Nurse Practitioner

## 2014-08-12 DIAGNOSIS — N529 Male erectile dysfunction, unspecified: Secondary | ICD-10-CM | POA: Diagnosis not present

## 2014-08-12 DIAGNOSIS — I10 Essential (primary) hypertension: Secondary | ICD-10-CM | POA: Diagnosis not present

## 2014-08-12 DIAGNOSIS — D509 Iron deficiency anemia, unspecified: Secondary | ICD-10-CM

## 2014-08-12 DIAGNOSIS — F329 Major depressive disorder, single episode, unspecified: Secondary | ICD-10-CM | POA: Diagnosis not present

## 2014-08-12 DIAGNOSIS — R1084 Generalized abdominal pain: Secondary | ICD-10-CM | POA: Diagnosis not present

## 2014-08-12 DIAGNOSIS — K920 Hematemesis: Secondary | ICD-10-CM | POA: Diagnosis not present

## 2014-08-12 DIAGNOSIS — D649 Anemia, unspecified: Secondary | ICD-10-CM | POA: Diagnosis not present

## 2014-08-16 ENCOUNTER — Encounter (HOSPITAL_COMMUNITY): Payer: Self-pay

## 2014-08-16 ENCOUNTER — Ambulatory Visit (HOSPITAL_COMMUNITY)
Admission: RE | Admit: 2014-08-16 | Discharge: 2014-08-16 | Disposition: A | Discharge status: Home / Self Care | Payer: Medicare Other | Source: Ambulatory Visit | Attending: Nurse Practitioner | Admitting: Nurse Practitioner

## 2014-08-16 DIAGNOSIS — D649 Anemia, unspecified: Secondary | ICD-10-CM | POA: Insufficient documentation

## 2014-08-16 DIAGNOSIS — Z85528 Personal history of other malignant neoplasm of kidney: Secondary | ICD-10-CM | POA: Diagnosis not present

## 2014-08-16 DIAGNOSIS — R109 Unspecified abdominal pain: Secondary | ICD-10-CM | POA: Insufficient documentation

## 2014-08-16 DIAGNOSIS — R112 Nausea with vomiting, unspecified: Secondary | ICD-10-CM | POA: Diagnosis not present

## 2014-08-16 DIAGNOSIS — Z905 Acquired absence of kidney: Secondary | ICD-10-CM | POA: Insufficient documentation

## 2014-08-16 DIAGNOSIS — D509 Iron deficiency anemia, unspecified: Secondary | ICD-10-CM

## 2014-08-16 DIAGNOSIS — Z9049 Acquired absence of other specified parts of digestive tract: Secondary | ICD-10-CM | POA: Insufficient documentation

## 2014-08-16 DIAGNOSIS — Z9889 Other specified postprocedural states: Secondary | ICD-10-CM | POA: Diagnosis not present

## 2014-08-16 LAB — POCT I-STAT CREATININE: Creatinine, Ser: 1.2 mg/dL (ref 0.50–1.35)

## 2014-08-16 MED ORDER — IOHEXOL 300 MG/ML  SOLN
100.0000 mL | Freq: Once | INTRAMUSCULAR | Status: AC | PRN
  Administered 2014-08-16: 100 mL via INTRAVENOUS

## 2014-08-18 DIAGNOSIS — D649 Anemia, unspecified: Secondary | ICD-10-CM | POA: Diagnosis not present

## 2014-08-24 ENCOUNTER — Encounter: Payer: Self-pay | Admitting: Internal Medicine

## 2014-08-26 DIAGNOSIS — R1031 Right lower quadrant pain: Secondary | ICD-10-CM | POA: Diagnosis not present

## 2014-08-26 DIAGNOSIS — D649 Anemia, unspecified: Secondary | ICD-10-CM | POA: Diagnosis not present

## 2014-08-26 DIAGNOSIS — R1084 Generalized abdominal pain: Secondary | ICD-10-CM | POA: Diagnosis not present

## 2014-08-26 DIAGNOSIS — R112 Nausea with vomiting, unspecified: Secondary | ICD-10-CM | POA: Diagnosis not present

## 2014-09-07 ENCOUNTER — Encounter: Payer: Self-pay | Admitting: Nurse Practitioner

## 2014-09-07 ENCOUNTER — Other Ambulatory Visit: Payer: Self-pay

## 2014-09-07 ENCOUNTER — Ambulatory Visit (INDEPENDENT_AMBULATORY_CARE_PROVIDER_SITE_OTHER): Payer: Medicare Other | Admitting: Nurse Practitioner

## 2014-09-07 VITALS — BP 147/87 | HR 60 | Temp 97.3°F | Ht 66.0 in | Wt 153.8 lb

## 2014-09-07 DIAGNOSIS — D649 Anemia, unspecified: Secondary | ICD-10-CM

## 2014-09-07 DIAGNOSIS — R1030 Lower abdominal pain, unspecified: Secondary | ICD-10-CM

## 2014-09-07 DIAGNOSIS — D509 Iron deficiency anemia, unspecified: Secondary | ICD-10-CM | POA: Insufficient documentation

## 2014-09-07 DIAGNOSIS — R110 Nausea: Secondary | ICD-10-CM

## 2014-09-07 DIAGNOSIS — K920 Hematemesis: Secondary | ICD-10-CM | POA: Diagnosis not present

## 2014-09-07 DIAGNOSIS — R109 Unspecified abdominal pain: Secondary | ICD-10-CM

## 2014-09-07 NOTE — Patient Instructions (Signed)
1. We will schedule your procedure (endoscopy) for you. 2. Further recommendations to be based on results your procedure. 3. Return for follow-up in 3 months.

## 2014-09-07 NOTE — Assessment & Plan Note (Signed)
Patient with mild anemia which is slightly depressed from his baseline (see history of present illness) along with associated nausea, vomiting, and hematemesis (see hematemesis assessment and plan note). We'll proceed with an endoscopy to further evaluate his esophagus, stomach, and duodenum for possible source of GI bleed. Return for follow-up in 3 months.

## 2014-09-07 NOTE — Assessment & Plan Note (Addendum)
Patient with progressive lower abdominal pain along with hematemesis, anemia, and history of GERD. Is currently on Dexilant 60 mg daily which he admits to taking regularly. Pain seems to be relieved by eating. Given this presentation along with hematemesis and and slight anemia, we'll proceed with an endoscopy to further evaluate his esophagus, stomach, and duodenum for possible GI bleed. Also admits to 1-2 episodes a week of possible melena. Has no gallbladder. CT imaging of abdomen and pelvis completed by PCP essentially normal from a GI standpoint.  Proceed with EGD with Dr. Gala Romney in near future: the risks, benefits, and alternatives have been discussed with the patient in detail. The patient states understanding and desires to proceed.  Patient is not on any anticoagulants, M.D. diabetes medications, her chronic pain or anxiety medications. Admits daily marijuana use and history of alcohol abuse. We'll proceed with 25 mg preprocedure Phenergan.

## 2014-09-07 NOTE — Assessment & Plan Note (Addendum)
54 year old male with a history of acid reflux currently on Dexilant 60 mg daily. Has noted some increased nausea and vomiting along with mild hematemesis with prolonged dry heaving after emptying his stomach with vomiting. Also admits abdominal pain which is often relieved by eating. Also admits one to 2 stools a week that are very dark and sticky, possible melena. Differentials include Mallory-Weiss tear, peptic ulcer disease, duodenal ulcer, erosive gastritis. However cannot rule out more insidious process. At this point we will proceed with an endoscopy to further evaluate his esophagus, stomach, and duodenum.   Proceed with EGD with Dr. Gala Romney in near future: the risks, benefits, and alternatives have been discussed with the patient in detail. The patient states understanding and desires to proceed.  Patient is not on any anticoagulants, M.D. diabetes medications, her chronic pain or anxiety medications. Admits daily marijuana use and history of alcohol abuse. We'll proceed with 25 mg preprocedure Phenergan.

## 2014-09-07 NOTE — Progress Notes (Signed)
cc'ed to pcp °
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
## 2014-09-15 ENCOUNTER — Ambulatory Visit (INDEPENDENT_AMBULATORY_CARE_PROVIDER_SITE_OTHER): Payer: Medicare Other | Admitting: Otolaryngology

## 2014-09-20 DIAGNOSIS — F419 Anxiety disorder, unspecified: Secondary | ICD-10-CM | POA: Diagnosis not present

## 2014-09-20 DIAGNOSIS — Z7251 High risk heterosexual behavior: Secondary | ICD-10-CM | POA: Diagnosis not present

## 2014-09-20 DIAGNOSIS — F329 Major depressive disorder, single episode, unspecified: Secondary | ICD-10-CM | POA: Diagnosis not present

## 2014-09-20 DIAGNOSIS — R300 Dysuria: Secondary | ICD-10-CM | POA: Diagnosis not present

## 2014-09-22 ENCOUNTER — Encounter (HOSPITAL_COMMUNITY): Payer: Self-pay | Admitting: *Deleted

## 2014-09-22 ENCOUNTER — Encounter (HOSPITAL_COMMUNITY)
Admission: RE | Disposition: A | Discharge status: Home / Self Care | Payer: Self-pay | Source: Ambulatory Visit | Attending: Internal Medicine

## 2014-09-22 ENCOUNTER — Ambulatory Visit (HOSPITAL_COMMUNITY)
Admission: RE | Admit: 2014-09-22 | Discharge: 2014-09-22 | Disposition: A | Discharge status: Home / Self Care | Payer: Medicare Other | Source: Ambulatory Visit | Attending: Internal Medicine | Admitting: Internal Medicine

## 2014-09-22 DIAGNOSIS — G4733 Obstructive sleep apnea (adult) (pediatric): Secondary | ICD-10-CM | POA: Diagnosis not present

## 2014-09-22 DIAGNOSIS — K920 Hematemesis: Secondary | ICD-10-CM | POA: Diagnosis present

## 2014-09-22 DIAGNOSIS — K449 Diaphragmatic hernia without obstruction or gangrene: Secondary | ICD-10-CM | POA: Diagnosis not present

## 2014-09-22 DIAGNOSIS — K3189 Other diseases of stomach and duodenum: Secondary | ICD-10-CM

## 2014-09-22 DIAGNOSIS — F1210 Cannabis abuse, uncomplicated: Secondary | ICD-10-CM | POA: Insufficient documentation

## 2014-09-22 DIAGNOSIS — M1990 Unspecified osteoarthritis, unspecified site: Secondary | ICD-10-CM | POA: Insufficient documentation

## 2014-09-22 DIAGNOSIS — R109 Unspecified abdominal pain: Secondary | ICD-10-CM | POA: Diagnosis not present

## 2014-09-22 DIAGNOSIS — K922 Gastrointestinal hemorrhage, unspecified: Secondary | ICD-10-CM | POA: Insufficient documentation

## 2014-09-22 DIAGNOSIS — Z87891 Personal history of nicotine dependence: Secondary | ICD-10-CM | POA: Insufficient documentation

## 2014-09-22 DIAGNOSIS — Z79899 Other long term (current) drug therapy: Secondary | ICD-10-CM | POA: Insufficient documentation

## 2014-09-22 DIAGNOSIS — K219 Gastro-esophageal reflux disease without esophagitis: Secondary | ICD-10-CM | POA: Diagnosis not present

## 2014-09-22 DIAGNOSIS — R110 Nausea: Secondary | ICD-10-CM

## 2014-09-22 DIAGNOSIS — Z9049 Acquired absence of other specified parts of digestive tract: Secondary | ICD-10-CM | POA: Insufficient documentation

## 2014-09-22 DIAGNOSIS — K319 Disease of stomach and duodenum, unspecified: Secondary | ICD-10-CM | POA: Diagnosis not present

## 2014-09-22 DIAGNOSIS — Z905 Acquired absence of kidney: Secondary | ICD-10-CM | POA: Insufficient documentation

## 2014-09-22 DIAGNOSIS — Z85528 Personal history of other malignant neoplasm of kidney: Secondary | ICD-10-CM | POA: Insufficient documentation

## 2014-09-22 DIAGNOSIS — D649 Anemia, unspecified: Secondary | ICD-10-CM

## 2014-09-22 HISTORY — DX: Depression, unspecified: F32.A

## 2014-09-22 HISTORY — PX: ESOPHAGOGASTRODUODENOSCOPY: SHX5428

## 2014-09-22 HISTORY — DX: Major depressive disorder, single episode, unspecified: F32.9

## 2014-09-22 SURGERY — EGD (ESOPHAGOGASTRODUODENOSCOPY)
Anesthesia: Moderate Sedation

## 2014-09-22 MED ORDER — STERILE WATER FOR IRRIGATION IR SOLN
Status: DC | PRN
  Administered 2014-09-22: 13:00:00

## 2014-09-22 MED ORDER — PROMETHAZINE HCL 25 MG/ML IJ SOLN
25.0000 mg | Freq: Once | INTRAMUSCULAR | Status: AC
  Administered 2014-09-22: 25 mg via INTRAVENOUS

## 2014-09-22 MED ORDER — LIDOCAINE VISCOUS 2 % MT SOLN
OROMUCOSAL | Status: DC | PRN
  Administered 2014-09-22: 3 mL via OROMUCOSAL

## 2014-09-22 MED ORDER — MEPERIDINE HCL 100 MG/ML IJ SOLN
INTRAMUSCULAR | Status: DC | PRN
  Administered 2014-09-22 (×2): 50 mg via INTRAVENOUS

## 2014-09-22 MED ORDER — ONDANSETRON HCL 4 MG/2ML IJ SOLN
INTRAMUSCULAR | Status: AC
  Filled 2014-09-22: qty 2

## 2014-09-22 MED ORDER — MEPERIDINE HCL 100 MG/ML IJ SOLN
INTRAMUSCULAR | Status: AC
  Filled 2014-09-22: qty 2

## 2014-09-22 MED ORDER — MIDAZOLAM HCL 5 MG/5ML IJ SOLN
INTRAMUSCULAR | Status: AC
  Filled 2014-09-22: qty 10

## 2014-09-22 MED ORDER — PROMETHAZINE HCL 25 MG/ML IJ SOLN
INTRAMUSCULAR | Status: AC
  Filled 2014-09-22: qty 1

## 2014-09-22 MED ORDER — LIDOCAINE VISCOUS 2 % MT SOLN
OROMUCOSAL | Status: AC
  Filled 2014-09-22: qty 15

## 2014-09-22 MED ORDER — MIDAZOLAM HCL 5 MG/5ML IJ SOLN
INTRAMUSCULAR | Status: DC | PRN
  Administered 2014-09-22 (×2): 2 mg via INTRAVENOUS

## 2014-09-22 MED ORDER — SODIUM CHLORIDE 0.9 % IJ SOLN
INTRAMUSCULAR | Status: AC
  Filled 2014-09-22: qty 3

## 2014-09-22 MED ORDER — ONDANSETRON HCL 4 MG/2ML IJ SOLN
INTRAMUSCULAR | Status: DC | PRN
  Administered 2014-09-22: 4 mg via INTRAVENOUS

## 2014-09-22 MED ORDER — SODIUM CHLORIDE 0.9 % IV SOLN
INTRAVENOUS | Status: DC
  Administered 2014-09-22: 11:00:00 via INTRAVENOUS

## 2014-09-22 NOTE — Op Note (Signed)
[place], [id number]   ENDOSCOPY PROCEDURE REPORT  PATIENT: Brandon Lawrence, Brandon Lawrence  MR#: [id number] BIRTHDATE: [birth year] , 18  yrs. old GENDER: male ENDOSCOPIST: R.  Garfield Cornea, MD FACP FACG REFERRED BY:  [name], M.D. PROCEDURE DATE:  2014-09-26 PROCEDURE:  EGD w/ biopsy INDICATIONS:  Hematemesis; chronic nausea. MEDICATIONS: Versed 4 mg IV and Demerol 100 mg IV in divided doses. Xylocaine gel orally.  Phenergan 25 mg IV. ASA CLASS:      Class III  CONSENT: The risks, benefits, limitations, alternatives and imponderables have been discussed.  The potential for biopsy, esophogeal dilation, etc. have also been reviewed.  Questions have been answered.  All parties agreeable.  Please see the history and physical in the medical record for more information.  DESCRIPTION OF PROCEDURE: After the risks benefits and alternatives of the procedure were thoroughly explained, informed consent was obtained.  The EG-[id number] ([id number]) endoscope was introduced through the mouth and advanced to the    , limited by Without limitations. The instrument was slowly withdrawn as the mucosa was fully examined.    Normal-appearing tubular esophagus.  Some bile stained gastric fluid present.  Otherwise, stomach empty.  Diffuse mottling patchy erythema with some submucosal hemorrhage of the gastric mucosa diffusely.  No ulcer or infiltrating process.  Small hiatal hernia. Patent pylorus.  Normal-appearing first and second portion of the duodenum.  Biopsies of the abnormal gastric mucosa taken for histologic study. Retroflexed views revealed a hiatal hernia.     The scope was then withdrawn from the patient and the procedure completed.  COMPLICATIONS: There were no immediate complications.  ENDOSCOPIC IMPRESSION: Normal esophagus. Hiatal hernia. Abnormal stomach of uncertain clinical significance?"status post gastric biopsy.  I suspect a recent trivial  upper GI bleed.  Patient admits to smoking marijuana every day with associated nausea.  RECOMMENDATIONS:  I have advised patient to stop smoking marijuana as this is highly likely contributing to some of his nausea (cannabinoid hyperemesis syndrome). He is to continue Dexilant 60 mg daily. Follow up on pathology. Further recommendations to follow.  REPEAT EXAM:  eSigned:  R. Garfield Cornea, MD Rosalita Chessman [facility] 26-Sep-2014 1:18 PM    CC:  CPT CODES: ICD CODES:  The ICD and CPT codes recommended by this software are interpretations from the data that the clinical staff has captured with the software.  The verification of the translation of this report to the ICD and CPT codes and modifiers is the sole responsibility of the health care institution and practicing physician where this report was generated.  [employer] will not be held responsible for the validity of the ICD and CPT codes included on this report.  AMA assumes no liability for data contained or not contained herein. CPT is a Designer, television/film set of the [employer].  PATIENT NAME:  Brandon Lawrence, Brandon Lawrence MR#: [id number]

## 2014-09-22 NOTE — Discharge Instructions (Signed)
EGD Discharge instructions Please read the instructions outlined below and refer to this sheet in the next few weeks. These discharge instructions provide you with general information on caring for yourself after you leave the hospital. Your doctor may also give you specific instructions. While your treatment has been planned according to the most current medical practices available, unavoidable complications occasionally occur. If you have any problems or questions after discharge, please call your doctor. ACTIVITY  You may resume your regular activity but move at a slower pace for the next 24 hours.   Take frequent rest periods for the next 24 hours.   Walking will help expel (get rid of) the air and reduce the bloated feeling in your abdomen.   No driving for 24 hours (because of the anesthesia (medicine) used during the test).   You may shower.   Do not sign any important legal documents or operate any machinery for 24 hours (because of the anesthesia used during the test).  NUTRITION  Drink plenty of fluids.   You may resume your normal diet.   Begin with a light meal and progress to your normal diet.   Avoid alcoholic beverages for 24 hours or as instructed by your caregiver.  MEDICATIONS  You may resume your normal medications unless your caregiver tells you otherwise.  WHAT YOU CAN EXPECT TODAY  You may experience abdominal discomfort such as a feeling of fullness or gas pains.  FOLLOW-UP  Your doctor will discuss the results of your test with you.  SEEK IMMEDIATE MEDICAL ATTENTION IF ANY OF THE FOLLOWING OCCUR:  Excessive nausea (feeling sick to your stomach) and/or vomiting.   Severe abdominal pain and distention (swelling).   Trouble swallowing.   Temperature over 101 F (37.8 C).   Rectal bleeding or vomiting of blood.    Stop smoking marijuana  -  it may be contributing to your nausea  Continue Derxilant 60 mg daily  Further recommendations to follow  pending pathology

## 2014-09-22 NOTE — Interval H&P Note (Signed)
History and Physical Interval Note:  09/22/2014 12:49 PM  Brandon Lawrence  has presented today for surgery, with the diagnosis of hematemesis, abd pain, anemia  The various methods of treatment have been discussed with the patient and family. After consideration of risks, benefits and other options for treatment, the patient has consented to  Procedure(s) with comments: ESOPHAGOGASTRODUODENOSCOPY (EGD) (N/A) - 1315 - moved to 12:00 - office told pt to arrive at 11:00 as a surgical intervention .  The patient's history has been reviewed, patient examined, no change in status, stable for surgery.  I have reviewed the patient's chart and labs.  Questions were answered to the patient's satisfaction.     Brandon Lawrence  No change. EGD per plan. Patient denies dysphagia.The risks, benefits, limitations, alternatives and imponderables have been reviewed with the patient. Potential for esophageal dilation, biopsy, etc. have also been reviewed.  Questions have been answered. All parties agreeable.
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
## 2014-09-26 ENCOUNTER — Encounter (HOSPITAL_COMMUNITY): Payer: Self-pay | Admitting: Internal Medicine

## 2014-09-27 ENCOUNTER — Encounter: Payer: Self-pay | Admitting: Internal Medicine

## 2014-09-27 ENCOUNTER — Telehealth: Payer: Self-pay

## 2014-09-27 NOTE — Telephone Encounter (Signed)
Letter mailed to the pt. 

## 2014-09-27 NOTE — Telephone Encounter (Signed)
Per RMR-  Send letter to patient.  Send copy of letter with path to referring provider and PCP.   Offer office visit with extender in 3 months if not already done

## 2014-10-10 NOTE — Telephone Encounter (Signed)
OV MADE °

## 2014-10-21 DIAGNOSIS — F341 Dysthymic disorder: Secondary | ICD-10-CM | POA: Diagnosis not present

## 2014-10-21 DIAGNOSIS — M5136 Other intervertebral disc degeneration, lumbar region: Secondary | ICD-10-CM | POA: Diagnosis not present

## 2014-11-21 DIAGNOSIS — N289 Disorder of kidney and ureter, unspecified: Secondary | ICD-10-CM | POA: Diagnosis not present

## 2014-11-21 DIAGNOSIS — C642 Malignant neoplasm of left kidney, except renal pelvis: Secondary | ICD-10-CM | POA: Diagnosis not present

## 2014-11-21 DIAGNOSIS — Z125 Encounter for screening for malignant neoplasm of prostate: Secondary | ICD-10-CM | POA: Diagnosis not present

## 2014-11-21 DIAGNOSIS — Z905 Acquired absence of kidney: Secondary | ICD-10-CM | POA: Diagnosis not present

## 2014-11-21 DIAGNOSIS — D495 Neoplasm of unspecified behavior of other genitourinary organs: Secondary | ICD-10-CM | POA: Diagnosis not present

## 2014-11-21 DIAGNOSIS — N400 Enlarged prostate without lower urinary tract symptoms: Secondary | ICD-10-CM | POA: Diagnosis not present

## 2014-11-28 DIAGNOSIS — C642 Malignant neoplasm of left kidney, except renal pelvis: Secondary | ICD-10-CM | POA: Diagnosis not present

## 2014-11-28 DIAGNOSIS — N400 Enlarged prostate without lower urinary tract symptoms: Secondary | ICD-10-CM | POA: Diagnosis not present

## 2014-11-28 DIAGNOSIS — R3912 Poor urinary stream: Secondary | ICD-10-CM | POA: Diagnosis not present

## 2014-12-12 DIAGNOSIS — M5136 Other intervertebral disc degeneration, lumbar region: Secondary | ICD-10-CM | POA: Diagnosis not present

## 2014-12-13 ENCOUNTER — Encounter: Payer: Self-pay | Admitting: Nurse Practitioner

## 2014-12-13 ENCOUNTER — Ambulatory Visit: Payer: Medicare Other | Admitting: Nurse Practitioner

## 2014-12-26 ENCOUNTER — Ambulatory Visit (INDEPENDENT_AMBULATORY_CARE_PROVIDER_SITE_OTHER): Payer: Medicare Other | Admitting: Nurse Practitioner

## 2014-12-26 ENCOUNTER — Encounter: Payer: Self-pay | Admitting: Nurse Practitioner

## 2014-12-26 VITALS — BP 138/74 | HR 76 | Temp 98.1°F | Ht 65.0 in | Wt 144.2 lb

## 2014-12-26 DIAGNOSIS — D649 Anemia, unspecified: Secondary | ICD-10-CM

## 2014-12-26 DIAGNOSIS — K920 Hematemesis: Secondary | ICD-10-CM | POA: Diagnosis not present

## 2014-12-26 DIAGNOSIS — R1030 Lower abdominal pain, unspecified: Secondary | ICD-10-CM

## 2014-12-26 DIAGNOSIS — R110 Nausea: Secondary | ICD-10-CM | POA: Diagnosis not present

## 2014-12-26 NOTE — Assessment & Plan Note (Signed)
Patient with abdominal pain which is since essentially resolved on Dexilant. Continue taking medication, return for follow-up in 6 months. Is due for colonoscopy in 2018. We'll recheck CBC today

## 2014-12-26 NOTE — Progress Notes (Signed)
cc'ed to pcp °

## 2014-12-26 NOTE — Assessment & Plan Note (Addendum)
Patient with nausea and episode of hematemesis before last visit. EGD shows likely trivial upper GI bleed with stomach pathology showing reactive gastropathy. No further symptoms. Take Zofran when necessaryas already ordered. Continue to monitor and follow up in 6 months.
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
## 2014-12-26 NOTE — Assessment & Plan Note (Signed)
Patient with very mild anemia compared to his baseline at last visit as per history of present illness. Possibly related to episode of trivial upper GI bleed. Continue Dexilant, recheck CBC today. Follow-up in 6 months.

## 2014-12-26 NOTE — Patient Instructions (Signed)
1. After blood work drawn when you're able to. 2. Continue taking Dexilant 60 mg once a day to help your abdominal pain and nausea. 3. Return for follow-up in 6 months.

## 2015-01-05 DIAGNOSIS — D649 Anemia, unspecified: Secondary | ICD-10-CM | POA: Diagnosis not present

## 2015-01-06 LAB — CBC WITH DIFFERENTIAL/PLATELET
BASOS PCT: 1 % (ref 0–1)
Basophils Absolute: 0.1 10*3/uL (ref 0.0–0.1)
EOS ABS: 0.3 10*3/uL (ref 0.0–0.7)
EOS PCT: 4 % (ref 0–5)
HEMATOCRIT: 37.1 % — AB (ref 39.0–52.0)
Hemoglobin: 12.8 g/dL — ABNORMAL LOW (ref 13.0–17.0)
LYMPHS PCT: 44 % (ref 12–46)
Lymphs Abs: 3.4 10*3/uL (ref 0.7–4.0)
MCH: 31.6 pg (ref 26.0–34.0)
MCHC: 34.5 g/dL (ref 30.0–36.0)
MCV: 91.6 fL (ref 78.0–100.0)
MONO ABS: 0.7 10*3/uL (ref 0.1–1.0)
MPV: 10.1 fL (ref 8.6–12.4)
Monocytes Relative: 9 % (ref 3–12)
Neutro Abs: 3.2 10*3/uL (ref 1.7–7.7)
Neutrophils Relative %: 42 % — ABNORMAL LOW (ref 43–77)
PLATELETS: 224 10*3/uL (ref 150–400)
RBC: 4.05 MIL/uL — AB (ref 4.22–5.81)
RDW: 15.4 % (ref 11.5–15.5)
WBC: 7.7 10*3/uL (ref 4.0–10.5)

## 2015-06-26 ENCOUNTER — Ambulatory Visit (INDEPENDENT_AMBULATORY_CARE_PROVIDER_SITE_OTHER): Payer: Medicare Other | Admitting: Family Medicine

## 2015-06-26 ENCOUNTER — Encounter: Payer: Self-pay | Admitting: Family Medicine

## 2015-06-26 VITALS — BP 166/80 | HR 80 | Temp 98.9°F | Resp 16 | Ht 65.0 in | Wt 140.0 lb

## 2015-06-26 DIAGNOSIS — K5901 Slow transit constipation: Secondary | ICD-10-CM | POA: Diagnosis not present

## 2015-06-26 DIAGNOSIS — Z1159 Encounter for screening for other viral diseases: Secondary | ICD-10-CM | POA: Diagnosis not present

## 2015-06-26 DIAGNOSIS — Z125 Encounter for screening for malignant neoplasm of prostate: Secondary | ICD-10-CM | POA: Diagnosis not present

## 2015-06-26 DIAGNOSIS — K219 Gastro-esophageal reflux disease without esophagitis: Secondary | ICD-10-CM | POA: Diagnosis not present

## 2015-06-26 DIAGNOSIS — M47817 Spondylosis without myelopathy or radiculopathy, lumbosacral region: Secondary | ICD-10-CM

## 2015-06-26 DIAGNOSIS — F1210 Cannabis abuse, uncomplicated: Secondary | ICD-10-CM

## 2015-06-26 DIAGNOSIS — G8929 Other chronic pain: Secondary | ICD-10-CM

## 2015-06-26 DIAGNOSIS — R109 Unspecified abdominal pain: Secondary | ICD-10-CM

## 2015-06-26 DIAGNOSIS — I10 Essential (primary) hypertension: Secondary | ICD-10-CM

## 2015-06-26 DIAGNOSIS — N400 Enlarged prostate without lower urinary tract symptoms: Secondary | ICD-10-CM

## 2015-06-26 DIAGNOSIS — F1290 Cannabis use, unspecified, uncomplicated: Secondary | ICD-10-CM

## 2015-06-26 LAB — COMPREHENSIVE METABOLIC PANEL
ALBUMIN: 3.6 g/dL (ref 3.6–5.1)
ALT: 10 U/L (ref 9–46)
AST: 17 U/L (ref 10–35)
Alkaline Phosphatase: 36 U/L — ABNORMAL LOW (ref 40–115)
BILIRUBIN TOTAL: 0.4 mg/dL (ref 0.2–1.2)
BUN: 14 mg/dL (ref 7–25)
CALCIUM: 8.6 mg/dL (ref 8.6–10.3)
CO2: 25 mmol/L (ref 20–31)
Chloride: 108 mmol/L (ref 98–110)
Creat: 1.22 mg/dL (ref 0.70–1.33)
Glucose, Bld: 103 mg/dL — ABNORMAL HIGH (ref 70–99)
Potassium: 4.3 mmol/L (ref 3.5–5.3)
Sodium: 140 mmol/L (ref 135–146)
Total Protein: 6.4 g/dL (ref 6.1–8.1)

## 2015-06-26 LAB — CBC WITH DIFFERENTIAL/PLATELET
Basophils Absolute: 0 10*3/uL (ref 0.0–0.1)
Basophils Relative: 0 % (ref 0–1)
Eosinophils Absolute: 0.3 10*3/uL (ref 0.0–0.7)
Eosinophils Relative: 4 % (ref 0–5)
HCT: 35.4 % — ABNORMAL LOW (ref 39.0–52.0)
HEMOGLOBIN: 11.9 g/dL — AB (ref 13.0–17.0)
LYMPHS PCT: 37 % (ref 12–46)
Lymphs Abs: 2.6 10*3/uL (ref 0.7–4.0)
MCH: 30.7 pg (ref 26.0–34.0)
MCHC: 33.6 g/dL (ref 30.0–36.0)
MCV: 91.2 fL (ref 78.0–100.0)
MONO ABS: 0.7 10*3/uL (ref 0.1–1.0)
MONOS PCT: 10 % (ref 3–12)
MPV: 9.9 fL (ref 8.6–12.4)
Neutro Abs: 3.4 10*3/uL (ref 1.7–7.7)
Neutrophils Relative %: 49 % (ref 43–77)
Platelets: 202 10*3/uL (ref 150–400)
RBC: 3.88 MIL/uL — ABNORMAL LOW (ref 4.22–5.81)
RDW: 14.5 % (ref 11.5–15.5)
WBC: 7 10*3/uL (ref 4.0–10.5)

## 2015-06-26 LAB — LIPID PANEL
Cholesterol: 130 mg/dL (ref 125–200)
HDL: 35 mg/dL — ABNORMAL LOW (ref >=40)
LDL CALC: 82 mg/dL (ref <130)
Total CHOL/HDL Ratio: 3.7 Ratio (ref <=5.0)
Triglycerides: 64 mg/dL (ref <150)
VLDL: 13 mg/dL (ref <30)

## 2015-06-26 LAB — HEPATITIS C ANTIBODY: HCV Ab: NEGATIVE

## 2015-06-26 MED ORDER — DEXLANSOPRAZOLE 60 MG PO CPDR: 60.0000 mg | DELAYED_RELEASE_CAPSULE | Freq: Every day | ORAL | Status: DC

## 2015-06-26 MED ORDER — CYCLOBENZAPRINE HCL 5 MG PO TABS: 5.0000 mg | ORAL_TABLET | Freq: Three times a day (TID) | ORAL | Status: DC | PRN

## 2015-06-26 MED ORDER — ONDANSETRON HCL 4 MG PO TABS: 4.0000 mg | ORAL_TABLET | Freq: Three times a day (TID) | ORAL | Status: DC | PRN

## 2015-06-26 MED ORDER — DOXAZOSIN MESYLATE ER 4 MG PO TB24: 4.0000 mg | ORAL_TABLET | Freq: Every day | ORAL | Status: DC

## 2015-06-26 MED ORDER — DULOXETINE HCL 30 MG PO CPEP: 30.0000 mg | ORAL_CAPSULE | Freq: Every day | ORAL | Status: DC

## 2015-06-26 NOTE — Assessment & Plan Note (Signed)
Advise to increase Miralax to 1 CAP full twice a day until he sees GI on wed

## 2015-06-26 NOTE — Assessment & Plan Note (Signed)
Restart Dexilant once a day  zofran refilled

## 2015-06-26 NOTE — Assessment & Plan Note (Signed)
Discussed importance of cessation  

## 2015-06-26 NOTE — Assessment & Plan Note (Signed)
Uncontrolled and cause of Headaches, EKG reassuring, restart cardura, recheck BP in 1 week

## 2015-06-26 NOTE — Assessment & Plan Note (Signed)
Check PSA- restart cardura

## 2015-06-26 NOTE — Patient Instructions (Addendum)
Take the blood pressure medicine Cardura in the morning Restart Cymbalta at night Take miralax 1 cap full twice a day -- KEEP APPOINTMENT with Stomach doctor Restart stomach pill Dexilant  F/U1 week for recheck on blood pressure

## 2015-06-26 NOTE — Progress Notes (Signed)
Patient ID: Brandon Lawrence, male   DOB: Oct 23, 1960, 55 y.o.   MRN: [id number]    Subjective:    Patient ID: Brandon Lawrence, male    DOB: [birth year], 55 y.o.   MRN: [id number]  Patient presents for GI Upset/ Constipation and Frequent Headaches  patient here for follow-up. He has not been seen since May 2017. He has history of chronic abdominal pain which is secondary to chronic constipation and acid reflux. He was seen by gastroneurology in the past. He's been off of his medications. He was last on Dexilant  for his acid reflux he was on Miralax regimen for his constipation. He did have colonoscopy done as well as endoscopy which showed hiatal hernia and trivial upper GI bleed. He was also told to stop marijuana as it was causing hyperemesis syndrome he was on antiemetics for this as needed. -- He is out of his reflux medication he is still taking the MiraLAX powder but is not helping to move his bowel states that he has not had a good bowel movement 3 or 4 days  He has remote history of renal cell carcinoma he is still following with urology on a yearly basis but has not had any recurrence  He also is history of enlarged prostate along with mild hypertension. He was on Cardura for this. He's been out of this medication since November He complains of headaches since he has been out of his BP medications. Also had a few episodes where he has some chest discomfort but he did not seek care at the hospital. It was a sharp pain in the middle of his chest he denies nausea vomiting associated no diaphoresis   He was also told that he had herpes virus he was given acyclovir back in September he completed this but wants to be rechecked. He states that he broke out in some small bumps in the genital region  Chronic pain he has chronic abdominal pain as well as arthritis pain he was on Cymbalta in the past he was also started on gabapentin 100 mg 3 times a day which she is out of both of these medications.  His Cymbalta as to state vertigo milligram twice a day     Review Of Systems:  GEN- denies fatigue, fever, weight loss,weakness, recent illness HEENT- denies eye drainage, change in vision, nasal discharge, CVS- denies chest pain, palpitations RESP- denies SOB, cough, wheeze ABD- + N/V, +change in stools, +abd pain GU- denies dysuria, hematuria, dribbling, incontinence MSK- denies joint pain, muscle aches, injury Neuro- +headache, dizziness, syncope, seizure activity       Objective:    BP 166/80 mmHg  Pulse 80  Temp(Src) 98.9 F (37.2 C) (Oral)  Resp 16  Ht 5\' 5"  (1.651 m)  Wt 140 lb (63.504 kg)  BMI 23.30 kg/m2 GEN- NAD, alert and oriented x3 HEENT- PERRL, EOMI, non injected sclera, pink conjunctiva, MMM, oropharynx clear, small pupils, difficult to see fundus Neck- Supple, no thyromegaly CVS- RRR, no murmur RESP-CTAB ABD-NABS,soft,NT,ND EXT- No edema Pulses- Radial - 2+\ NEURO-CNII-XII intact, no deficits   EKG- NSR, no ST changes        Assessment & Plan:      Problem List Items Addressed This Visit    Marijuana use    Discussed importance of cessation      Lumbosacral spondylosis without myelopathy    Restart cymbalta 30mg  once a day       Relevant Medications   cyclobenzaprine (FLEXERIL) 5  MG tablet   GERD (gastroesophageal reflux disease)    Restart Dexilant once a day  zofran refilled      Relevant Medications   ondansetron (ZOFRAN) 4 MG tablet   dexlansoprazole (DEXILANT) 60 MG capsule   Essential hypertension, benign    Uncontrolled and cause of Headaches, EKG reassuring, restart cardura, recheck BP in 1 week      Relevant Orders   Lipid panel   EKG 12-Lead (Completed)   Constipation    Advise to increase Miralax to 1 CAP full twice a day until he sees GI on wed       Chronic abdominal pain - Primary   Relevant Medications   DULoxetine (CYMBALTA) 30 MG capsule   cyclobenzaprine (FLEXERIL) 5 MG tablet   Other Relevant Orders    CBC with Differential/Platelet   Comprehensive metabolic panel   BPH (benign prostatic hypertrophy)    Check PSA- restart cardura      Relevant Medications   doxazosin (CARDURA XL) 4 MG 24 hr tablet    Other Visit Diagnoses    Prostate cancer screening        Relevant Orders    PSA, Medicare    Screening for viral disease        Relevant Orders    HSV(herpes smplx)abs-1+2(IgG+IgM)-bld    HIV antibody (with reflex)    Hepatitis C antibody, reflex       Note: This dictation was prepared with Dragon dictation along with smaller phrase technology. Any transcriptional errors that result from this process are unintentional.

## 2015-06-26 NOTE — Assessment & Plan Note (Signed)
Restart cymbalta 30mg  once a day

## 2015-06-27 LAB — HIV ANTIBODY (ROUTINE TESTING W REFLEX): HIV: NONREACTIVE

## 2015-06-27 LAB — PSA, MEDICARE: PSA: 1.03 ng/mL (ref <=4.00)

## 2015-06-28 ENCOUNTER — Encounter: Payer: Self-pay | Admitting: Nurse Practitioner

## 2015-06-28 ENCOUNTER — Ambulatory Visit (INDEPENDENT_AMBULATORY_CARE_PROVIDER_SITE_OTHER): Payer: Medicare Other | Admitting: Nurse Practitioner

## 2015-06-28 ENCOUNTER — Other Ambulatory Visit: Payer: Self-pay

## 2015-06-28 VITALS — BP 142/71 | HR 66 | Temp 97.6°F | Ht 65.0 in | Wt 144.6 lb

## 2015-06-28 DIAGNOSIS — D649 Anemia, unspecified: Secondary | ICD-10-CM

## 2015-06-28 DIAGNOSIS — R112 Nausea with vomiting, unspecified: Secondary | ICD-10-CM | POA: Diagnosis not present

## 2015-06-28 DIAGNOSIS — Z86010 Personal history of colonic polyps: Secondary | ICD-10-CM

## 2015-06-28 DIAGNOSIS — R1030 Lower abdominal pain, unspecified: Secondary | ICD-10-CM

## 2015-06-28 DIAGNOSIS — R1110 Vomiting, unspecified: Secondary | ICD-10-CM | POA: Insufficient documentation

## 2015-06-28 DIAGNOSIS — Z860101 Personal history of adenomatous and serrated colon polyps: Secondary | ICD-10-CM

## 2015-06-28 LAB — IRON AND TIBC
%SAT: 23 % (ref 15–60)
IRON: 63 ug/dL (ref 50–180)
TIBC: 273 ug/dL (ref 250–425)
UIBC: 210 ug/dL (ref 125–400)

## 2015-06-28 MED ORDER — PEG 3350-KCL-NA BICARB-NACL 420 G PO SOLR: 4000.0000 mL | Freq: Once | ORAL | Status: DC

## 2015-06-28 NOTE — Progress Notes (Signed)
cc'ed to referring doctor °

## 2015-06-28 NOTE — Assessment & Plan Note (Signed)
Patient with a single tubular adenoma polyp in 2013, recommended 5 year repeat colonoscopy which would be next year. Given his current presentation including persistent abdominal pain and intermittent mild anemia we'll proceed with a colonoscopy at this time to further evaluate.
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
## 2015-06-28 NOTE — Patient Instructions (Signed)
1. Continue taking Zofran. 2. Continue taking Dexilant. 3. Have your labs drawn when you're able to. 4. We will schedule your procedure for you. 5. Further recommendations to be based on results of your procedure. 6. Return for follow-up in 3 months.

## 2015-06-28 NOTE — Assessment & Plan Note (Signed)
Patient with continued intermittent lower abdominal pain. Secondary relatives with a history of colon cancer, technically does not increased risk by guidelines. He does have a history of tubular adenoma in 2013 and recommended repeat in 5 years. Given his anemia as discussed below we will proceed with colonoscopy at this time to further evaluate.

## 2015-06-28 NOTE — Assessment & Plan Note (Signed)
Patient with persistent nausea, had an episode of emesis last week. He was temporarily out of his Zofran at that time. Continues to smoke marijuana twice a day. Had a discussion with the patient about cannabinoid hyperemesis syndrome and the fact that continued marijuana use could be contributing to his nausea and vomiting. He verbalized understanding and admitted his PCP had the same discussion with him last week. At this point we'll recommend he continue Zofran, attempt to cut back and see's smoking marijuana. We'll also proceed with colonoscopy as noted below.

## 2015-06-28 NOTE — Assessment & Plan Note (Signed)
Patient with intermittent anemia over the course of the past couple months. Most recently, his CBC drawn 2 days ago shows again a low hemoglobin. No obvious source of bleeding noted by the patient. At this point I will add on iron and ferritin studies to evaluate for iron loss. He is due for a colonoscopy next year but given his persistent abdominal pain, intermittent anemia, and history of tubular adenoma we will move this up early interval colonoscopy. Continue Zofran for nausea.  Proceed with TCS with 25 mg pre-procedure Phenergan with Dr. Gala Romney in near future: the risks, benefits, and alternatives have been discussed with the patient in detail. The patient states understanding and desires to proceed.  The patient is not on any anticoagulants, anxiolytics, chronic pain medications. He is on Cymbalta and smokes marijuana twice a day. He recently had an endoscopy approximately 6 months ago with conscious sedation and 25 mg of Phenergan with no noted complications. We will keep the same sedation protocol as it is likely to be effective again.

## 2015-06-29 LAB — HSV(HERPES SMPLX)ABS-I+II(IGG+IGM)-BLD
HSV 1 Glycoprotein G Ab, IgG: 6.49 IV — ABNORMAL HIGH
HSV 2 Glycoprotein G Ab, IgG: <0.1 IV
Herpes Simplex Vrs I&II-IgM Ab (EIA): 1 INDEX

## 2015-06-29 LAB — FERRITIN: Ferritin: 193 ng/mL (ref 20–380)

## 2015-06-29 NOTE — Progress Notes (Signed)
Quick Note:  Iron and Ferritin are normal. Continue with the plan for colonoscopy and follow-up as per plan at last visit. ______

## 2015-07-03 ENCOUNTER — Encounter: Payer: Self-pay | Admitting: Family Medicine

## 2015-07-03 ENCOUNTER — Ambulatory Visit (INDEPENDENT_AMBULATORY_CARE_PROVIDER_SITE_OTHER): Payer: Medicare Other | Admitting: Family Medicine

## 2015-07-03 VITALS — BP 138/78 | HR 80 | Temp 98.5°F | Resp 18 | Wt 142.0 lb

## 2015-07-03 DIAGNOSIS — I10 Essential (primary) hypertension: Secondary | ICD-10-CM

## 2015-07-03 DIAGNOSIS — B009 Herpesviral infection, unspecified: Secondary | ICD-10-CM | POA: Diagnosis not present

## 2015-07-03 MED ORDER — POLYETHYLENE GLYCOL 3350 17 GM/SCOOP PO POWD: 17.0000 g | Freq: Every day | ORAL | Status: DC

## 2015-07-03 NOTE — Progress Notes (Signed)
Patient ID: Brandon Lawrence, male   DOB: Sep 02, 1960, 55 y.o.   MRN: [id number]   Subjective:    Patient ID: Brandon Lawrence, male    DOB: [birth year], 55 y.o.   MRN: [id number]  Patient presents for Follow-up   patient here for one-week follow-up on his blood pressure. I resolved all his medications including his Cardura which was controlling his blood pressure as well as his prostate symptoms. He has no new concerns today. We'll review his labs in detail he does have HSV type I as well as mild anemia. He was seen by neurology last week as well there were no medication changes but he is a colonoscopy due to his anemia persistent issues with his bowels.  He states he feels better today  Review Of Systems:  GEN- denies fatigue, fever, weight loss,weakness, recent illness HEENT- denies eye drainage, change in vision, nasal discharge, CVS- denies chest pain, palpitations RESP- denies SOB, cough, wheeze ABD- denies N/V, change in stools, abd pain Neuro- denies headache, dizziness, syncope, seizure activity       Objective:    BP 138/78 mmHg  Pulse 80  Temp(Src) 98.5 F (36.9 C) (Oral)  Resp 18  Wt 142 lb (64.411 kg) GEN- NAD, alert and oriented x3 CVS- RRR, no murmur RESP-CTAB        Assessment & Plan:      Problem List Items Addressed This Visit    Herpes simplex type 1 infection    Will use Valtrex as needed for any outbreaks, possible this was transmitted to his genital region.       Essential hypertension, benign - Primary    BP much improved, continue Cardura         Note: This dictation was prepared with Dragon dictation along with smaller phrase technology. Any transcriptional errors that result from this process are unintentional.

## 2015-07-03 NOTE — Assessment & Plan Note (Signed)
BP much improved, continue Cardura

## 2015-07-03 NOTE — Patient Instructions (Signed)
Continue current medication F/U 4 months

## 2015-07-03 NOTE — Assessment & Plan Note (Signed)
Will use Valtrex as needed for any outbreaks, possible this was transmitted to his genital region.

## 2015-07-25 ENCOUNTER — Telehealth: Payer: Self-pay

## 2015-07-25 NOTE — Telephone Encounter (Signed)
Called and spoke with pt. Moved procedure time up to 1215 pm. Pt agreed and is aware of clear liquid cut off time

## 2015-07-26 ENCOUNTER — Telehealth: Payer: Self-pay

## 2015-07-26 ENCOUNTER — Encounter (HOSPITAL_COMMUNITY)
Admission: RE | Disposition: A | Discharge status: Home / Self Care | Payer: Self-pay | Source: Ambulatory Visit | Attending: Internal Medicine

## 2015-07-26 ENCOUNTER — Encounter (HOSPITAL_COMMUNITY): Payer: Self-pay | Admitting: *Deleted

## 2015-07-26 ENCOUNTER — Ambulatory Visit (HOSPITAL_COMMUNITY)
Admission: RE | Admit: 2015-07-26 | Discharge: 2015-07-26 | Disposition: A | Discharge status: Home / Self Care | Payer: Medicare Other | Source: Ambulatory Visit | Attending: Internal Medicine | Admitting: Internal Medicine

## 2015-07-26 DIAGNOSIS — D649 Anemia, unspecified: Secondary | ICD-10-CM

## 2015-07-26 DIAGNOSIS — Z905 Acquired absence of kidney: Secondary | ICD-10-CM | POA: Insufficient documentation

## 2015-07-26 DIAGNOSIS — Z85528 Personal history of other malignant neoplasm of kidney: Secondary | ICD-10-CM | POA: Insufficient documentation

## 2015-07-26 DIAGNOSIS — Z86010 Personal history of colonic polyps: Secondary | ICD-10-CM

## 2015-07-26 DIAGNOSIS — Z539 Procedure and treatment not carried out, unspecified reason: Secondary | ICD-10-CM | POA: Diagnosis not present

## 2015-07-26 DIAGNOSIS — R110 Nausea: Secondary | ICD-10-CM | POA: Diagnosis not present

## 2015-07-26 DIAGNOSIS — R112 Nausea with vomiting, unspecified: Secondary | ICD-10-CM

## 2015-07-26 DIAGNOSIS — R109 Unspecified abdominal pain: Secondary | ICD-10-CM | POA: Insufficient documentation

## 2015-07-26 SURGERY — COLONOSCOPY
Anesthesia: Moderate Sedation

## 2015-07-26 MED ORDER — PROMETHAZINE HCL 25 MG/ML IJ SOLN: 25.0000 mg | Freq: Once | INTRAMUSCULAR | Status: DC

## 2015-07-26 MED ORDER — SODIUM CHLORIDE 0.9 % IV SOLN: INTRAVENOUS | Status: DC

## 2015-07-26 NOTE — Telephone Encounter (Signed)
Melanie from [employer] called and states that patient did not complete his prep as instructed and that he ate solid food yesterday.  RMR has cancelled his procedure.  Called patient home and spoke with patients mother. She said she would have pt call us to reschedule and set up OV.

## 2015-07-26 NOTE — OR Nursing (Signed)
Patient stated that his bowel movements are liquid but still dark brown.  He drank approximately 3/4 of his prep and ate a can of mac and cheese and 2 bites of fish yesterday.  Cancel and reschedule procedure per  Dr. Gala Romney.

## 2015-08-10 ENCOUNTER — Ambulatory Visit (INDEPENDENT_AMBULATORY_CARE_PROVIDER_SITE_OTHER): Payer: Medicare Other | Admitting: Nurse Practitioner

## 2015-08-10 ENCOUNTER — Other Ambulatory Visit: Payer: Self-pay

## 2015-08-10 ENCOUNTER — Encounter: Payer: Self-pay | Admitting: Nurse Practitioner

## 2015-08-10 VITALS — BP 126/71 | HR 68 | Temp 97.2°F | Ht 66.0 in | Wt 144.4 lb

## 2015-08-10 DIAGNOSIS — Z86010 Personal history of colonic polyps: Secondary | ICD-10-CM | POA: Diagnosis not present

## 2015-08-10 DIAGNOSIS — G8929 Other chronic pain: Secondary | ICD-10-CM

## 2015-08-10 DIAGNOSIS — R112 Nausea with vomiting, unspecified: Secondary | ICD-10-CM | POA: Diagnosis not present

## 2015-08-10 DIAGNOSIS — R109 Unspecified abdominal pain: Secondary | ICD-10-CM

## 2015-08-10 DIAGNOSIS — K219 Gastro-esophageal reflux disease without esophagitis: Secondary | ICD-10-CM

## 2015-08-10 DIAGNOSIS — D649 Anemia, unspecified: Secondary | ICD-10-CM

## 2015-08-10 MED ORDER — ONDANSETRON HCL 4 MG PO TABS: 4.0000 mg | ORAL_TABLET | Freq: Three times a day (TID) | ORAL | Status: DC | PRN

## 2015-08-10 MED ORDER — PEG 3350-KCL-NA BICARB-NACL 420 G PO SOLR: 4000.0000 mL | Freq: Once | ORAL | Status: DC
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
## 2015-08-10 NOTE — Assessment & Plan Note (Signed)
Symptoms well controlled on Dexilant. Possible esophagitis as an etiology for anemia although he just had an endoscopy one year ago which showed reactive gastropathy on pathology.

## 2015-08-10 NOTE — Assessment & Plan Note (Addendum)
Persistent nausea and vomiting. Is on Zofran, I sent in a refill. Continues to smoke marijuana twice a day despite extensive counseling to stop given possibility of cannabinoid hyperemesis syndrome. Nausea and vomiting contributed to inability to take his bowel prep. Says he thinks he might is seen blood in his vomit. We'll leave on the possibility of an endoscopy at the time of procedure if it is deemed necessary.

## 2015-08-10 NOTE — Assessment & Plan Note (Addendum)
Issue with intermittent, episodic anemia. History of tubular adenoma 2013, due next year for colonoscopy. Endoscopy last year with abnormal gastric mucosa status post biopsy which showed reactive gastropathy negative for H. pylori. States he may sometimes see blood in his emesis. Also with persistent nausea and vomiting as noted above, very possible etiology of cannabinoid hyperemesis syndrome. Last Hgb 11.9. Recent scheduled colonoscopy was canceled due to not taking prep because of nausea. I refilled Zofran. Given the entirety of his symptoms we will reschedule his colonoscopy and also add the possibility of an upper endoscopy if deemed necessary. Recheck CBC.   Proceed with TCS +/- EGD with 25 mg preprocedure Phenergan with Dr. Gala Romney in near future: the risks, benefits, and alternatives have been discussed with the patient in detail. The patient states understanding and desires to proceed.  The patient is not on any anticoagulants, anxiolytics, chronic pain medications. Does take Cymbalta once a day and smokes marijuana twice a day. Last procedure completed on conscious sedation with 25 mg preprocedure Phenergan which was adequate for his sedation. We will repeat this as it is likely be adequate for his current procedure.

## 2015-08-10 NOTE — Progress Notes (Signed)
cc'ed to pcp °

## 2015-08-10 NOTE — Patient Instructions (Signed)
1. Keep taking Dexilant. 2. I sent in a refill of Zofran to your pharmacy for nausea. 3. We will schedule your procedures for you. 4. Further recommendations to be based on results of your procedures. 5. Return for follow-up in 3 months.  It is very important that you take your bowel prep as instructed.

## 2015-08-10 NOTE — Assessment & Plan Note (Signed)
Abdominal pain improved at this time but intermittently persistent. Continue to monitor, return for follow-up in 3 months.

## 2015-08-10 NOTE — Assessment & Plan Note (Signed)
History of tubular adenoma on colonoscopy in 2013 with a five-year recommended repeat. This is 1 year early however, given his symptoms, we will proceed with colonoscopy and possible endoscopy as noted below.

## 2015-08-21 DIAGNOSIS — Z86010 Personal history of colonic polyps: Secondary | ICD-10-CM | POA: Diagnosis not present

## 2015-08-21 DIAGNOSIS — R112 Nausea with vomiting, unspecified: Secondary | ICD-10-CM | POA: Diagnosis not present

## 2015-08-21 DIAGNOSIS — K219 Gastro-esophageal reflux disease without esophagitis: Secondary | ICD-10-CM | POA: Diagnosis not present

## 2015-08-21 DIAGNOSIS — G8929 Other chronic pain: Secondary | ICD-10-CM | POA: Diagnosis not present

## 2015-08-21 DIAGNOSIS — R109 Unspecified abdominal pain: Secondary | ICD-10-CM | POA: Diagnosis not present

## 2015-08-21 LAB — CBC
HEMATOCRIT: 34.8 % — AB (ref 38.5–50.0)
Hemoglobin: 11.6 g/dL — ABNORMAL LOW (ref 13.2–17.1)
MCH: 31.4 pg (ref 27.0–33.0)
MCHC: 33.3 g/dL (ref 32.0–36.0)
MCV: 94.1 fL (ref 80.0–100.0)
MPV: 9.8 fL (ref 7.5–12.5)
Platelets: 199 10*3/uL (ref 140–400)
RBC: 3.7 MIL/uL — ABNORMAL LOW (ref 4.20–5.80)
RDW: 14.3 % (ref 11.0–15.0)
WBC: 6.6 10*3/uL (ref 3.8–10.8)

## 2015-08-30 ENCOUNTER — Encounter (HOSPITAL_COMMUNITY)
Admission: RE | Disposition: A | Discharge status: Home / Self Care | Payer: Self-pay | Source: Ambulatory Visit | Attending: Internal Medicine

## 2015-08-30 ENCOUNTER — Encounter (HOSPITAL_COMMUNITY): Payer: Self-pay | Admitting: *Deleted

## 2015-08-30 ENCOUNTER — Ambulatory Visit (HOSPITAL_COMMUNITY)
Admission: RE | Admit: 2015-08-30 | Discharge: 2015-08-30 | Disposition: A | Discharge status: Home / Self Care | Payer: Medicare Other | Source: Ambulatory Visit | Attending: Internal Medicine | Admitting: Internal Medicine

## 2015-08-30 DIAGNOSIS — M1991 Primary osteoarthritis, unspecified site: Secondary | ICD-10-CM | POA: Insufficient documentation

## 2015-08-30 DIAGNOSIS — K5730 Diverticulosis of large intestine without perforation or abscess without bleeding: Secondary | ICD-10-CM | POA: Insufficient documentation

## 2015-08-30 DIAGNOSIS — D122 Benign neoplasm of ascending colon: Secondary | ICD-10-CM | POA: Insufficient documentation

## 2015-08-30 DIAGNOSIS — Z1211 Encounter for screening for malignant neoplasm of colon: Secondary | ICD-10-CM | POA: Insufficient documentation

## 2015-08-30 DIAGNOSIS — Z87891 Personal history of nicotine dependence: Secondary | ICD-10-CM | POA: Insufficient documentation

## 2015-08-30 DIAGNOSIS — G4733 Obstructive sleep apnea (adult) (pediatric): Secondary | ICD-10-CM | POA: Insufficient documentation

## 2015-08-30 DIAGNOSIS — K219 Gastro-esophageal reflux disease without esophagitis: Secondary | ICD-10-CM

## 2015-08-30 DIAGNOSIS — K449 Diaphragmatic hernia without obstruction or gangrene: Secondary | ICD-10-CM | POA: Diagnosis not present

## 2015-08-30 DIAGNOSIS — F1210 Cannabis abuse, uncomplicated: Secondary | ICD-10-CM | POA: Insufficient documentation

## 2015-08-30 DIAGNOSIS — Z85528 Personal history of other malignant neoplasm of kidney: Secondary | ICD-10-CM | POA: Diagnosis not present

## 2015-08-30 DIAGNOSIS — Z905 Acquired absence of kidney: Secondary | ICD-10-CM | POA: Diagnosis not present

## 2015-08-30 DIAGNOSIS — Z79899 Other long term (current) drug therapy: Secondary | ICD-10-CM | POA: Diagnosis not present

## 2015-08-30 DIAGNOSIS — Z86010 Personal history of colonic polyps: Secondary | ICD-10-CM | POA: Insufficient documentation

## 2015-08-30 DIAGNOSIS — R112 Nausea with vomiting, unspecified: Secondary | ICD-10-CM | POA: Insufficient documentation

## 2015-08-30 DIAGNOSIS — R109 Unspecified abdominal pain: Secondary | ICD-10-CM

## 2015-08-30 DIAGNOSIS — F329 Major depressive disorder, single episode, unspecified: Secondary | ICD-10-CM | POA: Diagnosis not present

## 2015-08-30 DIAGNOSIS — R1013 Epigastric pain: Secondary | ICD-10-CM | POA: Diagnosis not present

## 2015-08-30 HISTORY — PX: ESOPHAGOGASTRODUODENOSCOPY: SHX5428

## 2015-08-30 HISTORY — PX: COLONOSCOPY: SHX5424

## 2015-08-30 SURGERY — COLONOSCOPY
Anesthesia: Moderate Sedation

## 2015-08-30 MED ORDER — LIDOCAINE VISCOUS 2 % MT SOLN
OROMUCOSAL | Status: AC
  Filled 2015-08-30: qty 15

## 2015-08-30 MED ORDER — MIDAZOLAM HCL 5 MG/5ML IJ SOLN
INTRAMUSCULAR | Status: AC
  Filled 2015-08-30: qty 10

## 2015-08-30 MED ORDER — PROMETHAZINE HCL 25 MG/ML IJ SOLN
INTRAMUSCULAR | Status: AC
  Filled 2015-08-30: qty 1

## 2015-08-30 MED ORDER — LIDOCAINE VISCOUS 2 % MT SOLN
OROMUCOSAL | Status: DC | PRN
  Administered 2015-08-30: 1 via OROMUCOSAL

## 2015-08-30 MED ORDER — MEPERIDINE HCL 100 MG/ML IJ SOLN
INTRAMUSCULAR | Status: AC
  Filled 2015-08-30: qty 2

## 2015-08-30 MED ORDER — ONDANSETRON HCL 4 MG/2ML IJ SOLN
INTRAMUSCULAR | Status: DC | PRN
  Administered 2015-08-30: 4 mg via INTRAVENOUS

## 2015-08-30 MED ORDER — PROMETHAZINE HCL 25 MG/ML IJ SOLN
25.0000 mg | Freq: Once | INTRAMUSCULAR | Status: AC
  Administered 2015-08-30: 25 mg via INTRAVENOUS

## 2015-08-30 MED ORDER — SODIUM CHLORIDE 0.9 % IV SOLN
INTRAVENOUS | Status: DC
Start: 2015-08-30 — End: 2015-08-30
  Administered 2015-08-30: 09:00:00 via INTRAVENOUS

## 2015-08-30 MED ORDER — ONDANSETRON HCL 4 MG/2ML IJ SOLN
INTRAMUSCULAR | Status: AC
  Filled 2015-08-30: qty 2

## 2015-08-30 MED ORDER — SODIUM CHLORIDE 0.9% FLUSH
INTRAVENOUS | Status: AC
  Filled 2015-08-30: qty 10

## 2015-08-30 MED ORDER — MIDAZOLAM HCL 5 MG/5ML IJ SOLN
INTRAMUSCULAR | Status: DC | PRN
  Administered 2015-08-30: 2 mg via INTRAVENOUS
  Administered 2015-08-30 (×2): 1 mg via INTRAVENOUS
  Administered 2015-08-30: 2 mg via INTRAVENOUS
  Administered 2015-08-30: 1 mg via INTRAVENOUS

## 2015-08-30 MED ORDER — MEPERIDINE HCL 100 MG/ML IJ SOLN
INTRAMUSCULAR | Status: DC | PRN
  Administered 2015-08-30: 50 mg via INTRAVENOUS
  Administered 2015-08-30 (×4): 25 mg via INTRAVENOUS
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
## 2015-08-30 NOTE — Discharge Instructions (Signed)
Colonoscopy Discharge Instructions  Read the instructions outlined below and refer to this sheet in the next few weeks. These discharge instructions provide you with general information on caring for yourself after you leave the hospital. Your doctor may also give you specific instructions. While your treatment has been planned according to the most current medical practices available, unavoidable complications occasionally occur. If you have any problems or questions after discharge, call Dr. Gala Lawrence at [phone]. ACTIVITY  You may resume your regular activity, but move at a slower pace for the next 24 hours.   Take frequent rest periods for the next 24 hours.   Walking will help get rid of the air and reduce the bloated feeling in your belly (abdomen).   No driving for 24 hours (because of the medicine (anesthesia) used during the test).    Do not sign any important legal documents or operate any machinery for 24 hours (because of the anesthesia used during the test).  NUTRITION  Drink plenty of fluids.   You may resume your normal diet as instructed by your doctor.   Begin with a light meal and progress to your normal diet. Heavy or fried foods are harder to digest and may make you feel sick to your stomach (nauseated).   Avoid alcoholic beverages for 24 hours or as instructed.  MEDICATIONS  You may resume your normal medications unless your doctor tells you otherwise.  WHAT YOU CAN EXPECT TODAY  Some feelings of bloating in the abdomen.   Passage of more gas than usual.   Spotting of blood in your stool or on the toilet paper.  IF YOU HAD POLYPS REMOVED DURING THE COLONOSCOPY:  No aspirin products for 7 days or as instructed.   No alcohol for 7 days or as instructed.   Eat a soft diet for the next 24 hours.  FINDING OUT THE RESULTS OF YOUR TEST Not all test results are available during your visit. If your test results are not back during the visit, make an appointment  with your caregiver to find out the results. Do not assume everything is normal if you have not heard from your caregiver or the medical facility. It is important for you to follow up on all of your test results.  SEEK IMMEDIATE MEDICAL ATTENTION IF:  You have more than a spotting of blood in your stool.   Your belly is swollen (abdominal distention).   You are nauseated or vomiting.   You have a temperature over 101.   You have abdominal pain or discomfort that is severe or gets worse throughout the day.  EGD Discharge instructions Please read the instructions outlined below and refer to this sheet in the next few weeks. These discharge instructions provide you with general information on caring for yourself after you leave the hospital. Your doctor may also give you specific instructions. While your treatment has been planned according to the most current medical practices available, unavoidable complications occasionally occur. If you have any problems or questions after discharge, please call your doctor. ACTIVITY You may resume your regular activity but move at a slower pace for the next 24 hours.  Take frequent rest periods for the next 24 hours.  Walking will help expel (get rid of) the air and reduce the bloated feeling in your abdomen.  No driving for 24 hours (because of the anesthesia (medicine) used during the test).  You may shower.  Do not sign any important legal documents or operate any machinery  for 24 hours (because of the anesthesia used during the test).  NUTRITION Drink plenty of fluids.  You may resume your normal diet.  Begin with a light meal and progress to your normal diet.  Avoid alcoholic beverages for 24 hours or as instructed by your caregiver.  MEDICATIONS You may resume your normal medications unless your caregiver tells you otherwise.  WHAT YOU CAN EXPECT TODAY You may experience abdominal discomfort such as a feeling of fullness or gas pains.    FOLLOW-UP Your doctor will discuss the results of your test with you.  SEEK IMMEDIATE MEDICAL ATTENTION IF ANY OF THE FOLLOWING OCCUR: Excessive nausea (feeling sick to your stomach) and/or vomiting.  Severe abdominal pain and distention (swelling).  Trouble swallowing.  Temperature over 101 F (37.8 C).  Rectal bleeding or vomiting of blood.    Colon polyp information provided  Further recommendations to follow pending review of pathology report  I strongly recommend you stop smoking marijuana entirely     Colon Polyps Polyps are lumps of extra tissue growing inside the body. Polyps can grow in the large intestine (colon). Most colon polyps are noncancerous (benign). However, some colon polyps can become cancerous over time. Polyps that are larger than a pea may be harmful. To be safe, caregivers remove and test all polyps. CAUSES  Polyps form when mutations in the genes cause your cells to grow and divide even though no more tissue is needed. RISK FACTORS There are a number of risk factors that can increase your chances of getting colon polyps. They include:  Being older than 50 years.  Family history of colon polyps or colon cancer.  Long-term colon diseases, such as colitis or Crohn disease.  Being overweight.  Smoking.  Being inactive.  Drinking too much alcohol. SYMPTOMS  Most small polyps do not cause symptoms. If symptoms are present, they may include:  Blood in the stool. The stool may look dark red or black.  Constipation or diarrhea that lasts longer than 1 week. DIAGNOSIS People often do not know they have polyps until their caregiver finds them during a regular checkup. Your caregiver can use 4 tests to check for polyps:  Digital rectal exam. The caregiver wears gloves and feels inside the rectum. This test would find polyps only in the rectum.  Barium enema. The caregiver puts a liquid called barium into your rectum before taking X-rays of your  colon. Barium makes your colon look white. Polyps are dark, so they are easy to see in the X-ray pictures.  Sigmoidoscopy. A thin, flexible tube (sigmoidoscope) is placed into your rectum. The sigmoidoscope has a light and tiny camera in it. The caregiver uses the sigmoidoscope to look at the last third of your colon.  Colonoscopy. This test is like sigmoidoscopy, but the caregiver looks at the entire colon. This is the most common method for finding and removing polyps. TREATMENT  Any polyps will be removed during a sigmoidoscopy or colonoscopy. The polyps are then tested for cancer. PREVENTION  To help lower your risk of getting more colon polyps:  Eat plenty of fruits and vegetables. Avoid eating fatty foods.  Do not smoke.  Avoid drinking alcohol.  Exercise every day.  Lose weight if recommended by your caregiver.  Eat plenty of calcium and folate. Foods that are rich in calcium include milk, cheese, and broccoli. Foods that are rich in folate include chickpeas, kidney beans, and spinach. HOME CARE INSTRUCTIONS Keep all follow-up appointments as directed by your  caregiver. You may need periodic exams to check for polyps. SEEK MEDICAL CARE IF: You notice bleeding during a bowel movement.   This information is not intended to replace advice given to you by your health care provider. Make sure you discuss any questions you have with your health care provider.   Document Released: 01/17/2004 Document Revised: 05/13/2014 Document Reviewed: 07/02/2011 Elsevier Interactive Patient Education [employer].

## 2015-08-30 NOTE — Interval H&P Note (Signed)
History and Physical Interval Note:  08/30/2015 9:23 AM  Brandon Lawrence  has presented today for surgery, with the diagnosis of nausea, vomiting, GERD, abdominal pain, history of colon polyps  The various methods of treatment have been discussed with the patient and family. After consideration of risks, benefits and other options for treatment, the patient has consented to  Procedure(s) with comments: COLONOSCOPY (N/A) - 0930 ESOPHAGOGASTRODUODENOSCOPY (EGD) (N/A) as a surgical intervention .  The patient's history has been reviewed, patient examined, no change in status, stable for surgery.  I have reviewed the patient's chart and labs.  Questions were answered to the patient's satisfaction.     Brandon Lawrence  No change. Colonoscopy with possible EGD to follow per plan. The risks, benefits, limitations, imponderables and alternatives regarding both EGD and colonoscopy have been reviewed with the patient. Questions have been answered. All parties agreeable.

## 2015-08-30 NOTE — Op Note (Signed)
[facility] Patient Name: Brandon Lawrence Procedure Date: 08/30/2015 10:03 AM MRN: [id number] Date of Birth: 22-Mar-1961 Attending MD: Norvel Richards , MD CSN: [id number] Age: 55 Admit Type: Outpatient Procedure:                Upper GI endoscopy Indications:              Epigastric abdominal pain Providers:                Norvel Richards, MD, Janeece Riggers, RN, Isabella Stalling, Technician Referring MD:             Modena Nunnery. [place] Medicines:                Midazolam 8 mg IV, Meperidine 150 mg IV,                            Ondansetron 4 mg IV Complications:            No immediate complications. Estimated Blood Loss:     Estimated blood loss: none. Procedure:                Pre-Anesthesia Assessment:                           - Prior to the procedure, a History and Physical                            was performed, and patient medications and                            allergies were reviewed. The patient's tolerance of                            previous anesthesia was also reviewed. The risks                            and benefits of the procedure and the sedation                            options and risks were discussed with the patient.                            All questions were answered, and informed consent                            was obtained. Prior Anticoagulants: The patient has                            taken no previous anticoagulant or antiplatelet                            agents. ASA Grade Assessment: II - A patient with  mild systemic disease. After reviewing the risks                            and benefits, the patient was deemed in                            satisfactory condition to undergo the procedure.                           After obtaining informed consent, the endoscope was                            passed under direct vision. Throughout the                            procedure,  the patient's blood pressure, pulse, and                            oxygen saturations were monitored continuously. The                            [id number] scope was introduced through the                            mouth, and advanced to the second part of duodenum.                            The upper GI endoscopy was accomplished without                            difficulty. The patient tolerated the procedure                            well. Scope In: 10:06:44 AM Scope Out: 10:12:12 AM Total Procedure Duration: 0 hours 5 minutes 28 seconds  Findings:      The examined esophagus was normal.      A small hiatal hernia was present.      The exam was otherwise without abnormality.      The cardia and gastric fundus were normal on retroflexion.      The second portion of the duodenum was normal. Estimated blood loss:       none. Impression:               - Normal esophagus.                           - Small hiatal hernia.                           - The examination was otherwise normal.                           - Normal second portion of the duodenum.                           -  No specimens collected. Moderate Sedation:      Moderate (conscious) sedation was administered by the endoscopy nurse       and supervised by the endoscopist. The following parameters were       monitored: oxygen saturation, heart rate, blood pressure, respiratory       rate, EKG, adequacy of pulmonary ventilation, and response to care.       Total physician intraservice time was 48 minutes. Recommendation:           - Patient has a contact number available for                            emergencies. The signs and symptoms of potential                            delayed complications were discussed with the                            patient. Return to normal activities tomorrow.                            Written discharge instructions were provided to the                            patient.                            - Resume previous diet.                           - Continue present medications.                           - No repeat upper endoscopy.                           - Return to GI office at appointment to be                            scheduled. See colonoscopy report. Procedure Code(s):        --- Professional ---                           [phone], Esophagogastroduodenoscopy, flexible,                            transoral; diagnostic, including collection of                            specimen(s) by brushing or washing, when performed                            (separate procedure)                           99152, Moderate sedation services provided by the  same physician or other qualified health care                            professional performing the diagnostic or                            therapeutic service that the sedation supports,                            requiring the presence of an independent trained                            observer to assist in the monitoring of the                            patient's level of consciousness and physiological                            status; initial 15 minutes of intraservice time,                            patient age 64 years or older                           [phone], Moderate sedation services; each additional                            15 minutes intraservice time                           99153, Moderate sedation services; each additional                            15 minutes intraservice time Diagnosis Code(s):        --- Professional ---                           K44.9, Diaphragmatic hernia without obstruction or                            gangrene                           R10.13, Epigastric pain CPT copyright 2016 [employer]. All rights reserved. The codes documented in this report are preliminary and upon coder review may  be revised to meet current compliance  requirements. Cristopher Estimable. Ammarie Matsuura, MD Norvel Richards, MD 08/30/2015 10:21:17 AM This report has been signed electronically. Number of Addenda: 0

## 2015-08-30 NOTE — Op Note (Signed)
[facility] Patient Name: Brandon Lawrence Procedure Date: 08/30/2015 9:21 AM MRN: [id number] Date of Birth: 09-04-1960 Attending MD: Norvel Richards , MD CSN: [id number] Age: 55 Admit Type: Outpatient Procedure:                Colonoscopy Indications:              High risk colon cancer surveillance: Personal                            history of colonic polyps Providers:                Norvel Richards, MD, Janeece Riggers, RN, Isabella Stalling, Technician Referring MD:              Medicines:                Midazolam 8 mg IV, Meperidine 150 mg IV,                            Promethazine 25 mg IV Complications:            No immediate complications. Estimated Blood Loss:     Estimated blood loss was minimal. Procedure:                Pre-Anesthesia Assessment:                           - Prior to the procedure, a History and Physical                            was performed, and patient medications and                            allergies were reviewed. The patient's tolerance of                            previous anesthesia was also reviewed. The risks                            and benefits of the procedure and the sedation                            options and risks were discussed with the patient.                            All questions were answered, and informed consent                            was obtained. Prior Anticoagulants: The patient has                            taken no previous anticoagulant or antiplatelet  agents. ASA Grade Assessment: II - A patient with                            mild systemic disease. After reviewing the risks                            and benefits, the patient was deemed in                            satisfactory condition to undergo the procedure.                           After obtaining informed consent, the colonoscope                            was passed under direct  vision. Throughout the                            procedure, the patient's blood pressure, pulse, and                            oxygen saturations were monitored continuously. The                            [id number] scope was introduced through                            the anus and advanced to the the cecum, identified                            by appendiceal orifice and ileocecal valve. The                            colonoscopy was somewhat difficult. [Completeness].                            [Sites Not Well Seen]. The ileocecal valve,                            appendiceal orifice, and rectum were photographed.                            The quality of the bowel preparation was adequate. Scope In: 9:36:55 AM Scope Out: 10:02:15 AM Scope Withdrawal Time: 0 hours 9 minutes 16 seconds  Total Procedure Duration: 0 hours 25 minutes 20 seconds  Findings:      The perianal and digital rectal examinations were normal.      Multiple medium-mouthed diverticula were found in the sigmoid colon.      Two semi-pedunculated polyps were found in the ascending colon. The       polyps were 5 mm in size. These polyps were removed with a cold snare.       Resection and retrieval were complete. Estimated blood loss: none.      The exam was otherwise without abnormality on direct  and retroflexion       views. Impression:               - Mild diverticulosis in the sigmoid colon.                           - Two 5 mm polyps in the ascending colon, removed                            with a cold snare. Resected and retrieved.                           - The examination was otherwise normal on direct                            and retroflexion views. Moderate Sedation:      Moderate (conscious) sedation was administered by the endoscopy nurse       and supervised by the endoscopist. The following parameters were       monitored: oxygen saturation, heart rate, blood pressure, respiratory        rate, EKG, adequacy of pulmonary ventilation, and response to care.       Total physician intraservice time was 48 minutes. Recommendation:           - Patient has a contact number available for                            emergencies. The signs and symptoms of potential                            delayed complications were discussed with the                            patient. Return to normal activities tomorrow.                            Written discharge instructions were provided to the                            patient.                           - Resume previous diet today.                           - Continue present medications.                           - Await pathology results.                           - Repeat colonoscopy date to be determined after                            pending pathology results are reviewed for  surveillance based on pathology results.                           - Return to GI office after studies are complete. Procedure Code(s):        --- Professional ---                           [id number], Colonoscopy, flexible; with removal of                            tumor(s), polyp(s), or other lesion(s) by snare                            technique                           99152, Moderate sedation services provided by the                            same physician or other qualified health care                            professional performing the diagnostic or                            therapeutic service that the sedation supports,                            requiring the presence of an independent trained                            observer to assist in the monitoring of the                            patient's level of consciousness and physiological                            status; initial 15 minutes of intraservice time,                            patient age 81 years or older                           [phone], Moderate sedation  services; each additional                            15 minutes intraservice time                           99153, Moderate sedation services; each additional                            15 minutes intraservice time Diagnosis Code(s):        --- Professional ---  Z86.010, Personal history of colonic polyps                           D12.2, Benign neoplasm of ascending colon                           K57.30, Diverticulosis of large intestine without                            perforation or abscess without bleeding CPT copyright 2016 American [employer] Association. All rights reserved. The codes documented in this report are preliminary and upon coder review may  be revised to meet current compliance requirements. Cristopher Estimable. Fiza Nation, MD Norvel Richards, MD 08/30/2015 3:23:29 PM This report has been signed electronically. Number of Addenda: 0

## 2015-08-31 ENCOUNTER — Encounter: Payer: Self-pay | Admitting: Internal Medicine

## 2015-09-03 ENCOUNTER — Other Ambulatory Visit: Payer: Self-pay | Admitting: Family Medicine

## 2015-09-04 ENCOUNTER — Encounter (HOSPITAL_COMMUNITY): Payer: Self-pay | Admitting: Internal Medicine

## 2015-09-04 NOTE — Telephone Encounter (Signed)
Refill appropriate and filled per protocol. 

## 2015-09-25 ENCOUNTER — Ambulatory Visit: Payer: Medicare Other | Admitting: Nurse Practitioner

## 2015-09-27 ENCOUNTER — Telehealth: Payer: Self-pay | Admitting: Family Medicine

## 2015-09-27 NOTE — Telephone Encounter (Signed)
Call placed to patient. LMTRC.  

## 2015-09-27 NOTE — Telephone Encounter (Signed)
Patient would like to speak to you regarding his hernia and endoscopy  [phone]

## 2015-09-28 NOTE — Telephone Encounter (Signed)
Call placed to patient. LMTRC.  

## 2015-09-29 NOTE — Telephone Encounter (Signed)
Call placed to patient. LMTRC.  

## 2015-10-03 NOTE — Telephone Encounter (Signed)
Multiple calls placed to patient with no answer and no return call.   Message to be closed.  

## 2015-11-03 ENCOUNTER — Ambulatory Visit (INDEPENDENT_AMBULATORY_CARE_PROVIDER_SITE_OTHER): Payer: Medicare Other | Admitting: Family Medicine

## 2015-11-03 ENCOUNTER — Encounter: Payer: Self-pay | Admitting: Family Medicine

## 2015-11-03 VITALS — BP 124/62 | HR 72 | Temp 98.9°F | Resp 12 | Ht 66.0 in | Wt 134.0 lb

## 2015-11-03 DIAGNOSIS — S46211A Strain of muscle, fascia and tendon of other parts of biceps, right arm, initial encounter: Secondary | ICD-10-CM

## 2015-11-03 DIAGNOSIS — R300 Dysuria: Secondary | ICD-10-CM

## 2015-11-03 DIAGNOSIS — K219 Gastro-esophageal reflux disease without esophagitis: Secondary | ICD-10-CM | POA: Diagnosis not present

## 2015-11-03 DIAGNOSIS — S46111A Strain of muscle, fascia and tendon of long head of biceps, right arm, initial encounter: Secondary | ICD-10-CM

## 2015-11-03 DIAGNOSIS — G8929 Other chronic pain: Secondary | ICD-10-CM

## 2015-11-03 DIAGNOSIS — T148 Other injury of unspecified body region: Secondary | ICD-10-CM | POA: Diagnosis not present

## 2015-11-03 DIAGNOSIS — W57XXXA Bitten or stung by nonvenomous insect and other nonvenomous arthropods, initial encounter: Secondary | ICD-10-CM | POA: Diagnosis not present

## 2015-11-03 DIAGNOSIS — R109 Unspecified abdominal pain: Secondary | ICD-10-CM | POA: Diagnosis not present

## 2015-11-03 DIAGNOSIS — I10 Essential (primary) hypertension: Secondary | ICD-10-CM | POA: Diagnosis not present

## 2015-11-03 DIAGNOSIS — N400 Enlarged prostate without lower urinary tract symptoms: Secondary | ICD-10-CM

## 2015-11-03 DIAGNOSIS — S46001A Unspecified injury of muscle(s) and tendon(s) of the rotator cuff of right shoulder, initial encounter: Secondary | ICD-10-CM

## 2015-11-03 LAB — URINALYSIS, ROUTINE W REFLEX MICROSCOPIC
Bilirubin Urine: NEGATIVE
Glucose, UA: NEGATIVE
HGB URINE DIPSTICK: NEGATIVE
KETONES UR: NEGATIVE
LEUKOCYTES UA: NEGATIVE
NITRITE: NEGATIVE
PH: 5.5 (ref 5.0–8.0)
Protein, ur: NEGATIVE
SPECIFIC GRAVITY, URINE: 1.025 (ref 1.001–1.035)

## 2015-11-03 MED ORDER — DULOXETINE HCL 30 MG PO CPEP: 30.0000 mg | ORAL_CAPSULE | Freq: Every day | ORAL | Status: DC

## 2015-11-03 MED ORDER — ONDANSETRON HCL 4 MG PO TABS: 4.0000 mg | ORAL_TABLET | Freq: Three times a day (TID) | ORAL | Status: DC | PRN

## 2015-11-03 MED ORDER — METHYLPREDNISOLONE ACETATE 40 MG/ML IJ SUSP
40.0000 mg | Freq: Once | INTRAMUSCULAR | Status: AC
  Administered 2015-11-03: 40 mg via INTRAMUSCULAR

## 2015-11-03 MED ORDER — DOXAZOSIN MESYLATE ER 4 MG PO TB24: 4.0000 mg | ORAL_TABLET | Freq: Every day | ORAL | Status: DC

## 2015-11-03 MED ORDER — PERMETHRIN 5 % EX CREA: 1.0000 "application " | TOPICAL_CREAM | Freq: Once | CUTANEOUS | Status: DC

## 2015-11-03 MED ORDER — DEXLANSOPRAZOLE 60 MG PO CPDR: 60.0000 mg | DELAYED_RELEASE_CAPSULE | Freq: Every day | ORAL | Status: DC

## 2015-11-03 MED ORDER — TRAMADOL HCL 50 MG PO TABS: 50.0000 mg | ORAL_TABLET | Freq: Three times a day (TID) | ORAL | Status: DC | PRN

## 2015-11-03 NOTE — Patient Instructions (Signed)
Take pain medication at night  We will call with urine results Use cream for the insect bites Steroid shot given  F/U 6 MONTHS

## 2015-11-03 NOTE — Progress Notes (Signed)
Patient ID: Brandon Lawrence, male   DOB: 02/06/1961, 55 y.o.   MRN: [id number]    Subjective:    Patient ID: Brandon Lawrence, male    DOB: 1960-06-10, 55 y.o.   MRN: [id number]  Patient presents for 4 month F/U    Patient to follow-up chronic medical problems. He is currently being treated for hypertension He is also followed by gastroenterology for recurrent nausea vomiting-some of this was thought to be secondary to use use of marijuana , chronic constipation on miralax  abdominal pain he has reflux as well. Medications reviewed in detail  His last set of labs done in August before his EGD showed stable hemoglobin  Itchy rash for past 3 days, changes his detergentent and soap but does this all the time used calamine lotion , no bendaryl taken   Right shoulder pain for months- he hit is arm and seems he hyperextended about 2 months ago and still cant raise above shoulder height and has pain on and off    Had mild dysuria a couple days, no penile drainage, has improved some , no fever   Review Of Systems:  GEN- denies fatigue, fever, weight loss,weakness, recent illness HEENT- denies eye drainage, change in vision, nasal discharge, CVS- denies chest pain, palpitations RESP- denies SOB, cough, wheeze ABD- denies N/V, change in stools, abd pain GU-+dysuria, hematuria, dribbling, incontinence MSK- denies joint pain, muscle aches, injury Neuro- denies headache, dizziness, syncope, seizure activity       Objective:    BP 124/62 mmHg  Pulse 72  Temp(Src) 98.9 F (37.2 C) (Oral)  Resp 12  Ht 5\' 6"  (1.676 m)  Wt 134 lb (60.782 kg)  BMI 21.64 kg/m2 GEN- NAD, alert and oriented x3 HEENT- PERRL, EOMI, non injected sclera, pink conjunctiva, MMM, oropharynx clear Neck- Supple, no thyromegaly CVS- RRR, no murmur RESP-CTAB ABD-NABS,soft,NT,ND Skin- bite like lesions scattered on upper ext, back, legs no lesions between finger, no lesions on palms, no urticaria seen  MSK- Decreased  ROM RUE compared left, bulge at biceps with concave appearance to right axilla, +empty can right side  EXT- No edema Pulses- Radial  2+        Assessment & Plan:      Problem List Items Addressed This Visit    GERD (gastroesophageal reflux disease)   Relevant Medications   ondansetron (ZOFRAN) 4 MG tablet   dexlansoprazole (DEXILANT) 60 MG capsule   Essential hypertension, benign - Primary    controlled      Chronic abdominal pain   Relevant Medications   ondansetron (ZOFRAN) 4 MG tablet   DULoxetine (CYMBALTA) 30 MG capsule   traMADol (ULTRAM) 50 MG tablet   methylPREDNISolone acetate (DEPO-MEDROL) injection 40 mg (Completed)   BPH (benign prostatic hypertrophy)    Continue cardura      Relevant Medications   doxazosin (CARDURA XL) 4 MG 24 hr tablet    Other Visit Diagnoses    Dysuria        has BPH, send urine for culture, rule out UTI    Relevant Orders    Urinalysis, Routine w reflex microscopic (not at [facility]) (Completed)    Urine culture (Completed)    Biceps muscle tear, right, initial encounter        concern for tear of biceps, will send to ortho Ultram given for pain , may needs MRI    Rotator cuff injury, right, initial encounter        Insect bites  treat with permethrin ,also given depo Medrol injection    Relevant Medications    methylPREDNISolone acetate (DEPO-MEDROL) injection 40 mg (Completed)       Note: This dictation was prepared with Dragon dictation along with smaller phrase technology. Any transcriptional errors that result from this process are unintentional.

## 2015-11-05 ENCOUNTER — Encounter: Payer: Self-pay | Admitting: Family Medicine

## 2015-11-05 LAB — URINE CULTURE
COLONY COUNT: NO GROWTH
Organism ID, Bacteria: NO GROWTH

## 2015-11-05 NOTE — Assessment & Plan Note (Signed)
Continue cardura 

## 2015-11-05 NOTE — Assessment & Plan Note (Signed)
controlled 

## 2015-11-14 ENCOUNTER — Encounter: Payer: Self-pay | Admitting: Nurse Practitioner

## 2015-11-14 ENCOUNTER — Ambulatory Visit (INDEPENDENT_AMBULATORY_CARE_PROVIDER_SITE_OTHER): Payer: Medicare Other | Admitting: Nurse Practitioner

## 2015-11-14 VITALS — BP 117/61 | HR 56 | Temp 98.1°F | Ht 66.0 in | Wt 143.6 lb

## 2015-11-14 DIAGNOSIS — R112 Nausea with vomiting, unspecified: Secondary | ICD-10-CM

## 2015-11-14 DIAGNOSIS — K219 Gastro-esophageal reflux disease without esophagitis: Secondary | ICD-10-CM | POA: Diagnosis not present

## 2015-11-14 DIAGNOSIS — D649 Anemia, unspecified: Secondary | ICD-10-CM

## 2015-11-14 DIAGNOSIS — R1030 Lower abdominal pain, unspecified: Secondary | ICD-10-CM

## 2015-11-14 NOTE — Patient Instructions (Signed)
1. Have your labs drawn when you're able to. 2. Depending on your labs, we may need to do another procedure or refer you to hematology to try to figure out why you are anemic. 3. Continue taking her current medications. 4. Return for follow-up in 6 months.

## 2015-11-14 NOTE — Assessment & Plan Note (Addendum)
Anemia which appears stable over the last several months, no changes in MCV, MCH, iron, or ferritin. Possibly multifactorial without overt noted GI bleeding. I will check a CBC today for follow-up. May require Givens capsule endoscopy in the future pending labs trend. Return for follow-up in 6 months.

## 2015-11-14 NOTE — Assessment & Plan Note (Signed)
Currently well-controlled on Dexilant. Continue to take Dexilant, return for follow-up in 6 months.

## 2015-11-14 NOTE — Progress Notes (Signed)
cc'ed to pcp °

## 2015-11-14 NOTE — Assessment & Plan Note (Signed)
Currently asymptomatic. Continue to monitor. Return for follow-up in 6 months.
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
## 2015-11-14 NOTE — Assessment & Plan Note (Signed)
Currently well-controlled on Zofran. This is likely helped and the fact that he has cut back on his use of marijuana due to cannabinoid hyperemesis syndrome as a likely etiology. He continues to cut back and eventually "I would like to stop smoking at all together."

## 2015-11-24 DIAGNOSIS — D649 Anemia, unspecified: Secondary | ICD-10-CM | POA: Diagnosis not present

## 2015-11-24 DIAGNOSIS — K219 Gastro-esophageal reflux disease without esophagitis: Secondary | ICD-10-CM | POA: Diagnosis not present

## 2015-11-24 DIAGNOSIS — R1030 Lower abdominal pain, unspecified: Secondary | ICD-10-CM | POA: Diagnosis not present

## 2015-11-24 DIAGNOSIS — R112 Nausea with vomiting, unspecified: Secondary | ICD-10-CM | POA: Diagnosis not present

## 2015-11-25 LAB — CBC WITH DIFFERENTIAL/PLATELET
Basophils Absolute: 0 cells/uL (ref 0–200)
Basophils Relative: 0 %
EOS ABS: 256 {cells}/uL (ref 15–500)
Eosinophils Relative: 4 %
HEMATOCRIT: 35.7 % — AB (ref 38.5–50.0)
Hemoglobin: 11.5 g/dL — ABNORMAL LOW (ref 13.2–17.1)
Lymphocytes Relative: 43 %
Lymphs Abs: 2752 cells/uL (ref 850–3900)
MCH: 30.5 pg (ref 27.0–33.0)
MCHC: 32.2 g/dL (ref 32.0–36.0)
MCV: 94.7 fL (ref 80.0–100.0)
MONO ABS: 448 {cells}/uL (ref 200–950)
MPV: 9.6 fL (ref 7.5–12.5)
Monocytes Relative: 7 %
NEUTROS PCT: 46 %
Neutro Abs: 2944 cells/uL (ref 1500–7800)
Platelets: 220 10*3/uL (ref 140–400)
RBC: 3.77 MIL/uL — ABNORMAL LOW (ref 4.20–5.80)
RDW: 14.8 % (ref 11.0–15.0)
WBC: 6.4 10*3/uL (ref 3.8–10.8)

## 2015-11-29 ENCOUNTER — Encounter: Payer: Self-pay | Admitting: Family Medicine

## 2015-12-01 ENCOUNTER — Other Ambulatory Visit: Payer: Self-pay | Admitting: Urology

## 2015-12-01 ENCOUNTER — Ambulatory Visit (HOSPITAL_COMMUNITY)
Admission: RE | Admit: 2015-12-01 | Discharge: 2015-12-01 | Disposition: A | Discharge status: Home / Self Care | Payer: Medicare Other | Source: Ambulatory Visit | Attending: Urology | Admitting: Urology

## 2015-12-01 DIAGNOSIS — C642 Malignant neoplasm of left kidney, except renal pelvis: Secondary | ICD-10-CM

## 2015-12-01 DIAGNOSIS — R0602 Shortness of breath: Secondary | ICD-10-CM | POA: Diagnosis not present

## 2015-12-01 DIAGNOSIS — N400 Enlarged prostate without lower urinary tract symptoms: Secondary | ICD-10-CM | POA: Diagnosis not present

## 2015-12-04 DIAGNOSIS — C642 Malignant neoplasm of left kidney, except renal pelvis: Secondary | ICD-10-CM | POA: Diagnosis not present

## 2015-12-04 DIAGNOSIS — N400 Enlarged prostate without lower urinary tract symptoms: Secondary | ICD-10-CM | POA: Diagnosis not present

## 2016-01-06 ENCOUNTER — Encounter: Payer: Self-pay | Admitting: Family Medicine

## 2016-01-13 ENCOUNTER — Other Ambulatory Visit: Payer: Self-pay | Admitting: Family Medicine

## 2016-02-05 ENCOUNTER — Ambulatory Visit: Payer: Medicare Other | Admitting: Family Medicine

## 2016-02-06 ENCOUNTER — Ambulatory Visit (INDEPENDENT_AMBULATORY_CARE_PROVIDER_SITE_OTHER): Payer: Medicare Other | Admitting: Family Medicine

## 2016-02-06 ENCOUNTER — Encounter: Payer: Self-pay | Admitting: Family Medicine

## 2016-02-06 VITALS — BP 122/68 | HR 64 | Temp 99.0°F | Resp 14 | Ht 66.0 in | Wt 129.0 lb

## 2016-02-06 DIAGNOSIS — K5901 Slow transit constipation: Secondary | ICD-10-CM

## 2016-02-06 DIAGNOSIS — R109 Unspecified abdominal pain: Secondary | ICD-10-CM | POA: Diagnosis not present

## 2016-02-06 DIAGNOSIS — G8929 Other chronic pain: Secondary | ICD-10-CM

## 2016-02-06 DIAGNOSIS — R634 Abnormal weight loss: Secondary | ICD-10-CM | POA: Diagnosis not present

## 2016-02-06 DIAGNOSIS — K219 Gastro-esophageal reflux disease without esophagitis: Secondary | ICD-10-CM

## 2016-02-06 DIAGNOSIS — J209 Acute bronchitis, unspecified: Secondary | ICD-10-CM | POA: Diagnosis not present

## 2016-02-06 MED ORDER — POLYETHYLENE GLYCOL 3350 17 GM/SCOOP PO POWD: ORAL | 1 refills | Status: DC

## 2016-02-06 MED ORDER — DEXLANSOPRAZOLE 60 MG PO CPDR: 60.0000 mg | DELAYED_RELEASE_CAPSULE | Freq: Every day | ORAL | 6 refills | Status: DC

## 2016-02-06 MED ORDER — LINACLOTIDE 145 MCG PO CAPS: 145.0000 ug | ORAL_CAPSULE | Freq: Every day | ORAL | 0 refills | Status: DC

## 2016-02-06 MED ORDER — ALBUTEROL SULFATE HFA 108 (90 BASE) MCG/ACT IN AERS: 2.0000 | INHALATION_SPRAY | Freq: Four times a day (QID) | RESPIRATORY_TRACT | 0 refills | Status: DC | PRN

## 2016-02-06 MED ORDER — ONDANSETRON HCL 4 MG PO TABS: 4.0000 mg | ORAL_TABLET | Freq: Three times a day (TID) | ORAL | 1 refills | Status: DC | PRN

## 2016-02-06 MED ORDER — DOXAZOSIN MESYLATE ER 4 MG PO TB24: 4.0000 mg | ORAL_TABLET | Freq: Every day | ORAL | 6 refills | Status: DC

## 2016-02-06 MED ORDER — AZITHROMYCIN 250 MG PO TABS: ORAL_TABLET | ORAL | 0 refills | Status: DC

## 2016-02-06 MED ORDER — DULOXETINE HCL 30 MG PO CPEP: 30.0000 mg | ORAL_CAPSULE | Freq: Every day | ORAL | 3 refills | Status: DC

## 2016-02-06 NOTE — Progress Notes (Signed)
   Subjective:    Patient ID: Brandon Lawrence, male    DOB: Nov 02, 1960, 54 y.o.   MRN: [id number]  Patient presents for Illness (x3 days- productive cough with yellow mucus, body aches, sinus pressure, ear pain)   Body aches, cough with productions, sinus pressure, ear pain for past 3 days. Taking TYlenol sinus . Has also had some wheezing and felt tight in chest   Constipation- having hard bowel movements, unable to use restroom for about 3 days,using Miralax daily not helping anymore   On review of vitals has lost 14lbs unintentionally since July ,history of RCC  Review Of Systems:  GEN- denies fatigue, fever, +weight loss,weakness, recent illness HEENT- denies eye drainage, change in vision,+ nasal discharge, CVS- denies chest pain, palpitations RESP- denies SOB, +cough, +wheeze ABD- denies N/V,+ change in stools, abd pain GU- denies dysuria, hematuria, dribbling, incontinence MSK- denies joint pain, muscle aches, injury Neuro- denies headache, dizziness, syncope, seizure activity       Objective:    BP 122/68 (BP Location: Left Arm, Patient Position: Sitting, Cuff Size: Normal)   Pulse 64   Temp 99 F (37.2 C) (Oral)   Resp 14   Ht 5\' 6"  (1.676 m)   Wt 129 lb (58.5 kg)   BMI 20.82 kg/m  GEN- NAD, alert and oriented x3, weight loss  HEENT- PERRL, EOMI, non injected sclera, pink conjunctiva, MMM, oropharynx mild injection, TM clear bilat no effusion,  + maxillary sinus tenderness, inflammed turbinates,  Nasal drainage  Neck- Supple, no LAD CVS- RRR, no murmur RESP- bilat wheeze, normal WOB,congestion, no rales  ABD-NABS,soft,NT,ND  EXT- No edema Pulses- Radial 2+          Assessment & Plan:      Problem List Items Addressed This Visit    GERD (gastroesophageal reflux disease)   Relevant Medications   linaclotide (LINZESS) 145 MCG CAPS capsule   ondansetron (ZOFRAN) 4 MG tablet   dexlansoprazole (DEXILANT) 60 MG capsule   polyethylene glycol powder  (GLYCOLAX/MIRALAX) powder   Constipation    Chronic abd pain and constipation Given sample of Linzess to try in addition to miralax  With his weight loss concerned as he has history of renal cell CA, he did urology see few months ago, had normal PSA. He is a smoker as well Obtain CXR, obtain CT abdomen/pelvis to further evaluation weight loss  Other differential would be chronic GI upset and nausea from his marijuana use and he is not taking in enough calories   Acute bronchitis- smoker, given zpak, albuterol inhaler, robitussin DM      Chronic abdominal pain   Relevant Medications   DULoxetine (CYMBALTA) 30 MG capsule   ondansetron (ZOFRAN) 4 MG tablet   Other Relevant Orders   CT Abdomen Pelvis Wo Contrast    Other Visit Diagnoses    Loss of weight    -  Primary   Relevant Orders   Comprehensive metabolic panel   CBC with Differential/Platelet   TSH   DG Chest 2 View   CT Abdomen Pelvis Wo Contrast   Acute bronchitis, unspecified organism          Note: This dictation was prepared with Dragon dictation along with smaller phrase technology. Any transcriptional errors that result from this process are unintentional.

## 2016-02-06 NOTE — Patient Instructions (Addendum)
Try the linzess take 1 capsule a day for constipation For your weight- CT scan of stomach, Chest xray and labs For bronchitis- use the inhaler, take antibiotics, use Robitussin DM  F/U pending results

## 2016-02-06 NOTE — Assessment & Plan Note (Addendum)
Chronic abd pain and constipation Given sample of Linzess to try in addition to miralax  With his weight loss concerned as he has history of renal cell CA, he did urology see few months ago, had normal PSA. He is a smoker as well Obtain CXR, obtain CT abdomen/pelvis to further evaluation weight loss  Other differential would be chronic GI upset and nausea from his marijuana use and he is not taking in enough calories   Acute bronchitis- smoker, given zpak, albuterol inhaler, robitussin DM

## 2016-02-07 LAB — COMPREHENSIVE METABOLIC PANEL
ALT: 9 U/L (ref 9–46)
AST: 15 U/L (ref 10–35)
Albumin: 3.9 g/dL (ref 3.6–5.1)
Alkaline Phosphatase: 44 U/L (ref 40–115)
BILIRUBIN TOTAL: 0.3 mg/dL (ref 0.2–1.2)
BUN: 14 mg/dL (ref 7–25)
CHLORIDE: 102 mmol/L (ref 98–110)
CO2: 30 mmol/L (ref 20–31)
Calcium: 9.6 mg/dL (ref 8.6–10.3)
Creat: 1.2 mg/dL (ref 0.70–1.33)
GLUCOSE: 91 mg/dL (ref 70–99)
POTASSIUM: 4.3 mmol/L (ref 3.5–5.3)
Sodium: 139 mmol/L (ref 135–146)
Total Protein: 6.9 g/dL (ref 6.1–8.1)

## 2016-02-07 LAB — CBC WITH DIFFERENTIAL/PLATELET
BASOS PCT: 1 %
Basophils Absolute: 63 cells/uL (ref 0–200)
EOS ABS: 378 {cells}/uL (ref 15–500)
EOS PCT: 6 %
HCT: 39.7 % (ref 38.5–50.0)
Hemoglobin: 13.3 g/dL (ref 13.0–17.0)
Lymphocytes Relative: 40 %
Lymphs Abs: 2520 cells/uL (ref 850–3900)
MCH: 31.1 pg (ref 27.0–33.0)
MCHC: 33.5 g/dL (ref 32.0–36.0)
MCV: 92.8 fL (ref 80.0–100.0)
MONOS PCT: 13 %
MPV: 10.2 fL (ref 7.5–12.5)
Monocytes Absolute: 819 cells/uL (ref 200–950)
NEUTROS ABS: 2520 {cells}/uL (ref 1500–7800)
Neutrophils Relative %: 40 %
PLATELETS: 170 10*3/uL (ref 140–400)
RBC: 4.28 MIL/uL (ref 4.20–5.80)
RDW: 14.3 % (ref 11.0–15.0)
WBC: 6.3 10*3/uL (ref 3.8–10.8)

## 2016-02-07 LAB — TSH: TSH: 1.46 m[IU]/L (ref 0.40–4.50)

## 2016-02-09 ENCOUNTER — Ambulatory Visit (HOSPITAL_COMMUNITY)
Admission: RE | Admit: 2016-02-09 | Discharge: 2016-02-09 | Disposition: A | Discharge status: Home / Self Care | Payer: Medicare Other | Source: Ambulatory Visit | Attending: Family Medicine | Admitting: Family Medicine

## 2016-02-09 DIAGNOSIS — R634 Abnormal weight loss: Secondary | ICD-10-CM | POA: Diagnosis not present

## 2016-02-09 DIAGNOSIS — R0602 Shortness of breath: Secondary | ICD-10-CM | POA: Diagnosis not present

## 2016-02-09 DIAGNOSIS — R079 Chest pain, unspecified: Secondary | ICD-10-CM | POA: Diagnosis not present

## 2016-02-23 ENCOUNTER — Ambulatory Visit (HOSPITAL_COMMUNITY)
Admission: RE | Admit: 2016-02-23 | Discharge: 2016-02-23 | Disposition: A | Discharge status: Home / Self Care | Payer: Medicare Other | Source: Ambulatory Visit | Attending: Family Medicine | Admitting: Family Medicine

## 2016-02-23 DIAGNOSIS — R634 Abnormal weight loss: Secondary | ICD-10-CM | POA: Insufficient documentation

## 2016-02-23 DIAGNOSIS — G8929 Other chronic pain: Secondary | ICD-10-CM | POA: Diagnosis not present

## 2016-02-23 DIAGNOSIS — R109 Unspecified abdominal pain: Secondary | ICD-10-CM | POA: Insufficient documentation

## 2016-02-23 DIAGNOSIS — Z905 Acquired absence of kidney: Secondary | ICD-10-CM | POA: Diagnosis not present

## 2016-02-29 ENCOUNTER — Other Ambulatory Visit: Payer: Self-pay | Admitting: *Deleted

## 2016-02-29 MED ORDER — MIRTAZAPINE 15 MG PO TABS: 7.5000 mg | ORAL_TABLET | Freq: Every day | ORAL | 1 refills | Status: DC

## 2016-04-08 ENCOUNTER — Ambulatory Visit: Payer: Self-pay | Admitting: Family Medicine

## 2016-04-09 ENCOUNTER — Telehealth: Payer: Self-pay | Admitting: *Deleted

## 2016-04-09 NOTE — Telephone Encounter (Signed)
Received VM from patient.   Patient states that he has questions about appointment.   Please call him at [phone].

## 2016-04-12 ENCOUNTER — Encounter: Payer: Self-pay | Admitting: Family Medicine

## 2016-04-12 ENCOUNTER — Ambulatory Visit (INDEPENDENT_AMBULATORY_CARE_PROVIDER_SITE_OTHER): Payer: Medicare Other | Admitting: Family Medicine

## 2016-04-12 VITALS — BP 132/70 | HR 88 | Temp 98.5°F | Resp 14 | Ht 66.0 in | Wt 136.0 lb

## 2016-04-12 DIAGNOSIS — G8929 Other chronic pain: Secondary | ICD-10-CM | POA: Diagnosis not present

## 2016-04-12 DIAGNOSIS — K219 Gastro-esophageal reflux disease without esophagitis: Secondary | ICD-10-CM

## 2016-04-12 DIAGNOSIS — Z23 Encounter for immunization: Secondary | ICD-10-CM | POA: Diagnosis not present

## 2016-04-12 DIAGNOSIS — R634 Abnormal weight loss: Secondary | ICD-10-CM

## 2016-04-12 DIAGNOSIS — M1990 Unspecified osteoarthritis, unspecified site: Secondary | ICD-10-CM

## 2016-04-12 DIAGNOSIS — R109 Unspecified abdominal pain: Secondary | ICD-10-CM

## 2016-04-12 MED ORDER — TRAMADOL HCL 50 MG PO TABS: 50.0000 mg | ORAL_TABLET | Freq: Three times a day (TID) | ORAL | 1 refills | Status: DC | PRN

## 2016-04-12 MED ORDER — LINACLOTIDE 145 MCG PO CAPS: 145.0000 ug | ORAL_CAPSULE | Freq: Every day | ORAL | 2 refills | Status: DC

## 2016-04-12 MED ORDER — ONDANSETRON HCL 4 MG PO TABS: 4.0000 mg | ORAL_TABLET | Freq: Three times a day (TID) | ORAL | 1 refills | Status: DC | PRN

## 2016-04-12 MED ORDER — DULOXETINE HCL 30 MG PO CPEP: 30.0000 mg | ORAL_CAPSULE | Freq: Every day | ORAL | 3 refills | Status: DC

## 2016-04-12 NOTE — Telephone Encounter (Signed)
Patient seen at appt on 04/12/2016.

## 2016-04-12 NOTE — Patient Instructions (Signed)
Take the sleeping pill at 9pm Take the linzess for constipation Medication refilled Ultram for pain for arthritis F/U 4 months

## 2016-04-12 NOTE — Progress Notes (Signed)
   Subjective:    Patient ID: Brandon Lawrence, male    DOB: [birth year], 54 y.o.   MRN: [id number]  Patient presents for Weight Check   Weight up 7lbs since last visit in Oct. Started on remeron at bedtime, taking 7.5mg  once a day , states appetite is good but he stills gets nausea, has appt with GI in Jan, he is taking his PPI    Constipation-Miralax is not helping, the linzess did help, given samples last visit   He things some of his meds are going up but not sure which ones   Has known OA, DDD, requested refill on Ultram , back is worse in winter , radiating symptoms no new paresthesia   Review Of Systems:  GEN- denies fatigue, fever, weight loss,weakness, recent illness HEENT- denies eye drainage, change in vision, nasal discharge, CVS- denies chest pain, palpitations RESP- denies SOB, cough, wheeze ABD- denies N/V, change in stools, abd pain GU- denies dysuria, hematuria, dribbling, incontinence MSK-+joint pain, muscle aches, injury Neuro- denies headache, dizziness, syncope, seizure activity       Objective:    BP 132/70 (BP Location: Left Arm, Patient Position: Sitting, Cuff Size: Large)   Pulse 88   Temp 98.5 F (36.9 C) (Oral)   Resp 14   Ht 5\' 6"  (1.676 m)   Wt 136 lb (61.7 kg)   SpO2 98%   BMI 21.95 kg/m  GEN- NAD, alert and oriented x3,walks with cane/limp HEENT- PERRL, EOMI, non injected sclera, pink conjunctiva, MMM, oropharynx clear CVS- RRR, no murmur RESP-CTAB ABD-NABS,soft,NT,ND Pulses- Radial  2+        Assessment & Plan:      Problem List Items Addressed This Visit    GERD (gastroesophageal reflux disease)    Improved with PPI       Relevant Medications   ondansetron (ZOFRAN) 4 MG tablet   linaclotide (LINZESS) 145 MCG CAPS capsule   Chronic abdominal pain    Continue f/u with GI Weight improved with remeron will continue For consitpation given script for Linzess, he state his Miralax is no longer covered by insurance       Relevant Medications   ondansetron (ZOFRAN) 4 MG tablet   DULoxetine (CYMBALTA) 30 MG capsule   traMADol (ULTRAM) 50 MG tablet   Arthritis - Primary    Refilled ultram       Relevant Medications   traMADol (ULTRAM) 50 MG tablet    Other Visit Diagnoses    Need for prophylactic vaccination and inoculation against influenza       Relevant Orders   Flu Vaccine QUAD 36+ mos PF IM (Fluarix & Fluzone Quad PF) (Completed)   Loss of weight       improved with remeron for appetite continue at low dose , to take around 9pm      Note: This dictation was prepared with Dragon dictation along with smaller phrase technology. Any transcriptional errors that result from this process are unintentional.

## 2016-04-14 ENCOUNTER — Encounter: Payer: Self-pay | Admitting: Family Medicine

## 2016-04-14 NOTE — Assessment & Plan Note (Signed)
Improved with PPI

## 2016-04-14 NOTE — Assessment & Plan Note (Signed)
Refilled ultram

## 2016-04-14 NOTE — Assessment & Plan Note (Signed)
Continue f/u with GI Weight improved with remeron will continue For consitpation given script for Linzess, he state his Miralax is no longer covered by insurance

## 2016-04-19 NOTE — Telephone Encounter (Signed)
Left msg with family member asking them to have patient return my call.

## 2016-05-07 ENCOUNTER — Ambulatory Visit: Payer: Medicare Other | Admitting: Family Medicine

## 2016-05-10 DIAGNOSIS — F329 Major depressive disorder, single episode, unspecified: Secondary | ICD-10-CM | POA: Diagnosis not present

## 2016-05-10 DIAGNOSIS — F419 Anxiety disorder, unspecified: Secondary | ICD-10-CM | POA: Diagnosis not present

## 2016-05-16 ENCOUNTER — Ambulatory Visit (INDEPENDENT_AMBULATORY_CARE_PROVIDER_SITE_OTHER): Payer: Medicare PPO | Admitting: Nurse Practitioner

## 2016-05-16 ENCOUNTER — Encounter: Payer: Self-pay | Admitting: Nurse Practitioner

## 2016-05-16 ENCOUNTER — Encounter: Payer: Self-pay | Admitting: Internal Medicine

## 2016-05-16 VITALS — BP 146/68 | HR 60 | Temp 97.8°F | Ht 65.0 in | Wt 146.6 lb

## 2016-05-16 DIAGNOSIS — K5901 Slow transit constipation: Secondary | ICD-10-CM

## 2016-05-16 DIAGNOSIS — R112 Nausea with vomiting, unspecified: Secondary | ICD-10-CM | POA: Diagnosis not present

## 2016-05-16 DIAGNOSIS — K219 Gastro-esophageal reflux disease without esophagitis: Secondary | ICD-10-CM | POA: Diagnosis not present

## 2016-05-16 DIAGNOSIS — D649 Anemia, unspecified: Secondary | ICD-10-CM

## 2016-05-16 MED ORDER — DEXLANSOPRAZOLE 60 MG PO CPDR: 60.0000 mg | DELAYED_RELEASE_CAPSULE | Freq: Every day | ORAL | 11 refills | Status: DC
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
## 2016-05-16 NOTE — Progress Notes (Signed)
cc'ed to pcp °

## 2016-05-16 NOTE — Assessment & Plan Note (Signed)
Some intermittent constipation. Was put on Linzess 145 g which he stated caused adverse effect of not being able to no/control when he has to go to the bathroom. He subsequently stop taking it after 1 or 2 doses. Explained him that he should notify the prescriber of any adverse effects. I will give him samples of Dexon 72 g today. I'm asking him to call in 1-2 weeks with a progress report on if this is helping any. Explained the likelihood he will have diarrhea initially which will improve typically within a week. Return for follow-up in 2 months.

## 2016-05-16 NOTE — Patient Instructions (Signed)
1. Have your lab work drawn as soon as she can. 2. When he complete the stool test bring it back to our office. 3. I send in a prescription for Dexilant to your pharmacy. 4. I'm giving you samples of Linzess 72 g, which is a lower dose. He may have diarrhea with this medicine but it shouldn't get better on its own after about 5-7 days. 5. Call us in 1-2 weeks and let us know if it is working. 6. Return for follow-up in 2 months.

## 2016-05-16 NOTE — Assessment & Plan Note (Signed)
Long-standing history of GERD, was doing quite well on Dexilant but he ran out of his medication and did not follow up appropriately to obtain further refills. I will send in a prescription today. Return for follow-up in 2 months. This will likely help his abdominal pain, nausea, vomiting.

## 2016-05-16 NOTE — Assessment & Plan Note (Signed)
History of anemia, last hemoglobin was normal. Today he states he had 1 or 2 episodes of black stools 1-2 weeks ago. Unsure of the accuracy of this. Regardless, I'll check a CBC as well as an iFOBT to make sure he has not had further decline in hemoglobin. Return for follow-up in 2 months or sooner based on lab results. If he continues to have GI bleeding despite normal colonoscopy and endoscopy he will likely be a candidate for [facility] capsule endoscopy.

## 2016-05-16 NOTE — Assessment & Plan Note (Signed)
He is having continuing nausea likely related to his GERD which is very poorly controlled because he ran out of Malone it did not call us requesting a prescription. I will send in a prescription for Dexilant. He continues takes Zofran which helps. Return for follow-up in 2 months

## 2016-05-16 NOTE — Addendum Note (Signed)
Addended by: Gordy Levan, ERIC A on: 05/16/2016 09:43 AM   Modules accepted: Orders

## 2016-05-17 ENCOUNTER — Other Ambulatory Visit (HOSPITAL_COMMUNITY)
Admission: RE | Admit: 2016-05-17 | Discharge: 2016-05-17 | Disposition: A | Discharge status: Home / Self Care | Payer: Medicare PPO | Source: Ambulatory Visit | Attending: Nurse Practitioner | Admitting: Nurse Practitioner

## 2016-05-17 DIAGNOSIS — R112 Nausea with vomiting, unspecified: Secondary | ICD-10-CM | POA: Diagnosis not present

## 2016-05-17 DIAGNOSIS — D649 Anemia, unspecified: Secondary | ICD-10-CM | POA: Insufficient documentation

## 2016-05-17 DIAGNOSIS — K219 Gastro-esophageal reflux disease without esophagitis: Secondary | ICD-10-CM | POA: Insufficient documentation

## 2016-05-17 DIAGNOSIS — K5901 Slow transit constipation: Secondary | ICD-10-CM | POA: Diagnosis not present

## 2016-05-17 LAB — CBC WITH DIFFERENTIAL/PLATELET
Basophils Absolute: 0 K/uL (ref 0.0–0.1)
Basophils Relative: 0 %
Eosinophils Absolute: 0.2 K/uL (ref 0.0–0.7)
Eosinophils Relative: 3 %
HCT: 40 % (ref 39.0–52.0)
Hemoglobin: 13.7 g/dL (ref 13.0–17.0)
Lymphocytes Relative: 29 %
Lymphs Abs: 2.2 K/uL (ref 0.7–4.0)
MCH: 31.6 pg (ref 26.0–34.0)
MCHC: 34.3 g/dL (ref 30.0–36.0)
MCV: 92.2 fL (ref 78.0–100.0)
Monocytes Absolute: 0.4 K/uL (ref 0.1–1.0)
Monocytes Relative: 6 %
Neutro Abs: 4.8 K/uL (ref 1.7–7.7)
Neutrophils Relative %: 62 %
Platelets: 194 K/uL (ref 150–400)
RBC: 4.34 MIL/uL (ref 4.22–5.81)
RDW: 14.3 % (ref 11.5–15.5)
WBC: 7.7 K/uL (ref 4.0–10.5)

## 2016-05-21 ENCOUNTER — Ambulatory Visit (INDEPENDENT_AMBULATORY_CARE_PROVIDER_SITE_OTHER): Payer: Medicare PPO

## 2016-05-21 DIAGNOSIS — D649 Anemia, unspecified: Secondary | ICD-10-CM | POA: Diagnosis not present

## 2016-05-21 LAB — IFOBT (OCCULT BLOOD): IFOBT: NEGATIVE

## 2016-05-30 ENCOUNTER — Telehealth: Payer: Self-pay | Admitting: *Deleted

## 2016-05-30 NOTE — Telephone Encounter (Signed)
Received request from pharmacy for PA on Cardura.  PA submitted.   Dx: N40- BPH         I10- HTN  The request has received a Pending outcome.  PA Case [id number] Status: Pending review.  You will receive a final determination electronically in CoverMyMeds and via email and fax within 24 to 72 hours.

## 2016-06-03 MED ORDER — DOXAZOSIN MESYLATE ER 4 MG PO TB24: 4.0000 mg | ORAL_TABLET | Freq: Every day | ORAL | 6 refills | Status: DC

## 2016-06-03 NOTE — Telephone Encounter (Signed)
Received PA determination.   PA denied.   Generic doxazosin is covered by formulary.   Prescription sent to pharmacy.

## 2016-06-05 DIAGNOSIS — F419 Anxiety disorder, unspecified: Secondary | ICD-10-CM | POA: Diagnosis not present

## 2016-06-05 DIAGNOSIS — F329 Major depressive disorder, single episode, unspecified: Secondary | ICD-10-CM | POA: Diagnosis not present

## 2016-06-06 ENCOUNTER — Other Ambulatory Visit: Payer: Self-pay | Admitting: *Deleted

## 2016-06-06 MED ORDER — DOXAZOSIN MESYLATE 4 MG PO TABS: 4.0000 mg | ORAL_TABLET | Freq: Every day | ORAL | 3 refills | Status: DC

## 2016-07-16 ENCOUNTER — Ambulatory Visit: Payer: Medicare Other | Admitting: Nurse Practitioner

## 2016-08-04 DIAGNOSIS — Z9289 Personal history of other medical treatment: Secondary | ICD-10-CM

## 2016-08-04 HISTORY — DX: Personal history of other medical treatment: Z92.89

## 2016-08-06 ENCOUNTER — Encounter: Payer: Self-pay | Admitting: Nurse Practitioner

## 2016-08-06 ENCOUNTER — Encounter (HOSPITAL_COMMUNITY): Payer: Self-pay | Admitting: Emergency Medicine

## 2016-08-06 ENCOUNTER — Ambulatory Visit (INDEPENDENT_AMBULATORY_CARE_PROVIDER_SITE_OTHER): Payer: Medicare PPO | Admitting: Nurse Practitioner

## 2016-08-06 ENCOUNTER — Emergency Department (HOSPITAL_COMMUNITY): Payer: Medicare PPO

## 2016-08-06 ENCOUNTER — Telehealth: Payer: Self-pay

## 2016-08-06 ENCOUNTER — Observation Stay (HOSPITAL_COMMUNITY)
Admission: EM | Admit: 2016-08-06 | Discharge: 2016-08-08 | Disposition: A | Discharge status: Home / Self Care | Payer: Medicare PPO | Attending: Internal Medicine | Admitting: Internal Medicine

## 2016-08-06 VITALS — BP 129/68 | HR 69 | Temp 98.0°F | Ht 65.0 in | Wt 144.2 lb

## 2016-08-06 DIAGNOSIS — Z736 Limitation of activities due to disability: Secondary | ICD-10-CM | POA: Diagnosis present

## 2016-08-06 DIAGNOSIS — K5909 Other constipation: Secondary | ICD-10-CM | POA: Diagnosis not present

## 2016-08-06 DIAGNOSIS — K219 Gastro-esophageal reflux disease without esophagitis: Secondary | ICD-10-CM | POA: Diagnosis not present

## 2016-08-06 DIAGNOSIS — Z87891 Personal history of nicotine dependence: Secondary | ICD-10-CM | POA: Diagnosis not present

## 2016-08-06 DIAGNOSIS — R0600 Dyspnea, unspecified: Secondary | ICD-10-CM

## 2016-08-06 DIAGNOSIS — R079 Chest pain, unspecified: Secondary | ICD-10-CM | POA: Diagnosis present

## 2016-08-06 DIAGNOSIS — Z85528 Personal history of other malignant neoplasm of kidney: Secondary | ICD-10-CM | POA: Diagnosis not present

## 2016-08-06 DIAGNOSIS — I10 Essential (primary) hypertension: Secondary | ICD-10-CM | POA: Insufficient documentation

## 2016-08-06 DIAGNOSIS — R0789 Other chest pain: Principal | ICD-10-CM | POA: Insufficient documentation

## 2016-08-06 DIAGNOSIS — Z5181 Encounter for therapeutic drug level monitoring: Secondary | ICD-10-CM | POA: Insufficient documentation

## 2016-08-06 DIAGNOSIS — K5901 Slow transit constipation: Secondary | ICD-10-CM | POA: Diagnosis not present

## 2016-08-06 DIAGNOSIS — K5900 Constipation, unspecified: Secondary | ICD-10-CM | POA: Diagnosis not present

## 2016-08-06 HISTORY — DX: Essential (primary) hypertension: I10

## 2016-08-06 LAB — CBC
HCT: 35 % — ABNORMAL LOW (ref 39.0–52.0)
HEMATOCRIT: 35.6 % — AB (ref 39.0–52.0)
Hemoglobin: 12.2 g/dL — ABNORMAL LOW (ref 13.0–17.0)
Hemoglobin: 12.1 g/dL — ABNORMAL LOW (ref 13.0–17.0)
MCH: 31.1 pg (ref 26.0–34.0)
MCH: 31.4 pg (ref 26.0–34.0)
MCHC: 34.3 g/dL (ref 30.0–36.0)
MCHC: 34.6 g/dL (ref 30.0–36.0)
MCV: 90.8 fL (ref 78.0–100.0)
MCV: 90.9 fL (ref 78.0–100.0)
PLATELETS: 179 10*3/uL (ref 150–400)
PLATELETS: 180 10*3/uL (ref 150–400)
RBC: 3.92 MIL/uL — ABNORMAL LOW (ref 4.22–5.81)
RBC: 3.85 MIL/uL — ABNORMAL LOW (ref 4.22–5.81)
RDW: 14 % (ref 11.5–15.5)
RDW: 14.2 % (ref 11.5–15.5)
WBC: 6.7 10*3/uL (ref 4.0–10.5)
WBC: 7.2 10*3/uL (ref 4.0–10.5)

## 2016-08-06 LAB — BASIC METABOLIC PANEL
ANION GAP: 5 (ref 5–15)
BUN: 14 mg/dL (ref 6–20)
CALCIUM: 9.2 mg/dL (ref 8.9–10.3)
CO2: 27 mmol/L (ref 22–32)
Chloride: 107 mmol/L (ref 101–111)
Creatinine, Ser: 1.15 mg/dL (ref 0.61–1.24)
Glucose, Bld: 148 mg/dL — ABNORMAL HIGH (ref 65–99)
Potassium: 3.5 mmol/L (ref 3.5–5.1)
Sodium: 139 mmol/L (ref 135–145)

## 2016-08-06 LAB — CREATININE, SERUM
CREATININE: 1.07 mg/dL (ref 0.61–1.24)
GFR calc Af Amer: >60 mL/min (ref >60)

## 2016-08-06 LAB — TROPONIN I

## 2016-08-06 MED ORDER — MIRTAZAPINE 15 MG PO TABS
7.5000 mg | ORAL_TABLET | Freq: Every day | ORAL | Status: DC
  Administered 2016-08-06 – 2016-08-08 (×2): 7.5 mg via ORAL
  Filled 2016-08-06 (×3): qty 1

## 2016-08-06 MED ORDER — DOXAZOSIN MESYLATE 2 MG PO TABS
4.0000 mg | ORAL_TABLET | Freq: Every day | ORAL | Status: DC
  Administered 2016-08-06 – 2016-08-08 (×3): 4 mg via ORAL
  Filled 2016-08-06 (×3): qty 2

## 2016-08-06 MED ORDER — ONDANSETRON HCL 4 MG/2ML IJ SOLN: 4.0000 mg | Freq: Four times a day (QID) | INTRAMUSCULAR | Status: DC | PRN

## 2016-08-06 MED ORDER — GI COCKTAIL ~~LOC~~
30.0000 mL | Freq: Four times a day (QID) | ORAL | Status: DC | PRN
Start: 2016-08-06 — End: 2016-08-08

## 2016-08-06 MED ORDER — FAMOTIDINE 20 MG PO TABS
20.0000 mg | ORAL_TABLET | Freq: Every day | ORAL | Status: DC
  Administered 2016-08-06 – 2016-08-08 (×3): 20 mg via ORAL
  Filled 2016-08-06 (×3): qty 1

## 2016-08-06 MED ORDER — ALBUTEROL SULFATE (2.5 MG/3ML) 0.083% IN NEBU: 3.0000 mL | INHALATION_SOLUTION | Freq: Four times a day (QID) | RESPIRATORY_TRACT | Status: DC | PRN

## 2016-08-06 MED ORDER — ENOXAPARIN SODIUM 40 MG/0.4ML ~~LOC~~ SOLN
40.0000 mg | SUBCUTANEOUS | Status: DC
  Filled 2016-08-06 (×2): qty 0.4

## 2016-08-06 MED ORDER — LINACLOTIDE 72 MCG PO CAPS: 72.0000 ug | ORAL_CAPSULE | Freq: Every day | ORAL | 2 refills | Status: DC

## 2016-08-06 MED ORDER — POTASSIUM CHLORIDE IN NACL 20-0.9 MEQ/L-% IV SOLN
INTRAVENOUS | Status: DC
  Administered 2016-08-06 – 2016-08-07 (×2): via INTRAVENOUS

## 2016-08-06 MED ORDER — TRAMADOL HCL 50 MG PO TABS
50.0000 mg | ORAL_TABLET | Freq: Three times a day (TID) | ORAL | Status: DC | PRN
  Administered 2016-08-08: 50 mg via ORAL
  Filled 2016-08-06: qty 1

## 2016-08-06 MED ORDER — PANTOPRAZOLE SODIUM 40 MG PO TBEC
40.0000 mg | DELAYED_RELEASE_TABLET | Freq: Every day | ORAL | Status: DC
  Administered 2016-08-06 – 2016-08-07 (×2): 40 mg via ORAL
  Filled 2016-08-06 (×2): qty 1

## 2016-08-06 MED ORDER — MORPHINE SULFATE (PF) 2 MG/ML IV SOLN: 2.0000 mg | INTRAVENOUS | Status: DC | PRN

## 2016-08-06 MED ORDER — RANITIDINE HCL 150 MG PO TABS: 150.0000 mg | ORAL_TABLET | ORAL | 1 refills | Status: DC

## 2016-08-06 MED ORDER — LINACLOTIDE 72 MCG PO CAPS
72.0000 ug | ORAL_CAPSULE | Freq: Every day | ORAL | Status: DC
  Administered 2016-08-07 – 2016-08-08 (×2): 72 ug via ORAL
  Filled 2016-08-06 (×3): qty 1

## 2016-08-06 MED ORDER — ACETAMINOPHEN 325 MG PO TABS: 650.0000 mg | ORAL_TABLET | ORAL | Status: DC | PRN

## 2016-08-06 MED ORDER — ASPIRIN EC 325 MG PO TBEC
325.0000 mg | DELAYED_RELEASE_TABLET | Freq: Every day | ORAL | Status: DC
  Administered 2016-08-06 – 2016-08-08 (×3): 325 mg via ORAL
  Filled 2016-08-06 (×3): qty 1

## 2016-08-06 MED ORDER — ZOLPIDEM TARTRATE 5 MG PO TABS: 5.0000 mg | ORAL_TABLET | Freq: Every evening | ORAL | Status: DC | PRN
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
## 2016-08-06 NOTE — Telephone Encounter (Signed)
Hello Dr.[place],  Walden Field NP saw this patient in our office today and he was having chest pain, shortness of breath and sweating symptoms. We have advised the pt to call you, but Randall Hiss wanted me to send you an FYI.  Thank you!!
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
## 2016-08-06 NOTE — Assessment & Plan Note (Signed)
The patient today he admits he has episodes of chest pain with associated diaphoresis and dyspnea. He is not actively having chest pain in the office today. I have recommended he follow up with his primary care for evaluation of his symptoms. We will hold off on upper endoscopy until he is ruled out from a cardiac perspective. ER precautions given related to chest pain. Return for follow-up in 6 weeks.

## 2016-08-06 NOTE — Patient Instructions (Signed)
1. I sent in a prescription of Zantac 150 mg. Take this every other day, in the evening. 2. I sent in a prescription for Linzess 72 g. Take this once a day on an empty stomach to help with constipation. 3. Call your primary care doctor and informed them of your chest pain along with shortness of breath and sweating symptoms. We will also try to send a note to your primary care related to this. 4. Return for follow-up in 6 weeks. 5. If you have significant chest pain was sweating and shortness of breath he should proceed to the emergency room.

## 2016-08-06 NOTE — ED Notes (Signed)
Pt given peanut butter crackers and ginger ale

## 2016-08-06 NOTE — Assessment & Plan Note (Signed)
GERD symptoms with regular breakthrough essentially on a daily basis, per the patient, despite Dexon 60 mg daily. I will add Zantac 150 mg every other evening. He would likely benefit from an EGD for further evaluation but I would prefer he be cleared from a cardiac standpoint given his chest pain symptoms as per below. Return for follow-up in 6 weeks. Call if any worsening symptoms.
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
## 2016-08-06 NOTE — Progress Notes (Signed)
cc'ed to pcp °

## 2016-08-06 NOTE — Telephone Encounter (Signed)
Please tell to go to ER if having cp and SOB
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
## 2016-08-06 NOTE — ED Triage Notes (Signed)
Patient complaining of intermittent chest pain x 1 month. States he was sent over by PCP.

## 2016-08-06 NOTE — Assessment & Plan Note (Signed)
Constipation well controlled on Linzess 72 g daily. He has run out of samples. He did not call with a progress report or to request a prescription. I will send in a prescription of Linzess 72 g once a day on an empty stomach today. Return for follow-up in 6 weeks.

## 2016-08-06 NOTE — Telephone Encounter (Signed)
Call placed to patient.   Advised to go to ER.   Verbalized understanding.

## 2016-08-07 ENCOUNTER — Observation Stay (HOSPITAL_BASED_OUTPATIENT_CLINIC_OR_DEPARTMENT_OTHER): Payer: Medicare PPO

## 2016-08-07 DIAGNOSIS — R0609 Other forms of dyspnea: Secondary | ICD-10-CM | POA: Diagnosis not present

## 2016-08-07 DIAGNOSIS — R079 Chest pain, unspecified: Secondary | ICD-10-CM

## 2016-08-07 DIAGNOSIS — R739 Hyperglycemia, unspecified: Secondary | ICD-10-CM | POA: Diagnosis not present

## 2016-08-07 DIAGNOSIS — R61 Generalized hyperhidrosis: Secondary | ICD-10-CM

## 2016-08-07 DIAGNOSIS — I10 Essential (primary) hypertension: Secondary | ICD-10-CM | POA: Diagnosis not present

## 2016-08-07 DIAGNOSIS — K219 Gastro-esophageal reflux disease without esophagitis: Secondary | ICD-10-CM | POA: Diagnosis not present

## 2016-08-07 DIAGNOSIS — Z736 Limitation of activities due to disability: Secondary | ICD-10-CM | POA: Diagnosis not present

## 2016-08-07 DIAGNOSIS — Z85528 Personal history of other malignant neoplasm of kidney: Secondary | ICD-10-CM

## 2016-08-07 DIAGNOSIS — G4733 Obstructive sleep apnea (adult) (pediatric): Secondary | ICD-10-CM | POA: Diagnosis not present

## 2016-08-07 DIAGNOSIS — R0600 Dyspnea, unspecified: Secondary | ICD-10-CM

## 2016-08-07 LAB — ECHOCARDIOGRAM COMPLETE
CHL CUP RV SYS PRESS: 23 mmHg
CHL CUP STROKE VOLUME: 53 mL
E/e' ratio: 6.02
EWDT: 173 ms
FS: 28 % (ref 28–44)
HEIGHTINCHES: 65 in
IVS/LV PW RATIO, ED: 0.98
LA ID, A-P, ES: 31 mm
LADIAMINDEX: 1.78 cm/m2
LAVOL: 50.2 mL
LAVOLA4C: 44.4 mL
LAVOLIN: 28.8 mL/m2
LEFT ATRIUM END SYS DIAM: 31 mm
LV E/e' medial: 6.02
LV E/e'average: 6.02
LV PW d: 9.77 mm — AB (ref 0.6–1.1)
LV SIMPSON'S DISK: 59
LV TDI E'MEDIAL: 8.7
LV dias vol: 89 mL (ref 62–150)
LV sys vol index: 21 mL/m2
LV sys vol: 36 mL (ref 21–61)
LVDIAVOLIN: 51 mL/m2
LVELAT: 12.5 cm/s
LVOT VTI: 24.3 cm
LVOT area: 2.84 cm2
LVOT diameter: 19 mm
LVOT peak grad rest: 6 mmHg
LVOTPV: 123 cm/s
LVOTSV: 69 mL
Lateral S' vel: 13.4 cm/s
MV Dec: 173
MV Peak grad: 2 mmHg
MV pk A vel: 71.6 m/s
MVPKEVEL: 75.3 m/s
Reg peak vel: 225 cm/s
TAPSE: 25.4 mm
TDI e' lateral: 12.5
TRMAXVEL: 225 cm/s
WEIGHTICAEL: 2315.2 [oz_av]

## 2016-08-07 LAB — RAPID URINE DRUG SCREEN, HOSP PERFORMED
Amphetamines: NOT DETECTED
Barbiturates: NOT DETECTED
Benzodiazepines: NOT DETECTED
COCAINE: NOT DETECTED
OPIATES: NOT DETECTED
Tetrahydrocannabinol: POSITIVE — AB

## 2016-08-07 LAB — BRAIN NATRIURETIC PEPTIDE: B Natriuretic Peptide: 30 pg/mL (ref 0.0–100.0)

## 2016-08-07 LAB — TROPONIN I

## 2016-08-07 NOTE — Progress Notes (Signed)
*  PRELIMINARY RESULTS* Echocardiogram 2D Echocardiogram has been performed.  Brandon Lawrence 08/07/2016, 3:11 PM
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
## 2016-08-07 NOTE — Care Management Note (Signed)
Case Management Note  Patient Details  Name: IZZAK FRIES MRN: [id number] Date of Birth: [birth year]  Subjective/Objective:                  Pt admitted with CP. Pt from home, lives with family and is ind with ADL's. Pt has PCP, transportation and insurance with drug coverage. Pt has no HH or DME needs PTA. Family at bedside. No needs communicated.   Action/Plan: Pt plans to return home with self care.   Expected Discharge Date:  08/07/16               Expected Discharge Plan:  Home/Self Care  In-House Referral:  NA  Discharge planning Services  CM Consult  Post Acute Care Choice:  NA Choice offered to:  NA  Status of Service:  Completed, signed off  Sherald Barge, RN 08/07/2016, 11:26 AM

## 2016-08-07 NOTE — Care Management Obs Status (Signed)
[facility] NOTIFICATION   Patient Details  Name: Brandon Lawrence MRN: [id number] Date of Birth: [birth year]   Medicare Observation Status Notification Given:  Yes    Sherald Barge, RN 08/07/2016, 11:26 AM
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
## 2016-08-08 ENCOUNTER — Encounter (HOSPITAL_COMMUNITY): Payer: Self-pay

## 2016-08-08 ENCOUNTER — Observation Stay (HOSPITAL_BASED_OUTPATIENT_CLINIC_OR_DEPARTMENT_OTHER): Payer: Medicare PPO

## 2016-08-08 DIAGNOSIS — R079 Chest pain, unspecified: Secondary | ICD-10-CM

## 2016-08-08 LAB — LIPID PANEL
Cholesterol: 131 mg/dL (ref 0–200)
HDL: 28 mg/dL — ABNORMAL LOW (ref >40)
LDL CALC: 87 mg/dL (ref 0–99)
Total CHOL/HDL Ratio: 4.7 RATIO
Triglycerides: 79 mg/dL (ref <150)
VLDL: 16 mg/dL (ref 0–40)

## 2016-08-08 LAB — NM MYOCAR MULTI W/SPECT W/WALL MOTION / EF
CHL CUP NUCLEAR SDS: 0
CHL CUP RESTING HR STRESS: 58 {beats}/min
LHR: 0.3
LV dias vol: 126 mL (ref 62–150)
LVSYSVOL: 61 mL
Peak HR: 93 {beats}/min
SRS: 1
SSS: 1
TID: 0.98

## 2016-08-08 LAB — HIV ANTIBODY (ROUTINE TESTING W REFLEX): HIV Screen 4th Generation wRfx: NONREACTIVE

## 2016-08-08 MED ORDER — TECHNETIUM TC 99M TETROFOSMIN IV KIT
30.0000 | PACK | Freq: Once | INTRAVENOUS | Status: AC | PRN
  Administered 2016-08-08: 30.2 via INTRAVENOUS

## 2016-08-08 MED ORDER — TECHNETIUM TC 99M TETROFOSMIN IV KIT
10.0000 | PACK | Freq: Once | INTRAVENOUS | Status: AC | PRN
  Administered 2016-08-08: 11 via INTRAVENOUS

## 2016-08-08 MED ORDER — SODIUM CHLORIDE 0.9% FLUSH
INTRAVENOUS | Status: AC
  Administered 2016-08-08: 10 mL via INTRAVENOUS
  Filled 2016-08-08: qty 10

## 2016-08-08 MED ORDER — REGADENOSON 0.4 MG/5ML IV SOLN
INTRAVENOUS | Status: AC
  Administered 2016-08-08: 0.4 mg via INTRAVENOUS
  Filled 2016-08-08: qty 5
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
## 2016-08-08 NOTE — Discharge Instructions (Signed)
Follow with Primary MD [name], MD in 7 days   Get CBC, CMP, 2 view Chest X ray checked  by Primary MD or SNF MD in 5-7 days ( we routinely change or add medications that can affect your baseline labs and fluid status, therefore we recommend that you get the mentioned basic workup next visit with your PCP, your PCP may decide not to get them or add new tests based on their clinical decision)  Activity: As tolerated with Full fall precautions use walker/cane & assistance as needed  Disposition Home    Diet: Heart Healthy    For Heart failure patients - Check your Weight same time everyday, if you gain over 2 pounds, or you develop in leg swelling, experience more shortness of breath or chest pain, call your Primary MD immediately. Follow Cardiac Low Salt Diet and 1.5 lit/day fluid restriction.  On your next visit with your primary care physician please Get Medicines reviewed and adjusted.  Please request your Prim.MD to go over all Hospital Tests and Procedure/Radiological results at the follow up, please get all Hospital records sent to your Prim MD by signing hospital release before you go home.  If you experience worsening of your admission symptoms, develop shortness of breath, life threatening emergency, suicidal or homicidal thoughts you must seek medical attention immediately by calling 911 or calling your MD immediately  if symptoms less severe.  You Must read complete instructions/literature along with all the possible adverse reactions/side effects for all the Medicines you take and that have been prescribed to you. Take any new Medicines after you have completely understood and accpet all the possible adverse reactions/side effects.   Do not drive, operate heavy machinery, perform activities at heights, swimming or participation in water activities or provide baby sitting services if your were admitted for syncope or siezures until you have seen by Primary MD or a Neurologist  and advised to do so again.  Do not drive when taking Pain medications.    Do not take more than prescribed Pain, Sleep and Anxiety Medications  Special Instructions: If you have smoked or chewed Tobacco  in the last 2 yrs please stop smoking, stop any regular Alcohol  and or any Recreational drug use.  Wear Seat belts while driving.   Please note  You were cared for by a hospitalist during your hospital stay. If you have any questions about your discharge medications or the care you received while you were in the hospital after you are discharged, you can call the unit and asked to speak with the hospitalist on call if the hospitalist that took care of you is not available. Once you are discharged, your primary care physician will handle any further medical issues. Please note that NO REFILLS for any discharge medications will be authorized once you are discharged, as it is imperative that you return to your primary care physician (or establish a relationship with a primary care physician if you do not have one) for your aftercare needs so that they can reassess your need for medications and monitor your lab values.

## 2016-08-08 NOTE — Progress Notes (Signed)
Discharge instructions read to patient.  Patient verbalized understanding of all instructions. Discharged to home with family. 
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
## 2016-08-09 ENCOUNTER — Telehealth: Payer: Self-pay | Admitting: Internal Medicine

## 2016-08-09 MED ORDER — LINACLOTIDE 72 MCG PO CAPS: 72.0000 ug | ORAL_CAPSULE | Freq: Every day | ORAL | 2 refills | Status: DC

## 2016-08-09 NOTE — Addendum Note (Signed)
Addended by: Gordy Levan, Uziel Covault A on: 08/09/2016 01:00 PM   Modules accepted: Orders

## 2016-08-09 NOTE — Telephone Encounter (Signed)
Please tell the patient his Rx was sent to the pharmacy as requested.
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
## 2016-08-09 NOTE — Telephone Encounter (Signed)
PATIENT STATED THAT HIS PHARMACY DID NOT RECEIVE THE PRESCRIPTION FOR Rolan Lipa

## 2016-08-12 ENCOUNTER — Ambulatory Visit (INDEPENDENT_AMBULATORY_CARE_PROVIDER_SITE_OTHER): Payer: Medicare PPO | Admitting: Family Medicine

## 2016-08-12 ENCOUNTER — Encounter: Payer: Self-pay | Admitting: Family Medicine

## 2016-08-12 VITALS — BP 126/68 | HR 72 | Temp 98.4°F | Resp 16 | Ht 65.0 in | Wt 141.0 lb

## 2016-08-12 DIAGNOSIS — R7309 Other abnormal glucose: Secondary | ICD-10-CM | POA: Diagnosis not present

## 2016-08-12 DIAGNOSIS — R0789 Other chest pain: Secondary | ICD-10-CM

## 2016-08-12 DIAGNOSIS — I10 Essential (primary) hypertension: Secondary | ICD-10-CM | POA: Diagnosis not present

## 2016-08-12 MED ORDER — MIRTAZAPINE 15 MG PO TABS: 7.5000 mg | ORAL_TABLET | Freq: Every day | ORAL | 2 refills | Status: DC

## 2016-08-12 NOTE — Progress Notes (Signed)
   Subjective:    Patient ID: Brandon Lawrence, male    DOB: [birth year], 56 y.o.   MRN: [id number]  Patient presents for Hospital F/U (chest pain) Patient here for hospital follow-up, he was seen by his gastroenterologist last week she didn't sit him associated he complained of chest pain nausea or diaphoresis. He had hospital. His EKG he had TTE and stress test which were benign. His pain was resolved by the time he left the hospital. Thought maybe this was exertional chest pain. He was stable on his gastrointestinal medications. He was not started on any new medications. He did have chest x-ray done which was normal in the emergency room discharge summary states repeat chest x-ray in one week though I see no abnormalities   his drug screen was positive for marijuana   lipid panel was unremarkable except for low HDL which is not new  He had fairly normal metabolic panel he did have elevated glucose unclear if this was fasting or not He had another twinge of pain but thought it was gas, none since      Review Of Systems:  GEN- denies fatigue, fever, weight loss,weakness, recent illness HEENT- denies eye drainage, change in vision, nasal discharge, CVS- denies chest pain, palpitations RESP- denies SOB, cough, wheeze ABD- denies N/V, change in stools, abd pain GU- denies dysuria, hematuria, dribbling, incontinence MSK- denies joint pain, muscle aches, injury Neuro- denies headache, dizziness, syncope, seizure activity       Objective:    BP 126/68   Pulse 72   Temp 98.4 F (36.9 C) (Oral)   Resp 16   Ht 5\' 5"  (1.651 m)   Wt 141 lb (64 kg)   SpO2 99%   BMI 23.46 kg/m  GEN- NAD, alert and oriented x3 HEENT- PERRL, EOMI, non injected sclera, pink conjunctiva, MMM, oropharynx clear CVS- RRR, no murmur RESP-CTAB ABD-NABS,soft,NT,ND EXT- No edema Pulses- Radial  2+        Assessment & Plan:      Problem List Items Addressed This Visit    Essential hypertension, benign -  Primary    Controlled. Reviewed hospital summary. No sign of cardiac pathology Lipids look okay He did have elevated glucose, check A1C and repeat renal function Discussed stopping marijiana use. COntinue GI medications  XRAY was normal, no reason to repeat at this time      Relevant Orders   Hemoglobin A1c   Atypical chest pain    Other Visit Diagnoses    Elevated glucose       Relevant Orders   Hemoglobin A1c   Comprehensive metabolic panel      Note: This dictation was prepared with Dragon dictation along with smaller phrase technology. Any transcriptional errors that result from this process are unintentional.

## 2016-08-12 NOTE — Patient Instructions (Addendum)
F/U 4 months PHysical  We will call with lab results

## 2016-08-12 NOTE — Telephone Encounter (Signed)
Called and spoke with Ms.Gaughan, she said she thought he picked it up. If he didn't she will have him call.

## 2016-08-12 NOTE — Assessment & Plan Note (Signed)
Controlled. Reviewed hospital summary. No sign of cardiac pathology Lipids look okay He did have elevated glucose, check A1C and repeat renal function Discussed stopping marijiana use. COntinue GI medications  XRAY was normal, no reason to repeat at this time

## 2016-08-13 LAB — COMPREHENSIVE METABOLIC PANEL
ALK PHOS: 40 U/L (ref 40–115)
ALT: 13 U/L (ref 9–46)
AST: 14 U/L (ref 10–35)
Albumin: 3.8 g/dL (ref 3.6–5.1)
BUN: 14 mg/dL (ref 7–25)
CALCIUM: 8.9 mg/dL (ref 8.6–10.3)
CO2: 28 mmol/L (ref 20–31)
Chloride: 104 mmol/L (ref 98–110)
Creat: 1.3 mg/dL (ref 0.70–1.33)
Glucose, Bld: 82 mg/dL (ref 70–99)
POTASSIUM: 4.4 mmol/L (ref 3.5–5.3)
Sodium: 139 mmol/L (ref 135–146)
Total Bilirubin: 0.3 mg/dL (ref 0.2–1.2)
Total Protein: 6.6 g/dL (ref 6.1–8.1)

## 2016-08-13 LAB — HEMOGLOBIN A1C
Hgb A1c MFr Bld: 5.4 % (ref <5.7)
MEAN PLASMA GLUCOSE: 108 mg/dL

## 2016-09-09 ENCOUNTER — Encounter: Payer: Self-pay | Admitting: Family Medicine

## 2016-09-19 ENCOUNTER — Encounter: Payer: Self-pay | Admitting: Nurse Practitioner

## 2016-09-19 ENCOUNTER — Ambulatory Visit (INDEPENDENT_AMBULATORY_CARE_PROVIDER_SITE_OTHER): Payer: Medicare PPO | Admitting: Nurse Practitioner

## 2016-09-19 VITALS — BP 155/77 | HR 64 | Temp 97.6°F | Ht 64.0 in | Wt 143.2 lb

## 2016-09-19 DIAGNOSIS — K219 Gastro-esophageal reflux disease without esophagitis: Secondary | ICD-10-CM

## 2016-09-19 DIAGNOSIS — K5909 Other constipation: Secondary | ICD-10-CM | POA: Diagnosis not present

## 2016-09-19 DIAGNOSIS — R112 Nausea with vomiting, unspecified: Secondary | ICD-10-CM | POA: Diagnosis not present

## 2016-09-19 MED ORDER — DEXLANSOPRAZOLE 60 MG PO CPDR: 60.0000 mg | DELAYED_RELEASE_CAPSULE | Freq: Every day | ORAL | 5 refills | Status: DC

## 2016-09-19 NOTE — Assessment & Plan Note (Signed)
His GERD symptoms improved on Dexilant but have now worsened again. This is because he confused his instructions and rather than add Zantac in the evenings he stopped taking Dexilant and substituted for Zantac. I have re-discussed recommendations with him. He states he fully understands now. He is to take Dexilant once a day, Zantac 150 milligrams in the evenings. Return for follow-up in 3 months for clinical progress.
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
## 2016-09-19 NOTE — Assessment & Plan Note (Signed)
Nausea and vomiting significantly improved. Occasionally does have symptoms but not nearly as bad as previous. This is likely due to his, albeit temporary, improvement in GERD control. Further GERD management as per below. Return for follow-up in 3 months.

## 2016-09-19 NOTE — Patient Instructions (Signed)
1. Complete the stool tests and bring him back to our office. 2. Take Dexilant once a day, every day. 3. Takes Zantac 150 mg in the evenings. 4. Return for follow-up in 3 months.

## 2016-09-19 NOTE — Assessment & Plan Note (Signed)
Constipation and abdominal pain consistent with IBS constipation type. His pain resolves after a bowel movement. He is on Linzess daily and has daily bowel movements. He notes a black tarry stool which is been ongoing for some time. I doubt this is acute in nature and likely dietary related. I will check an I follow-up today to be sure that there is no heme positive stool. Regardless his last endoscopy and colonoscopy are up-to-date completed last year. Return for follow-up in 3 months.

## 2016-09-23 ENCOUNTER — Ambulatory Visit (INDEPENDENT_AMBULATORY_CARE_PROVIDER_SITE_OTHER): Payer: Medicare PPO

## 2016-09-23 DIAGNOSIS — K5900 Constipation, unspecified: Secondary | ICD-10-CM | POA: Diagnosis not present

## 2016-09-23 LAB — IFOBT (OCCULT BLOOD): IFOBT: POSITIVE

## 2016-09-23 NOTE — Progress Notes (Signed)
cc'ed to pcp °

## 2016-09-23 NOTE — Progress Notes (Signed)
Pt returned IFOBT test and it was POSITIVE 

## 2016-10-08 NOTE — Progress Notes (Signed)
MOVED TO SOONER APPT AND JULIE IS ATTEMPTING TO CONTACT THE PATIENT

## 2016-10-09 ENCOUNTER — Other Ambulatory Visit: Payer: Self-pay | Admitting: Nurse Practitioner

## 2016-10-09 ENCOUNTER — Other Ambulatory Visit: Payer: Self-pay

## 2016-10-09 ENCOUNTER — Encounter: Payer: Self-pay | Admitting: Nurse Practitioner

## 2016-10-09 ENCOUNTER — Telehealth: Payer: Self-pay

## 2016-10-09 ENCOUNTER — Ambulatory Visit (INDEPENDENT_AMBULATORY_CARE_PROVIDER_SITE_OTHER): Payer: Medicare PPO | Admitting: Nurse Practitioner

## 2016-10-09 VITALS — BP 119/64 | HR 61 | Temp 98.5°F | Ht 66.0 in | Wt 141.4 lb

## 2016-10-09 DIAGNOSIS — K5909 Other constipation: Secondary | ICD-10-CM | POA: Diagnosis not present

## 2016-10-09 DIAGNOSIS — R195 Other fecal abnormalities: Secondary | ICD-10-CM

## 2016-10-09 DIAGNOSIS — R1310 Dysphagia, unspecified: Secondary | ICD-10-CM | POA: Diagnosis not present

## 2016-10-09 DIAGNOSIS — K219 Gastro-esophageal reflux disease without esophagitis: Secondary | ICD-10-CM | POA: Diagnosis not present

## 2016-10-09 LAB — COMPREHENSIVE METABOLIC PANEL
ALT: 13 U/L (ref 9–46)
AST: 14 U/L (ref 10–35)
Albumin: 3.6 g/dL (ref 3.6–5.1)
Alkaline Phosphatase: 38 U/L — ABNORMAL LOW (ref 40–115)
BUN: 16 mg/dL (ref 7–25)
CO2: 26 mmol/L (ref 20–31)
Calcium: 8.2 mg/dL — ABNORMAL LOW (ref 8.6–10.3)
Chloride: 108 mmol/L (ref 98–110)
Creat: 1.02 mg/dL (ref 0.70–1.33)
GLUCOSE: 167 mg/dL — AB (ref 65–99)
POTASSIUM: 3.8 mmol/L (ref 3.5–5.3)
Sodium: 139 mmol/L (ref 135–146)
Total Bilirubin: 0.3 mg/dL (ref 0.2–1.2)
Total Protein: 6.2 g/dL (ref 6.1–8.1)

## 2016-10-09 LAB — CBC WITH DIFFERENTIAL/PLATELET
Basophils Absolute: 0 cells/uL (ref 0–200)
Basophils Relative: 0 %
EOS ABS: 400 {cells}/uL (ref 15–500)
Eosinophils Relative: 5 %
HCT: 34.7 % — ABNORMAL LOW (ref 38.5–50.0)
Hemoglobin: 11.6 g/dL — ABNORMAL LOW (ref 13.2–17.1)
LYMPHS PCT: 41 %
Lymphs Abs: 3280 cells/uL (ref 850–3900)
MCH: 31.5 pg (ref 27.0–33.0)
MCHC: 33.4 g/dL (ref 32.0–36.0)
MCV: 94.3 fL (ref 80.0–100.0)
MONOS PCT: 8 %
MPV: 9.8 fL (ref 7.5–12.5)
Monocytes Absolute: 640 cells/uL (ref 200–950)
Neutro Abs: 3680 cells/uL (ref 1500–7800)
Neutrophils Relative %: 46 %
PLATELETS: 180 10*3/uL (ref 140–400)
RBC: 3.68 MIL/uL — ABNORMAL LOW (ref 4.20–5.80)
RDW: 15.4 % — AB (ref 11.0–15.0)
WBC: 8 10*3/uL (ref 3.8–10.8)

## 2016-10-09 MED ORDER — SUCRALFATE 1 GM/10ML PO SUSP: 1.0000 g | Freq: Four times a day (QID) | ORAL | 1 refills | Status: DC | PRN

## 2016-10-09 NOTE — Assessment & Plan Note (Signed)
Constipation is significantly improved but does still have straining on 3-4 out of 10 bowel movements. I'll check labs, continue current medications. Heme positive stool. This could be because of straining and hemorrhoid symptoms although no hemorrhoids were noted on his colonoscopy last year. Further evaluation as per below.

## 2016-10-09 NOTE — Telephone Encounter (Signed)
Called and informed pt of pre-op appt 11/14/16 at 11:00am. Letter mailed.

## 2016-10-09 NOTE — Telephone Encounter (Signed)
pts insurance would not pay for carafate suspension. I tried to do a PA and it was denied. Called pharmacy, carafate pills are covered. Per EG ok to use tablets, pt should crush and add to small amount of water qid prn. #90 2 RF. Pharmacist is aware.

## 2016-10-09 NOTE — Assessment & Plan Note (Signed)
The patient is having new onset dysphagia symptoms. This happens at least once a week and frequently causes regurgitation. He also started having worsening of his upper reflux symptoms. Additionally, describes "black tarry stools" occasionally. He did use have an upper endoscopy 1 year ago as well as a colonoscopy, but given heme positive stool, new onset dysphagia, worsening GERD symptoms I feel it is prudent to repeat an upper endoscopy to further evaluate. If there is no source for his heme positive stool found, we can consider capsule endoscopy versus repeat colonoscopy to wrap up exam. Otherwise, continue medications, I will check a CBC for any significant blood loss as well as CMP. I will send in Carafate to the pharmacy to help with breakthrough GERD symptoms as he is already on Dexilant once a day and Zantac in the evenings. Return for follow-up in 6 weeks. We'll further discuss with Dr. Gala Romney for any further recommendations.  Proceed with EGD with Dr. Gala Romney in near future: the risks, benefits, and alternatives have been discussed with the patient in detail. The patient states understanding and desires to proceed.  Patient has a history of chronic alcohol use, currently uses marijuana once a day. I've advised him to stop doing this for the time being at least. He will likely urine drug screen. We will plan for propofol/MAC due to his history.

## 2016-10-09 NOTE — Telephone Encounter (Signed)
EG advised for pt to have urine drug screen done at pre-op. Order entered.
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
## 2016-10-09 NOTE — Patient Instructions (Signed)
1. Have your labs drawn when you're able to. 2. Continue your current medications. 3. I'm sending Carafate to your pharmacy that she can take up to 4 times a day as needed for breakthrough heartburn symptoms. 4. We will schedule your upper endoscopy for you. 5. Return for follow-up in 6 weeks.

## 2016-10-09 NOTE — Progress Notes (Signed)
CC'ED TO PCP 

## 2016-10-09 NOTE — Assessment & Plan Note (Signed)
Worsening GERD symptoms. His currently on Dexilant daily and Zantac 150 in the evenings. I will add Carafate for breakthrough symptoms. Upper endoscopy as per below. Return for follow-up in 6 weeks.

## 2016-10-09 NOTE — Telephone Encounter (Signed)
Noted, no further recommendations at this time. 

## 2016-11-04 NOTE — Patient Instructions (Signed)
Brandon Lawrence  11/04/2016     @PREFPERIOPPHARMACY @   Your procedure is scheduled on  11/18/2016   Report to [facility] at  67  A.M.  Call this number if you have problems the morning of surgery:  [phone]   Remember:  Do not eat food or drink liquids after midnight.  Take these medicines the morning of surgery with A SIP OF WATER  Dexilant, cardura, cymbalta, ultram.   Do not wear jewelry, make-up or nail polish.  Do not wear lotions, powders, or perfumes, or deoderant.  Do not shave 48 hours prior to surgery.  Men may shave face and neck.  Do not bring valuables to the hospital.  [facility] is not responsible for any belongings or valuables.  Contacts, dentures or bridgework may not be worn into surgery.  Leave your suitcase in the car.  After surgery it may be brought to your room.  For patients admitted to the hospital, discharge time will be determined by your treatment team.  Patients discharged the day of surgery will not be allowed to drive home.   Name and phone number of your driver:   family Special instructions:  Follow the diet instructions given to you by Dr Roseanne Kaufman office.  Please read over the following fact sheets that you were given. Anesthesia Post-op Instructions and Care and Recovery After Surgery       Esophagogastroduodenoscopy Esophagogastroduodenoscopy (EGD) is a procedure to examine the lining of the esophagus, stomach, and first part of the small intestine (duodenum). This procedure is done to check for problems such as inflammation, bleeding, ulcers, or growths. During this procedure, a long, flexible, lighted tube with a camera attached (endoscope) is inserted down the throat. Tell a health care provider about:  Any allergies you have.  All medicines you are taking, including vitamins, herbs, eye drops, creams, and over-the-counter medicines.  Any problems you or family members have had with anesthetic  medicines.  Any blood disorders you have.  Any surgeries you have had.  Any medical conditions you have.  Whether you are pregnant or may be pregnant. What are the risks? Generally, this is a safe procedure. However, problems may occur, including:  Infection.  Bleeding.  A tear (perforation) in the esophagus, stomach, or duodenum.  Trouble breathing.  Excessive sweating.  Spasms of the larynx.  A slowed heartbeat.  Low blood pressure.  What happens before the procedure?  Follow instructions from your health care provider about eating or drinking restrictions.  Ask your health care provider about: ? Changing or stopping your regular medicines. This is especially important if you are taking diabetes medicines or blood thinners. ? Taking medicines such as aspirin and ibuprofen. These medicines can thin your blood. Do not take these medicines before your procedure if your health care provider instructs you not to.  Plan to have someone take you home after the procedure.  If you wear dentures, be ready to remove them before the procedure. What happens during the procedure?  To reduce your risk of infection, your health care team will wash or sanitize their hands.  An IV tube will be put in a vein in your hand or arm. You will get medicines and fluids through this tube.  You will be given one or more of the following: ? A medicine to help you relax (sedative). ? A medicine to numb the area (local anesthetic). This medicine  may be sprayed into your throat. It will make you feel more comfortable and keep you from gagging or coughing during the procedure. ? A medicine for pain.  A mouth guard may be placed in your mouth to protect your teeth and to keep you from biting on the endoscope.  You will be asked to lie on your left side.  The endoscope will be lowered down your throat into your esophagus, stomach, and duodenum.  Air will be put into the endoscope. This will  help your health care provider see better.  The lining of your esophagus, stomach, and duodenum will be examined.  Your health care provider may: ? Take a tissue sample so it can be looked at in a lab (biopsy). ? Remove growths. ? Remove objects (foreign bodies) that are stuck. ? Treat any bleeding with medicines or other devices that stop tissue from bleeding. ? Widen (dilate) or stretch narrowed areas of your esophagus and stomach.  The endoscope will be taken out. The procedure may vary among health care providers and hospitals. What happens after the procedure?  Your blood pressure, heart rate, breathing rate, and blood oxygen level will be monitored often until the medicines you were given have worn off.  Do not eat or drink anything until the numbing medicine has worn off and your gag reflex has returned. This information is not intended to replace advice given to you by your health care provider. Make sure you discuss any questions you have with your health care provider. Document Released: 08/23/2004 Document Revised: 09/28/2015 Document Reviewed: 03/16/2015 Elsevier Interactive Patient Education  2018 [employer]. Esophagogastroduodenoscopy, Care After Refer to this sheet in the next few weeks. These instructions provide you with information about caring for yourself after your procedure. Your health care provider may also give you more specific instructions. Your treatment has been planned according to current medical practices, but problems sometimes occur. Call your health care provider if you have any problems or questions after your procedure. What can I expect after the procedure? After the procedure, it is common to have:  A sore throat.  Nausea.  Bloating.  Dizziness.  Fatigue.  Follow these instructions at home:  Do not eat or drink anything until the numbing medicine (local anesthetic) has worn off and your gag reflex has returned. You will know that the  local anesthetic has worn off when you can swallow comfortably.  Do not drive for 24 hours if you received a medicine to help you relax (sedative).  If your health care provider took a tissue sample for testing during the procedure, make sure to get your test results. This is your responsibility. Ask your health care provider or the department performing the test when your results will be ready.  Keep all follow-up visits as told by your health care provider. This is important. Contact a health care provider if:  You cannot stop coughing.  You are not urinating.  You are urinating less than usual. Get help right away if:  You have trouble swallowing.  You cannot eat or drink.  You have throat or chest pain that gets worse.  You are dizzy or light-headed.  You faint.  You have nausea or vomiting.  You have chills.  You have a fever.  You have severe abdominal pain.  You have black, tarry, or bloody stools. This information is not intended to replace advice given to you by your health care provider. Make sure you discuss any questions you have  with your health care provider. Document Released: 04/08/2012 Document Revised: 09/28/2015 Document Reviewed: 03/16/2015 Elsevier Interactive Patient Education  2018 [employer].  Esophageal Dilatation Esophageal dilatation is a procedure to open a blocked or narrowed part of the esophagus. The esophagus is the long tube in your throat that carries food and liquid from your mouth to your stomach. The procedure is also called esophageal dilation. You may need this procedure if you have a buildup of scar tissue in your esophagus that makes it difficult, painful, or even impossible to swallow. This can be caused by gastroesophageal reflux disease (GERD). In rare cases, people need this procedure because they have cancer of the esophagus or a problem with the way food moves through the esophagus. Sometimes you may need to have another  dilatation to enlarge the opening of the esophagus gradually. Tell a health care provider about:  Any allergies you have.  All medicines you are taking, including vitamins, herbs, eye drops, creams, and over-the-counter medicines.  Any problems you or family members have had with anesthetic medicines.  Any blood disorders you have.  Any surgeries you have had.  Any medical conditions you have.  Any antibiotic medicines you are required to take before dental procedures. What are the risks? Generally, this is a safe procedure. However, problems can occur and include:  Bleeding from a tear in the lining of the esophagus.  A hole (perforation) in the esophagus.  What happens before the procedure?  Do not eat or drink anything after midnight on the night before the procedure or as directed by your health care provider.  Ask your health care provider about changing or stopping your regular medicines. This is especially important if you are taking diabetes medicines or blood thinners.  Plan to have someone take you home after the procedure. What happens during the procedure?  You will be given a medicine that makes you relaxed and sleepy (sedative).  A medicine may be sprayed or gargled to numb the back of the throat.  Your health care provider can use various instruments to do an esophageal dilatation. During the procedure, the instrument used will be placed in your mouth and passed down into your esophagus. Options include: ? Simple dilators. This instrument is carefully placed in the esophagus to stretch it. ? Guided wire bougies. In this method, a flexible tube (endoscope) is used to insert a wire into the esophagus. The dilator is passed over this wire to enlarge the esophagus. Then the wire is removed. ? Balloon dilators. An endoscope with a small balloon at the end is passed down into the esophagus. Inflating the balloon gently stretches the esophagus and opens it up. What  happens after the procedure?  Your blood pressure, heart rate, breathing rate, and blood oxygen level will be monitored often until the medicines you were given have worn off.  Your throat may feel slightly sore and will probably still feel numb. This will improve slowly over time.  You will not be allowed to eat or drink until the throat numbness has resolved.  If this is a same-day procedure, you may be allowed to go home once you have been able to drink, urinate, and sit on the edge of the bed without nausea or dizziness.  If this is a same-day procedure, you should have a friend or family member with you for the next 24 hours after the procedure. This information is not intended to replace advice given to you by your health care provider.  Make sure you discuss any questions you have with your health care provider. Document Released: 06/13/2005 Document Revised: 09/28/2015 Document Reviewed: 09/01/2013 Elsevier Interactive Patient Education  2017 [place] Anesthesia is a term that refers to techniques, procedures, and medicines that help a person stay safe and comfortable during a medical procedure. Monitored anesthesia care, or sedation, is one type of anesthesia. Your anesthesia specialist may recommend sedation if you will be having a procedure that does not require you to be unconscious, such as:  Cataract surgery.  A dental procedure.  A biopsy.  A colonoscopy.  During the procedure, you may receive a medicine to help you relax (sedative). There are three levels of sedation:  Mild sedation. At this level, you may feel awake and relaxed. You will be able to follow directions.  Moderate sedation. At this level, you will be sleepy. You may not remember the procedure.  Deep sedation. At this level, you will be asleep. You will not remember the procedure.  The more medicine you are given, the deeper your level of sedation will be. Depending on how  you respond to the procedure, the anesthesia specialist may change your level of sedation or the type of anesthesia to fit your needs. An anesthesia specialist will monitor you closely during the procedure. Let your health care provider know about:  Any allergies you have.  All medicines you are taking, including vitamins, herbs, eye drops, creams, and over-the-counter medicines.  Any use of steroids (by mouth or as a cream).  Any problems you or family members have had with sedatives and anesthetic medicines.  Any blood disorders you have.  Any surgeries you have had.  Any medical conditions you have, such as sleep apnea.  Whether you are pregnant or may be pregnant.  Any use of cigarettes, alcohol, or street drugs. What are the risks? Generally, this is a safe procedure. However, problems may occur, including:  Getting too much medicine (oversedation).  Nausea.  Allergic reaction to medicines.  Trouble breathing. If this happens, a breathing tube may be used to help with breathing. It will be removed when you are awake and breathing on your own.  Heart trouble.  Lung trouble.  Before the procedure Staying hydrated Follow instructions from your health care provider about hydration, which may include:  Up to 2 hours before the procedure - you may continue to drink clear liquids, such as water, clear fruit juice, black coffee, and plain tea.  Eating and drinking restrictions Follow instructions from your health care provider about eating and drinking, which may include:  8 hours before the procedure - stop eating heavy meals or foods such as meat, fried foods, or fatty foods.  6 hours before the procedure - stop eating light meals or foods, such as toast or cereal.  6 hours before the procedure - stop drinking milk or drinks that contain milk.  2 hours before the procedure - stop drinking clear liquids.  Medicines Ask your health care provider about:  Changing or  stopping your regular medicines. This is especially important if you are taking diabetes medicines or blood thinners.  Taking medicines such as aspirin and ibuprofen. These medicines can thin your blood. Do not take these medicines before your procedure if your health care provider instructs you not to.  Tests and exams  You will have a physical exam.  You may have blood tests done to show: ? How well your kidneys and liver are working. ?  How well your blood can clot.  General instructions  Plan to have someone take you home from the hospital or clinic.  If you will be going home right after the procedure, plan to have someone with you for 24 hours.  What happens during the procedure?  Your blood pressure, heart rate, breathing, level of pain and overall condition will be monitored.  An IV tube will be inserted into one of your veins.  Your anesthesia specialist will give you medicines as needed to keep you comfortable during the procedure. This may mean changing the level of sedation.  The procedure will be performed. After the procedure  Your blood pressure, heart rate, breathing rate, and blood oxygen level will be monitored until the medicines you were given have worn off.  Do not drive for 24 hours if you received a sedative.  You may: ? Feel sleepy, clumsy, or nauseous. ? Feel forgetful about what happened after the procedure. ? Have a sore throat if you had a breathing tube during the procedure. ? Vomit. This information is not intended to replace advice given to you by your health care provider. Make sure you discuss any questions you have with your health care provider. Document Released: 01/16/2005 Document Revised: 09/29/2015 Document Reviewed: 08/13/2015 Elsevier Interactive Patient Education  2018 [employer], Care After These instructions provide you with information about caring for yourself after your procedure. Your health care  provider may also give you more specific instructions. Your treatment has been planned according to current medical practices, but problems sometimes occur. Call your health care provider if you have any problems or questions after your procedure. What can I expect after the procedure? After your procedure, it is common to:  Feel sleepy for several hours.  Feel clumsy and have poor balance for several hours.  Feel forgetful about what happened after the procedure.  Have poor judgment for several hours.  Feel nauseous or vomit.  Have a sore throat if you had a breathing tube during the procedure.  Follow these instructions at home: For at least 24 hours after the procedure:   Do not: ? Participate in activities in which you could fall or become injured. ? Drive. ? Use heavy machinery. ? Drink alcohol. ? Take sleeping pills or medicines that cause drowsiness. ? Make important decisions or sign legal documents. ? Take care of children on your own.  Rest. Eating and drinking  Follow the diet that is recommended by your health care provider.  If you vomit, drink water, juice, or soup when you can drink without vomiting.  Make sure you have little or no nausea before eating solid foods. General instructions  Have a responsible adult stay with you until you are awake and alert.  Take over-the-counter and prescription medicines only as told by your health care provider.  If you smoke, do not smoke without supervision.  Keep all follow-up visits as told by your health care provider. This is important. Contact a health care provider if:  You keep feeling nauseous or you keep vomiting.  You feel light-headed.  You develop a rash.  You have a fever. Get help right away if:  You have trouble breathing. This information is not intended to replace advice given to you by your health care provider. Make sure you discuss any questions you have with your health care  provider. Document Released: 08/13/2015 Document Revised: 12/13/2015 Document Reviewed: 08/13/2015 Elsevier Interactive Patient Education  [employer].

## 2016-11-14 ENCOUNTER — Encounter (HOSPITAL_COMMUNITY)
Admission: RE | Admit: 2016-11-14 | Discharge: 2016-11-14 | Disposition: A | Discharge status: Home / Self Care | Payer: Medicare PPO | Source: Ambulatory Visit | Attending: Internal Medicine | Admitting: Internal Medicine

## 2016-11-14 ENCOUNTER — Encounter (HOSPITAL_COMMUNITY): Payer: Self-pay

## 2016-11-18 ENCOUNTER — Encounter (HOSPITAL_COMMUNITY)
Admission: RE | Disposition: A | Discharge status: Home / Self Care | Payer: Self-pay | Source: Ambulatory Visit | Attending: Internal Medicine

## 2016-11-18 ENCOUNTER — Ambulatory Visit (HOSPITAL_COMMUNITY): Payer: Medicare PPO | Admitting: Anesthesiology

## 2016-11-18 ENCOUNTER — Encounter (HOSPITAL_COMMUNITY): Payer: Self-pay

## 2016-11-18 ENCOUNTER — Ambulatory Visit (HOSPITAL_COMMUNITY)
Admission: RE | Admit: 2016-11-18 | Discharge: 2016-11-18 | Disposition: A | Discharge status: Home / Self Care | Payer: Medicare PPO | Source: Ambulatory Visit | Attending: Internal Medicine | Admitting: Internal Medicine

## 2016-11-18 DIAGNOSIS — I10 Essential (primary) hypertension: Secondary | ICD-10-CM | POA: Insufficient documentation

## 2016-11-18 DIAGNOSIS — Z87891 Personal history of nicotine dependence: Secondary | ICD-10-CM | POA: Diagnosis not present

## 2016-11-18 DIAGNOSIS — G4733 Obstructive sleep apnea (adult) (pediatric): Secondary | ICD-10-CM | POA: Diagnosis not present

## 2016-11-18 DIAGNOSIS — K3189 Other diseases of stomach and duodenum: Secondary | ICD-10-CM | POA: Diagnosis not present

## 2016-11-18 DIAGNOSIS — K219 Gastro-esophageal reflux disease without esophagitis: Secondary | ICD-10-CM | POA: Diagnosis not present

## 2016-11-18 DIAGNOSIS — M1990 Unspecified osteoarthritis, unspecified site: Secondary | ICD-10-CM | POA: Insufficient documentation

## 2016-11-18 DIAGNOSIS — R1310 Dysphagia, unspecified: Secondary | ICD-10-CM | POA: Insufficient documentation

## 2016-11-18 DIAGNOSIS — Z85528 Personal history of other malignant neoplasm of kidney: Secondary | ICD-10-CM | POA: Diagnosis not present

## 2016-11-18 DIAGNOSIS — R12 Heartburn: Secondary | ICD-10-CM

## 2016-11-18 DIAGNOSIS — F329 Major depressive disorder, single episode, unspecified: Secondary | ICD-10-CM | POA: Insufficient documentation

## 2016-11-18 DIAGNOSIS — R195 Other fecal abnormalities: Secondary | ICD-10-CM

## 2016-11-18 DIAGNOSIS — Z86010 Personal history of colonic polyps: Secondary | ICD-10-CM | POA: Diagnosis not present

## 2016-11-18 HISTORY — PX: ESOPHAGOGASTRODUODENOSCOPY (EGD) WITH PROPOFOL: SHX5813

## 2016-11-18 HISTORY — PX: MALONEY DILATION: SHX5535

## 2016-11-18 HISTORY — PX: BIOPSY: SHX5522

## 2016-11-18 LAB — RAPID URINE DRUG SCREEN, HOSP PERFORMED
AMPHETAMINES: NOT DETECTED
BENZODIAZEPINES: NOT DETECTED
Barbiturates: NOT DETECTED
Cocaine: NOT DETECTED
OPIATES: NOT DETECTED
Tetrahydrocannabinol: POSITIVE — AB

## 2016-11-18 SURGERY — ESOPHAGOGASTRODUODENOSCOPY (EGD) WITH PROPOFOL
Anesthesia: Monitor Anesthesia Care

## 2016-11-18 MED ORDER — LIDOCAINE HCL (CARDIAC) 10 MG/ML IV SOLN
INTRAVENOUS | Status: DC | PRN
  Administered 2016-11-18: 30 mg via INTRAVENOUS

## 2016-11-18 MED ORDER — MIDAZOLAM HCL 2 MG/2ML IJ SOLN
INTRAMUSCULAR | Status: AC
  Filled 2016-11-18: qty 2

## 2016-11-18 MED ORDER — CHLORHEXIDINE GLUCONATE CLOTH 2 % EX PADS: 6.0000 | MEDICATED_PAD | Freq: Once | CUTANEOUS | Status: DC

## 2016-11-18 MED ORDER — PROPOFOL 10 MG/ML IV BOLUS
INTRAVENOUS | Status: AC
  Filled 2016-11-18: qty 40

## 2016-11-18 MED ORDER — LIDOCAINE VISCOUS 2 % MT SOLN
15.0000 mL | Freq: Once | OROMUCOSAL | Status: AC
  Administered 2016-11-18: 15 mL via OROMUCOSAL

## 2016-11-18 MED ORDER — LIDOCAINE HCL (PF) 1 % IJ SOLN
INTRAMUSCULAR | Status: AC
  Filled 2016-11-18: qty 5

## 2016-11-18 MED ORDER — STERILE WATER FOR IRRIGATION IR SOLN
Status: DC | PRN
  Administered 2016-11-18: 100 mL

## 2016-11-18 MED ORDER — FENTANYL CITRATE (PF) 100 MCG/2ML IJ SOLN
INTRAMUSCULAR | Status: AC
  Filled 2016-11-18: qty 2

## 2016-11-18 MED ORDER — GLYCOPYRROLATE 0.2 MG/ML IJ SOLN
INTRAMUSCULAR | Status: AC
  Filled 2016-11-18: qty 1

## 2016-11-18 MED ORDER — FENTANYL CITRATE (PF) 100 MCG/2ML IJ SOLN
25.0000 ug | Freq: Once | INTRAMUSCULAR | Status: AC
  Administered 2016-11-18: 25 ug via INTRAVENOUS

## 2016-11-18 MED ORDER — LIDOCAINE VISCOUS 2 % MT SOLN
OROMUCOSAL | Status: AC
  Filled 2016-11-18: qty 15

## 2016-11-18 MED ORDER — PROPOFOL 500 MG/50ML IV EMUL
INTRAVENOUS | Status: DC | PRN
  Administered 2016-11-18: 150 ug/kg/min via INTRAVENOUS

## 2016-11-18 MED ORDER — LACTATED RINGERS IV SOLN
INTRAVENOUS | Status: DC
  Administered 2016-11-18 (×2): via INTRAVENOUS

## 2016-11-18 MED ORDER — MIDAZOLAM HCL 2 MG/2ML IJ SOLN
1.0000 mg | INTRAMUSCULAR | Status: AC
  Administered 2016-11-18: 2 mg via INTRAVENOUS

## 2016-11-18 NOTE — Discharge Instructions (Addendum)
Food Choices for Gastroesophageal Reflux Disease, Adult When you have gastroesophageal reflux disease (GERD), the foods you eat and your eating habits are very important. Choosing the right foods can help ease your discomfort. What guidelines do I need to follow?  Choose fruits, vegetables, whole grains, and low-fat dairy products.  Choose low-fat meat, fish, and poultry.  Limit fats such as oils, salad dressings, butter, nuts, and avocado.  Keep a food diary. This helps you identify foods that cause symptoms.  Avoid foods that cause symptoms. These may be different for everyone.  Eat small meals often instead of 3 large meals a day.  Eat your meals slowly, in a place where you are relaxed.  Limit fried foods.  Cook foods using methods other than frying.  Avoid drinking alcohol.  Avoid drinking large amounts of liquids with your meals.  Avoid bending over or lying down until 2-3 hours after eating. What foods are not recommended? These are some foods and drinks that may make your symptoms worse: Vegetables Tomatoes. Tomato juice. Tomato and spaghetti sauce. Chili peppers. Onion and garlic. Horseradish. Fruits Oranges, grapefruit, and lemon (fruit and juice). Meats High-fat meats, fish, and poultry. This includes hot dogs, ribs, ham, sausage, salami, and bacon. Dairy Whole milk and chocolate milk. Sour cream. Cream. Butter. Ice cream. Cream cheese. Drinks Coffee and tea. Bubbly (carbonated) drinks or energy drinks. Condiments Hot sauce. Barbecue sauce. Sweets/Desserts Chocolate and cocoa. Donuts. Peppermint and spearmint. Fats and Oils High-fat foods. This includes [place] fries and potato chips. Other Vinegar. Strong spices. This includes black pepper, white pepper, red pepper, cayenne, curry powder, cloves, ginger, and chili powder. The items listed above may not be a complete list of foods and drinks to avoid. Contact your dietitian for more information. This  information is not intended to replace advice given to you by your health care provider. Make sure you discuss any questions you have with your health care provider. Document Released: 10/22/2011 Document Revised: 09/28/2015 Document Reviewed: 02/24/2013 Elsevier Interactive Patient Education  2017 [place]. EGD Discharge instructions Please read the instructions outlined below and refer to this sheet in the next few weeks. These discharge instructions provide you with general information on caring for yourself after you leave the hospital. Your doctor may also give you specific instructions. While your treatment has been planned according to the most current medical practices available, unavoidable complications occasionally occur. If you have any problems or questions after discharge, please call your doctor. ACTIVITY  You may resume your regular activity but move at a slower pace for the next 24 hours.   Take frequent rest periods for the next 24 hours.   Walking will help expel (get rid of) the air and reduce the bloated feeling in your abdomen.   No driving for 24 hours (because of the anesthesia (medicine) used during the test).   You may shower.   Do not sign any important legal documents or operate any machinery for 24 hours (because of the anesthesia used during the test).  NUTRITION  Drink plenty of fluids.   You may resume your normal diet.   Begin with a light meal and progress to your normal diet.   Avoid alcoholic beverages for 24 hours or as instructed by your caregiver.  MEDICATIONS  You may resume your normal medications unless your caregiver tells you otherwise.  WHAT YOU CAN EXPECT TODAY  You may experience abdominal discomfort such as a feeling of fullness or gas pains.  FOLLOW-UP  Your doctor will discuss the results of your test with you.  SEEK IMMEDIATE MEDICAL ATTENTION IF ANY OF THE FOLLOWING OCCUR:  Excessive nausea (feeling sick to your  stomach) and/or vomiting.   Severe abdominal pain and distention (swelling).   Trouble swallowing.   Temperature over 101 F (37.8 C).   Rectal bleeding or vomiting of blood.   GERD information provided  Further recommendations to follow pending review of pathology report  Office visit with [place] in 3 months

## 2016-11-18 NOTE — Anesthesia Preprocedure Evaluation (Signed)
Anesthesia Evaluation  Patient identified by MRN, date of birth, ID band Patient awake    Reviewed: Allergy & Precautions, H&P , NPO status , Patient's Chart, lab work & pertinent test results  Airway Mallampati: II  TM Distance: >3 FB Neck ROM: Full    Dental no notable dental hx.    Pulmonary neg pulmonary ROS, shortness of breath, sleep apnea , former smoker,    Pulmonary exam normal breath sounds clear to auscultation       Cardiovascular hypertension, Pt. on medications negative cardio ROS Normal cardiovascular exam Rhythm:Regular Rate:Normal     Neuro/Psych PSYCHIATRIC DISORDERS Depression negative neurological ROS  negative psych ROS   GI/Hepatic negative GI ROS, Neg liver ROS, hiatal hernia, GERD  Medicated and Controlled,  Endo/Other  negative endocrine ROS  Renal/GU Renal diseasenegative Renal ROS  negative genitourinary   Musculoskeletal negative musculoskeletal ROS (+) Arthritis ,   Abdominal   Peds negative pediatric ROS (+)  Hematology negative hematology ROS (+)   Anesthesia Other Findings   Reproductive/Obstetrics negative OB ROS                             Anesthesia Physical Anesthesia Plan  ASA: III  Anesthesia Plan: MAC   Post-op Pain Management:    Induction: Intravenous  PONV Risk Score and Plan:   Airway Management Planned: Simple Face Mask  Additional Equipment:   Intra-op Plan:   Post-operative Plan:   Informed Consent: I have reviewed the patients History and Physical, chart, labs and discussed the procedure including the risks, benefits and alternatives for the proposed anesthesia with the patient or authorized representative who has indicated his/her understanding and acceptance.     Plan Discussed with:   Anesthesia Plan Comments:         Anesthesia Quick Evaluation

## 2016-11-18 NOTE — Transfer of Care (Signed)
Immediate Anesthesia Transfer of Care Note  Patient: Brandon Lawrence  Procedure(s) Performed: Procedure(s) with comments: ESOPHAGOGASTRODUODENOSCOPY (EGD) WITH PROPOFOL (N/A) - 8:45am MALONEY DILATION (N/A) BIOPSY - gastric bx   Patient Location: PACU  Anesthesia Type:MAC  Level of Consciousness: awake, alert , oriented and patient cooperative  Airway & Oxygen Therapy: Patient Spontanous Breathing and Patient connected to nasal cannula oxygen  Post-op Assessment: Report given to RN and Post -op Vital signs reviewed and stable  Post vital signs: Reviewed and stable  Last Vitals:  Vitals:   11/18/16 0800 11/18/16 0805  BP: 128/73 (!) 141/75  Pulse:    Resp: 20 20  Temp:      Last Pain:  Vitals:   11/18/16 0746  TempSrc: Oral         Complications: No apparent anesthesia complications

## 2016-11-18 NOTE — Anesthesia Postprocedure Evaluation (Signed)
Anesthesia Post Note  Patient: Brandon Lawrence  Procedure(s) Performed: Procedure(s) (LRB): ESOPHAGOGASTRODUODENOSCOPY (EGD) WITH PROPOFOL (N/A) MALONEY DILATION (N/A) BIOPSY  Patient location during evaluation: PACU Anesthesia Type: MAC Level of consciousness: awake and alert, oriented and patient cooperative Pain management: pain level controlled Vital Signs Assessment: post-procedure vital signs reviewed and stable Respiratory status: spontaneous breathing, respiratory function stable and patient connected to nasal cannula oxygen Cardiovascular status: stable Postop Assessment: no signs of nausea or vomiting Anesthetic complications: no     Last Vitals:  Vitals:   11/18/16 0800 11/18/16 0805  BP: 128/73 (!) 141/75  Pulse:    Resp: 20 20  Temp:      Last Pain:  Vitals:   11/18/16 0746  TempSrc: Oral                 Gabryela Kimbrell A

## 2016-11-18 NOTE — Anesthesia Procedure Notes (Signed)
Procedure Name: MAC Date/Time: 11/18/2016 8:18 AM Performed by: Andree Elk, Alexander Aument A Pre-anesthesia Checklist: Patient identified, Emergency Drugs available, Suction available and Patient being monitored Oxygen Delivery Method: Simple face mask

## 2016-11-18 NOTE — Op Note (Signed)
[facility] Patient Name: Brandon Lawrence Procedure Date: 11/18/2016 8:11 AM MRN: [id number] Date of Birth: [birth year] Attending MD: Norvel Richards , MD CSN: [id number] Age: 56 Admit Type: Outpatient Procedure:                Upper GI endoscopy Indications:              Dysphagia, Heartburn Providers:                Norvel Richards, MD, Rosina Lowenstein, RN, Randa Spike, Technician Referring MD:              Medicines:                Propofol per Anesthesia Complications:            No immediate complications. Estimated Blood Loss:     Estimated blood loss was minimal. Procedure:                Pre-Anesthesia Assessment:                           - Prior to the procedure, a History and Physical                            was performed, and patient medications and                            allergies were reviewed. The patient's tolerance of                            previous anesthesia was also reviewed. The risks                            and benefits of the procedure and the sedation                            options and risks were discussed with the patient.                            All questions were answered, and informed consent                            was obtained. Prior Anticoagulants: The patient has                            taken no previous anticoagulant or antiplatelet                            agents. ASA Grade Assessment: III - A patient with                            severe systemic disease. After reviewing the risks  and benefits, the patient was deemed in                            satisfactory condition to undergo the procedure.                           After obtaining informed consent, the endoscope was                            passed under direct vision. Throughout the                            procedure, the patient's blood pressure, pulse, and                            oxygen  saturations were monitored continuously. The                            EG29-iL0 ([id number]) scope was introduced through the                            and advanced to the second part of duodenum. The                            upper GI endoscopy was accomplished without                            difficulty. The patient tolerated the procedure                            well. Scope In: 8:25:48 AM Scope Out: 8:32:21 AM Total Procedure Duration: 0 hours 6 minutes 33 seconds  Findings:      The examined esophagus was normal.      Diffuse friable mucosa with no bleeding was found in the entire examined       stomach. .      The duodenal bulb and second portion of the duodenum were normal. The       scope was withdrawn. Dilation was performed with a Maloney dilator with       mild resistance at 56 Fr. The dilation site was examined following       endoscope reinsertion and showed no change. Finally, abnormal gastric       mucosa was biopsied with a cold forceps for histology. Estimated blood       loss was minimal. Impression:               status post dilation                           - Normal esophagus. status post dilation                           - Friable gastric mucosa. . Biopsied.                           - Normal duodenal bulb and second portion of  the                            duodenum. Moderate Sedation:      Moderate (conscious) sedation was personally administered by an       anesthesia professional. The following parameters were monitored: oxygen       saturation, heart rate, blood pressure, respiratory rate, EKG, adequacy       of pulmonary ventilation, and response to care. Total physician       intraservice time was 9 minutes. Recommendation:           - Patient has a contact number available for                            emergencies. The signs and symptoms of potential                            delayed complications were discussed with the                             patient. Return to normal activities tomorrow.                            Written discharge instructions were provided to the                            patient.                           - Advance diet as tolerated.                           - Continue present medications.                           - Await pathology results.                           - No repeat upper endoscopy.                           - Return to GI office in 3 months. Procedure Code(s):        --- Professional ---                           [phone], Esophagogastroduodenoscopy, flexible,                            transoral; with dilation of gastric/duodenal                            stricture(s) (eg, balloon, bougie)                           43239, Esophagogastroduodenoscopy, flexible,                            transoral; with biopsy,  single or multiple Diagnosis Code(s):        --- Professional ---                           K31.89, Other diseases of stomach and duodenum                           R13.10, Dysphagia, unspecified                           R12, Heartburn CPT copyright 2016 [employer]. All rights reserved. The codes documented in this report are preliminary and upon coder review may  be revised to meet current compliance requirements. Cristopher Estimable. Rourk, MD Norvel Richards, MD 11/18/2016 8:42:38 AM This report has been signed electronically. Number of Addenda: 0
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
## 2016-11-19 NOTE — Addendum Note (Signed)
Addendum  created 11/19/16 0706 by Ollen Bowl, CRNA   Anesthesia Staff edited

## 2016-11-20 ENCOUNTER — Encounter: Payer: Self-pay | Admitting: Family Medicine

## 2016-11-20 ENCOUNTER — Encounter (HOSPITAL_COMMUNITY): Payer: Self-pay | Admitting: Internal Medicine

## 2016-11-21 ENCOUNTER — Encounter: Payer: Self-pay | Admitting: Internal Medicine

## 2016-12-02 ENCOUNTER — Ambulatory Visit: Payer: Medicare PPO | Admitting: Nurse Practitioner

## 2016-12-07 ENCOUNTER — Other Ambulatory Visit: Payer: Self-pay | Admitting: Family Medicine

## 2016-12-13 ENCOUNTER — Encounter: Payer: Medicare PPO | Admitting: Family Medicine

## 2016-12-23 ENCOUNTER — Ambulatory Visit: Payer: Medicare PPO | Admitting: Nurse Practitioner

## 2016-12-25 ENCOUNTER — Encounter: Payer: Self-pay | Admitting: Nurse Practitioner

## 2016-12-31 ENCOUNTER — Other Ambulatory Visit: Payer: Self-pay | Admitting: Nurse Practitioner

## 2017-01-04 ENCOUNTER — Other Ambulatory Visit: Payer: Self-pay | Admitting: Family Medicine

## 2017-01-26 ENCOUNTER — Other Ambulatory Visit: Payer: Self-pay | Admitting: Family Medicine

## 2017-01-27 ENCOUNTER — Ambulatory Visit: Payer: Medicare PPO | Admitting: Nurse Practitioner

## 2017-01-27 ENCOUNTER — Other Ambulatory Visit: Payer: Self-pay | Admitting: Family Medicine

## 2017-01-27 NOTE — Telephone Encounter (Signed)
Medication refill for one time only.  Patient needs to be seen.  Letter sent for patient to call and schedule 

## 2017-01-28 ENCOUNTER — Ambulatory Visit: Payer: Medicare PPO | Admitting: Nurse Practitioner

## 2017-02-26 ENCOUNTER — Encounter (HOSPITAL_COMMUNITY): Payer: Self-pay | Admitting: Cardiology

## 2017-02-26 ENCOUNTER — Emergency Department (HOSPITAL_COMMUNITY): Payer: Medicare PPO

## 2017-02-26 ENCOUNTER — Encounter: Payer: Self-pay | Admitting: Family Medicine

## 2017-02-26 ENCOUNTER — Emergency Department (HOSPITAL_COMMUNITY)
Admission: EM | Admit: 2017-02-26 | Discharge: 2017-02-26 | Disposition: A | Discharge status: Home / Self Care | Payer: Medicare PPO | Attending: Emergency Medicine | Admitting: Emergency Medicine

## 2017-02-26 ENCOUNTER — Ambulatory Visit (INDEPENDENT_AMBULATORY_CARE_PROVIDER_SITE_OTHER): Payer: Medicare PPO | Admitting: Family Medicine

## 2017-02-26 VITALS — BP 140/62 | HR 80 | Temp 98.2°F | Resp 14 | Ht 66.0 in | Wt 135.0 lb

## 2017-02-26 DIAGNOSIS — I10 Essential (primary) hypertension: Secondary | ICD-10-CM | POA: Insufficient documentation

## 2017-02-26 DIAGNOSIS — Z79899 Other long term (current) drug therapy: Secondary | ICD-10-CM | POA: Insufficient documentation

## 2017-02-26 DIAGNOSIS — R61 Generalized hyperhidrosis: Secondary | ICD-10-CM | POA: Insufficient documentation

## 2017-02-26 DIAGNOSIS — Z85528 Personal history of other malignant neoplasm of kidney: Secondary | ICD-10-CM | POA: Insufficient documentation

## 2017-02-26 DIAGNOSIS — R9431 Abnormal electrocardiogram [ECG] [EKG]: Secondary | ICD-10-CM | POA: Diagnosis not present

## 2017-02-26 DIAGNOSIS — R0789 Other chest pain: Secondary | ICD-10-CM | POA: Insufficient documentation

## 2017-02-26 DIAGNOSIS — R079 Chest pain, unspecified: Secondary | ICD-10-CM | POA: Diagnosis not present

## 2017-02-26 DIAGNOSIS — R0602 Shortness of breath: Secondary | ICD-10-CM | POA: Diagnosis not present

## 2017-02-26 DIAGNOSIS — Z87891 Personal history of nicotine dependence: Secondary | ICD-10-CM | POA: Diagnosis not present

## 2017-02-26 DIAGNOSIS — R05 Cough: Secondary | ICD-10-CM | POA: Diagnosis not present

## 2017-02-26 HISTORY — DX: Personal history of other medical treatment: Z92.89

## 2017-02-26 LAB — CBC WITH DIFFERENTIAL/PLATELET
BASOS ABS: 0 10*3/uL (ref 0.0–0.1)
BASOS PCT: 0 %
EOS ABS: 0.4 10*3/uL (ref 0.0–0.7)
Eosinophils Relative: 5 %
HEMATOCRIT: 34.8 % — AB (ref 39.0–52.0)
Hemoglobin: 12 g/dL — ABNORMAL LOW (ref 13.0–17.0)
Lymphocytes Relative: 61 %
Lymphs Abs: 4.5 10*3/uL — ABNORMAL HIGH (ref 0.7–4.0)
MCH: 31.4 pg (ref 26.0–34.0)
MCHC: 34.5 g/dL (ref 30.0–36.0)
MCV: 91.1 fL (ref 78.0–100.0)
MONO ABS: 0.7 10*3/uL (ref 0.1–1.0)
Monocytes Relative: 9 %
NEUTROS ABS: 1.9 10*3/uL (ref 1.7–7.7)
Neutrophils Relative %: 25 %
Platelets: 162 10*3/uL (ref 150–400)
RBC: 3.82 MIL/uL — ABNORMAL LOW (ref 4.22–5.81)
RDW: 14.7 % (ref 11.5–15.5)
WBC: 7.5 10*3/uL (ref 4.0–10.5)

## 2017-02-26 LAB — I-STAT CHEM 8, ED
BUN: 17 mg/dL (ref 6–20)
CREATININE: 1.1 mg/dL (ref 0.61–1.24)
Calcium, Ion: 1.21 mmol/L (ref 1.15–1.40)
Chloride: 105 mmol/L (ref 101–111)
GLUCOSE: 131 mg/dL — AB (ref 65–99)
HCT: 35 % — ABNORMAL LOW (ref 39.0–52.0)
HEMOGLOBIN: 11.9 g/dL — AB (ref 13.0–17.0)
Potassium: 3.5 mmol/L (ref 3.5–5.1)
Sodium: 140 mmol/L (ref 135–145)
TCO2: 23 mmol/L (ref 22–32)

## 2017-02-26 LAB — I-STAT TROPONIN, ED
TROPONIN I, POC: 0 ng/mL (ref 0.00–0.08)
Troponin i, poc: 0 ng/mL (ref 0.00–0.08)

## 2017-02-26 MED ORDER — DULOXETINE HCL 30 MG PO CPEP: 30.0000 mg | ORAL_CAPSULE | Freq: Every day | ORAL | 2 refills | Status: DC

## 2017-02-26 MED ORDER — TRAMADOL HCL 50 MG PO TABS: 50.0000 mg | ORAL_TABLET | Freq: Three times a day (TID) | ORAL | 0 refills | Status: DC | PRN

## 2017-02-26 MED ORDER — DOXAZOSIN MESYLATE 4 MG PO TABS: 4.0000 mg | ORAL_TABLET | Freq: Every day | ORAL | 2 refills | Status: DC

## 2017-02-26 MED ORDER — MIRTAZAPINE 15 MG PO TABS: ORAL_TABLET | ORAL | 1 refills | Status: DC

## 2017-02-26 MED ORDER — ASPIRIN 81 MG PO CHEW
324.0000 mg | CHEWABLE_TABLET | Freq: Once | ORAL | Status: AC
  Administered 2017-02-26: 324 mg via ORAL
  Filled 2017-02-26: qty 4

## 2017-02-26 MED ORDER — DEXLANSOPRAZOLE 60 MG PO CPDR: 60.0000 mg | DELAYED_RELEASE_CAPSULE | Freq: Every day | ORAL | 2 refills | Status: DC

## 2017-02-26 MED ORDER — NITROGLYCERIN 0.4 MG SL SUBL
0.4000 mg | SUBLINGUAL_TABLET | SUBLINGUAL | Status: DC | PRN
  Filled 2017-02-26: qty 1

## 2017-02-26 NOTE — Patient Instructions (Signed)
Go directly to [facility]  Your EKG is abnormal F/U pending results of ER evaluation
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
## 2017-02-26 NOTE — ED Triage Notes (Signed)
Chest pain times 2 weeks. Seen PCP today and had EKG done.  Changes in EKG from prior EKG.

## 2017-02-26 NOTE — Progress Notes (Signed)
Subjective:    Patient ID: Brandon Lawrence, male    DOB: 06/23/1960, 56 y.o.   MRN: [id number]  Patient presents for R Sided Chest Pain (x2 weeks- states that he has intermittent pain that feels like it may be gas at times)   For past 2 weeks, has had chest pain on right side, he does have sore spot in his chest as well.  He did not think to go to the emergency room because it was not on the left side of his chest.  He is difficult to get direct information out of.  States that he did have some episodes where he felt short of breath couple times he did break out in a sweat.  He then states when he was exercising the pain was a little worse but when he described his  exercise and it looks like he was to stretching his arm upwards and this may have been more muscular pain. He does not have any current pain.  He then thought it may have been gas pain as he ran out of of 1 of his stomach pills Dexilant.  Has chronic abdominal pain- taking Dexilant ,carafate, ultram as needed   Had mild cough, non productive, no fever, 1 episode of emesis after ham/tomatoe sandwhich, no diarrhea   Noted weight loss of 6 pounds he is on for constipation on Linzess , often will not eat because he is afraid his stomach will hurt        Review Of Systems:  GEN- denies fatigue, fever, weight loss,weakness, recent illness HEENT- denies eye drainage, change in vision, nasal discharge, CVS- denies chest pain, palpitations RESP- denies SOB, cough, wheeze ABD- denies N/V, change in stools, abd pain GU- denies dysuria, hematuria, dribbling, incontinence MSK- denies joint pain, muscle aches, injury Neuro- denies headache, dizziness, syncope, seizure activity       Objective:    BP 140/62   Pulse 80   Temp 98.2 F (36.8 C) (Oral)   Resp 14   Ht 5\' 6"  (1.676 m)   Wt 135 lb (61.2 kg)   SpO2 96%   BMI 21.79 kg/m  GEN- NAD, alert and oriented x3 HEENT- PERRL, EOMI, non injected sclera, pink conjunctiva,  MMM, oropharynx clear Neck- Supple, no thyromegaly Chest wall- TTP Right chest wall near axilla  CVS- RRR, no murmur RESP-CTAB ABD-NABS,soft,epigastric region mild TTP, no rebound no guarding  EXT- No edema Pulses- Radial 2+  EKG- Sinus bradycardia HR 56, ST elevation V4-V6 and II   Pair to previous EKGs he has had some early repolarization noted back in the spring but did not appear to have the same pattern as that ST elevation in the lateral leads today      Assessment & Plan:      Problem List Items Addressed This Visit      Unprioritized   Atypical chest pain - Primary   Relevant Orders   EKG 12-Lead (Completed)   Essential hypertension, benign    Blood pressure is mildly elevated systolic he is on the Cardura which helps for his prostate and the blood pressure. Concerned about his chest pain although it is atypical in the right side and some of this may be muscular and he does have underlying chronic abdominal pain which often obscures his symptoms he has changes on his EKG concerning for coronary blockage.  He is not complaining of any discomfort at this time and feels okay but I think this warrants urgent evaluation.  I discussed going directly to the emergency room for cardiac workup and troponins he agrees to go.  He does not want to go via ambulance I think this is reasonable as he has had chest pain for 2 weeks none currently oxygenation is normal blood pressure is a little elevated but not hypertensive urgency.      Relevant Medications   doxazosin (CARDURA) 4 MG tablet    Other Visit Diagnoses    ST elevation          Note: This dictation was prepared with Dragon dictation along with smaller phrase technology. Any transcriptional errors that result from this process are unintentional.

## 2017-02-26 NOTE — Discharge Instructions (Signed)
Take your usual prescriptions as previously directed.  Call your regular medical doctor and he Cardiologist tomorrow to schedule a follow up appointment within the next week.  Return to the Emergency Department immediately sooner if worsening.

## 2017-02-26 NOTE — Assessment & Plan Note (Signed)
Blood pressure is mildly elevated systolic he is on the Cardura which helps for his prostate and the blood pressure. Concerned about his chest pain although it is atypical in the right side and some of this may be muscular and he does have underlying chronic abdominal pain which often obscures his symptoms he has changes on his EKG concerning for coronary blockage.  He is not complaining of any discomfort at this time and feels okay but I think this warrants urgent evaluation.  I discussed going directly to the emergency room for cardiac workup and troponins he agrees to go.  He does not want to go via ambulance I think this is reasonable as he has had chest pain for 2 weeks none currently oxygenation is normal blood pressure is a little elevated but not hypertensive urgency.

## 2017-02-26 NOTE — ED Notes (Signed)
Patient denies pain and is resting comfortably.  

## 2017-02-28 ENCOUNTER — Encounter: Payer: Self-pay | Admitting: Cardiovascular Disease

## 2017-02-28 ENCOUNTER — Ambulatory Visit (INDEPENDENT_AMBULATORY_CARE_PROVIDER_SITE_OTHER): Payer: Medicare PPO | Admitting: Cardiovascular Disease

## 2017-02-28 VITALS — BP 116/68 | HR 76 | Ht 63.0 in | Wt 138.0 lb

## 2017-02-28 DIAGNOSIS — R0602 Shortness of breath: Secondary | ICD-10-CM | POA: Diagnosis not present

## 2017-02-28 DIAGNOSIS — R079 Chest pain, unspecified: Secondary | ICD-10-CM

## 2017-02-28 NOTE — Patient Instructions (Addendum)
Your physician recommends that you schedule a follow-up appointment in: to be determined    Please schedule your Coronary CT  Please get lab work before test:BMET   Your physician recommends that you continue on your current medications as directed. Please refer to the Current Medication list given to you today.     If you need a refill on your cardiac medications before your next appointment, please call your pharmacy.      Thank you for choosing [facility] !
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
## 2017-03-03 ENCOUNTER — Encounter: Payer: Self-pay | Admitting: Family Medicine

## 2017-03-03 ENCOUNTER — Ambulatory Visit (INDEPENDENT_AMBULATORY_CARE_PROVIDER_SITE_OTHER): Payer: Medicare PPO | Admitting: Family Medicine

## 2017-03-03 VITALS — BP 122/70 | HR 60 | Temp 98.4°F | Resp 16 | Ht 63.0 in | Wt 139.0 lb

## 2017-03-03 DIAGNOSIS — F1290 Cannabis use, unspecified, uncomplicated: Secondary | ICD-10-CM

## 2017-03-03 DIAGNOSIS — Z23 Encounter for immunization: Secondary | ICD-10-CM

## 2017-03-03 DIAGNOSIS — R0789 Other chest pain: Secondary | ICD-10-CM

## 2017-03-03 DIAGNOSIS — R112 Nausea with vomiting, unspecified: Secondary | ICD-10-CM

## 2017-03-03 MED ORDER — ONDANSETRON HCL 4 MG PO TABS: 4.0000 mg | ORAL_TABLET | Freq: Three times a day (TID) | ORAL | 0 refills | Status: DC | PRN

## 2017-03-03 NOTE — Patient Instructions (Addendum)
Flu shot done  F/U 4 months Physical

## 2017-03-03 NOTE — Assessment & Plan Note (Signed)
He has cut back but he is not quit smoking marijuana he does note this contributes to his chronic nausea and abdominal pain.  I did refill Zofran today.  He will follow-up with GI next week

## 2017-03-03 NOTE — Assessment & Plan Note (Signed)
Reviewed ER note as well as cardiology note his CT cardiac will be next week his lipid panel besides a low HDL was unremarkable 6 months ago

## 2017-03-03 NOTE — Progress Notes (Signed)
   Subjective:    Patient ID: Brandon Lawrence, male    DOB: 04-15-1961, 56 y.o.   MRN: [id number]  Patient presents for ER F/U (chest pain)   Pt here to ER follow up sent from my office due to atypical chest pain, EKG chnages. NSTEMI ruled out, seen by cardiology has Cardiac CT next week No further CP, no SOB  Continues to have chronic nausea- still smoking marijuana , had N/V this morning, out of his nausea medication. No change in bowels, has GI appointment next week   Flu shot    Review Of Systems:  GEN- denies fatigue, fever, weight loss,weakness, recent illness HEENT- denies eye drainage, change in vision, nasal discharge, CVS- denies chest pain, palpitations RESP- denies SOB, cough, wheeze ABD- + N/V, change in stools, abd pain GU- denies dysuria, hematuria, dribbling, incontinence MSK- denies joint pain, muscle aches, injury Neuro- denies headache, dizziness, syncope, seizure activity       Objective:    BP 122/70   Pulse 60   Temp 98.4 F (36.9 C) (Oral)   Resp 16   Ht 5\' 3"  (1.6 m)   Wt 139 lb (63 kg)   SpO2 96%   BMI 24.62 kg/m  GEN- NAD, alert and oriented x3,walks with cane  HEENT- PERRL, EOMI, non injected sclera, pink conjunctiva, MMM, oropharynx clear CVS- RRR, no murmur RESP-CTAB ABD-NABS,soft,NT,ND EXT- No edema Pulses- Radial  2+        Assessment & Plan:      Problem List Items Addressed This Visit      Unprioritized   Nausea and vomiting   Marijuana use    He has cut back but he is not quit smoking marijuana he does note this contributes to his chronic nausea and abdominal pain.  I did refill Zofran today.  He will follow-up with GI next week      Atypical chest pain - Primary    Reviewed ER note as well as cardiology note his CT cardiac will be next week his lipid panel besides a low HDL was unremarkable 6 months ago         Note: This dictation was prepared with Dragon dictation along with smaller phrase technology. Any  transcriptional errors that result from this process are unintentional.

## 2017-03-05 DIAGNOSIS — Z01818 Encounter for other preprocedural examination: Secondary | ICD-10-CM | POA: Diagnosis not present

## 2017-03-05 LAB — BASIC METABOLIC PANEL
BUN: 13 mg/dL (ref 7–25)
CHLORIDE: 108 mmol/L (ref 98–110)
CO2: 29 mmol/L (ref 20–32)
CREATININE: 1.23 mg/dL (ref 0.70–1.33)
Calcium: 8.6 mg/dL (ref 8.6–10.3)
Glucose, Bld: 97 mg/dL (ref 65–139)
POTASSIUM: 3.9 mmol/L (ref 3.5–5.3)
SODIUM: 140 mmol/L (ref 135–146)

## 2017-03-10 ENCOUNTER — Ambulatory Visit (INDEPENDENT_AMBULATORY_CARE_PROVIDER_SITE_OTHER): Payer: Medicare PPO | Admitting: Nurse Practitioner

## 2017-03-10 ENCOUNTER — Telehealth: Payer: Self-pay

## 2017-03-10 ENCOUNTER — Ambulatory Visit: Payer: Medicare PPO | Admitting: Nurse Practitioner

## 2017-03-10 ENCOUNTER — Encounter: Payer: Self-pay | Admitting: Nurse Practitioner

## 2017-03-10 VITALS — BP 118/64 | HR 57 | Temp 97.6°F | Ht 64.0 in | Wt 140.4 lb

## 2017-03-10 DIAGNOSIS — K219 Gastro-esophageal reflux disease without esophagitis: Secondary | ICD-10-CM

## 2017-03-10 DIAGNOSIS — K5909 Other constipation: Secondary | ICD-10-CM

## 2017-03-10 DIAGNOSIS — R112 Nausea with vomiting, unspecified: Secondary | ICD-10-CM

## 2017-03-10 NOTE — Assessment & Plan Note (Signed)
Constipation is well managed on Linzess.  Recommend he continue this.  Return for follow-up in 3 months.

## 2017-03-10 NOTE — Telephone Encounter (Signed)
[facility] called to ok pts Zantac rx. Pt was seen in office today and was asked to refill meds per Randall Hiss.  Ok to refill med as directed by [employer]. See ov notes.

## 2017-03-10 NOTE — Patient Instructions (Signed)
1. Call your pharmacy and tell them you need a refill of Zantac. 2. If they need [place] to send them further refill authorizations they can contact us for that. 3. It is very important that you call your pharmacy when you are about to run out of any of your medications so that they can refill the medicines that you are about to run out of. 4. Otherwise, the pharmacy has no way of knowing that she need a refill of your medicines. 5. Continue your current medications. 6. Return for follow-up in 3 months.

## 2017-03-10 NOTE — Assessment & Plan Note (Signed)
Worsening GERD symptoms.  He ran out of Zantac and did not notify the pharmacy he needed more.  I have recommended, as I have recommended multiple times in the past, when he is about to run out of his medicines to notify the pharmacy so they can refill it.  Otherwise they do not know what medications he needs.  He verbalized understanding.  Continue medications as directed.  Return for follow-up in 3 months to reevaluate his symptoms on his medications.

## 2017-03-10 NOTE — Assessment & Plan Note (Signed)
The patient describes intermittent nausea and vomiting.  This is worse since his abdominal pain is worse likely due to uncontrolled GERD.  He ran out of Zantac and did not notify the pharmacy of needing more.  He has a chronic history of doing this.  Recommended he call the pharmacy and get his medications refilled when he is about to run out.  Return for follow-up in 3 months.  Continue prescription medications as directed.
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
## 2017-03-11 NOTE — Progress Notes (Signed)
CC'ED TO PCP 

## 2017-03-14 ENCOUNTER — Ambulatory Visit (HOSPITAL_COMMUNITY)
Admission: RE | Admit: 2017-03-14 | Discharge: 2017-03-14 | Disposition: A | Discharge status: Home / Self Care | Payer: Medicare PPO | Source: Ambulatory Visit | Attending: Cardiovascular Disease | Admitting: Cardiovascular Disease

## 2017-03-14 DIAGNOSIS — I2510 Atherosclerotic heart disease of native coronary artery without angina pectoris: Secondary | ICD-10-CM | POA: Diagnosis not present

## 2017-03-14 DIAGNOSIS — R079 Chest pain, unspecified: Secondary | ICD-10-CM

## 2017-03-14 MED ORDER — NITROGLYCERIN 0.4 MG SL SUBL
0.8000 mg | SUBLINGUAL_TABLET | Freq: Once | SUBLINGUAL | Status: AC
  Administered 2017-03-14: 0.8 mg via SUBLINGUAL
  Filled 2017-03-14: qty 25

## 2017-03-14 MED ORDER — NITROGLYCERIN 0.4 MG SL SUBL
SUBLINGUAL_TABLET | SUBLINGUAL | Status: AC
  Filled 2017-03-14: qty 2

## 2017-03-14 MED ORDER — IOPAMIDOL (ISOVUE-370) INJECTION 76%
INTRAVENOUS | Status: AC
  Filled 2017-03-14: qty 100

## 2017-03-14 NOTE — Progress Notes (Signed)
CT scan completed. Tolerated well. D/C home with companion. Awake and alert. In no distress 

## 2017-04-11 DIAGNOSIS — M545 Low back pain: Secondary | ICD-10-CM | POA: Diagnosis not present

## 2017-04-11 DIAGNOSIS — K219 Gastro-esophageal reflux disease without esophagitis: Secondary | ICD-10-CM | POA: Diagnosis not present

## 2017-04-11 DIAGNOSIS — G4700 Insomnia, unspecified: Secondary | ICD-10-CM | POA: Diagnosis not present

## 2017-04-11 DIAGNOSIS — M1990 Unspecified osteoarthritis, unspecified site: Secondary | ICD-10-CM | POA: Diagnosis not present

## 2017-04-11 DIAGNOSIS — K5900 Constipation, unspecified: Secondary | ICD-10-CM | POA: Diagnosis not present

## 2017-04-11 DIAGNOSIS — F330 Major depressive disorder, recurrent, mild: Secondary | ICD-10-CM | POA: Diagnosis not present

## 2017-04-11 DIAGNOSIS — I10 Essential (primary) hypertension: Secondary | ICD-10-CM | POA: Diagnosis not present

## 2017-05-20 ENCOUNTER — Telehealth: Payer: Self-pay | Admitting: *Deleted

## 2017-05-20 NOTE — Telephone Encounter (Signed)
Received call from patient daughter Elmo Putt. [phone]~ telephone.   Inquired as to status of forms patient was to drop off for [place].  Contact is not on DPR; no information given.   Call placed to patient to inquire. States that he does have forms to be completed but has not brought to office. Advised to discuss status of forms with daughter. Also advised that if patient would like daughter to be able to discuss care, new DPR will be required.    Verbalized understanding. Reports that forms will be submitted on 05/21/2017.

## 2017-06-05 ENCOUNTER — Other Ambulatory Visit: Payer: Self-pay | Admitting: Nurse Practitioner

## 2017-06-10 ENCOUNTER — Encounter: Payer: Self-pay | Admitting: Nurse Practitioner

## 2017-06-10 ENCOUNTER — Ambulatory Visit (INDEPENDENT_AMBULATORY_CARE_PROVIDER_SITE_OTHER): Payer: Medicare PPO | Admitting: Nurse Practitioner

## 2017-06-10 VITALS — BP 123/69 | HR 71 | Temp 98.5°F | Ht 66.0 in | Wt 145.0 lb

## 2017-06-10 DIAGNOSIS — K5909 Other constipation: Secondary | ICD-10-CM

## 2017-06-10 DIAGNOSIS — R112 Nausea with vomiting, unspecified: Secondary | ICD-10-CM

## 2017-06-10 DIAGNOSIS — K219 Gastro-esophageal reflux disease without esophagitis: Secondary | ICD-10-CM

## 2017-06-10 DIAGNOSIS — R1030 Lower abdominal pain, unspecified: Secondary | ICD-10-CM

## 2017-06-10 NOTE — Patient Instructions (Signed)
1. To need taking your current medications. 2. Remember, when you are about to run out of your medications you need to call the pharmacy and ask for a refill. 3. Running out of your medications will make her symptoms worse. 4. Follow-up in 6 months. 5. Call us if you have any questions or concerns.
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
## 2017-06-10 NOTE — Assessment & Plan Note (Signed)
Abdominal pain is significantly improved with improvement in GERD and constipation.  Recommend he continue his medications as per above, follow-up in 6 months.

## 2017-06-10 NOTE — Progress Notes (Signed)
cc'ed to pcp °

## 2017-06-10 NOTE — Assessment & Plan Note (Signed)
GERD is significantly improved not he is on Dexilant and back on Zantac.  Recommend he refill his medications when he is near running out.  Return for follow-up in 6 months.

## 2017-06-10 NOTE — Assessment & Plan Note (Signed)
Constipation is significantly improved on Linzess.  On days where he feels constipated when he takes his morning dose of Linzess he typically has a bowel movement within 2 hours.  He feels much better after this.  Continue current medications, follow-up in 6 months.

## 2017-06-10 NOTE — Assessment & Plan Note (Signed)
Nausea and vomiting is significantly improved now that his medications were all refilled and he is taking them as directed.  Recommend continue medications, refill when he is about to run out.  He verbalized understanding.  Return for follow-up in 6 months.

## 2017-08-30 ENCOUNTER — Other Ambulatory Visit: Payer: Self-pay | Admitting: Family Medicine

## 2017-09-03 ENCOUNTER — Other Ambulatory Visit: Payer: Self-pay | Admitting: Family Medicine

## 2017-11-14 ENCOUNTER — Encounter: Payer: Self-pay | Admitting: Family Medicine

## 2017-11-14 ENCOUNTER — Ambulatory Visit: Payer: Medicare PPO | Admitting: Family Medicine

## 2017-11-14 ENCOUNTER — Other Ambulatory Visit: Payer: Self-pay

## 2017-11-14 VITALS — BP 120/62 | HR 64 | Temp 97.9°F | Resp 16 | Ht 66.0 in | Wt 135.0 lb

## 2017-11-14 DIAGNOSIS — Z125 Encounter for screening for malignant neoplasm of prostate: Secondary | ICD-10-CM

## 2017-11-14 DIAGNOSIS — N400 Enlarged prostate without lower urinary tract symptoms: Secondary | ICD-10-CM | POA: Diagnosis not present

## 2017-11-14 DIAGNOSIS — G8929 Other chronic pain: Secondary | ICD-10-CM

## 2017-11-14 DIAGNOSIS — D649 Anemia, unspecified: Secondary | ICD-10-CM | POA: Diagnosis not present

## 2017-11-14 DIAGNOSIS — E441 Mild protein-calorie malnutrition: Secondary | ICD-10-CM | POA: Diagnosis not present

## 2017-11-14 DIAGNOSIS — Z1321 Encounter for screening for nutritional disorder: Secondary | ICD-10-CM | POA: Diagnosis not present

## 2017-11-14 DIAGNOSIS — R109 Unspecified abdominal pain: Secondary | ICD-10-CM

## 2017-11-14 DIAGNOSIS — D539 Nutritional anemia, unspecified: Secondary | ICD-10-CM | POA: Diagnosis not present

## 2017-11-14 DIAGNOSIS — F325 Major depressive disorder, single episode, in full remission: Secondary | ICD-10-CM

## 2017-11-14 DIAGNOSIS — I10 Essential (primary) hypertension: Secondary | ICD-10-CM

## 2017-11-14 DIAGNOSIS — E46 Unspecified protein-calorie malnutrition: Secondary | ICD-10-CM | POA: Insufficient documentation

## 2017-11-14 DIAGNOSIS — F330 Major depressive disorder, recurrent, mild: Secondary | ICD-10-CM | POA: Insufficient documentation

## 2017-11-14 DIAGNOSIS — F1290 Cannabis use, unspecified, uncomplicated: Secondary | ICD-10-CM | POA: Diagnosis not present

## 2017-11-14 DIAGNOSIS — K219 Gastro-esophageal reflux disease without esophagitis: Secondary | ICD-10-CM | POA: Diagnosis not present

## 2017-11-14 LAB — CBC WITH DIFFERENTIAL/PLATELET
BASOS ABS: 28 {cells}/uL (ref 0–200)
Basophils Relative: 0.4 %
EOS PCT: 4 %
Eosinophils Absolute: 280 cells/uL (ref 15–500)
HCT: 32.9 % — ABNORMAL LOW (ref 38.5–50.0)
Hemoglobin: 11.3 g/dL — ABNORMAL LOW (ref 13.2–17.1)
LYMPHS ABS: 2954 {cells}/uL (ref 850–3900)
MCH: 31.7 pg (ref 27.0–33.0)
MCHC: 34.3 g/dL (ref 32.0–36.0)
MCV: 92.4 fL (ref 80.0–100.0)
MPV: 10.4 fL (ref 7.5–12.5)
Monocytes Relative: 7.4 %
NEUTROS ABS: 3220 {cells}/uL (ref 1500–7800)
Neutrophils Relative %: 46 %
Platelets: 180 10*3/uL (ref 140–400)
RBC: 3.56 10*6/uL — ABNORMAL LOW (ref 4.20–5.80)
RDW: 13 % (ref 11.0–15.0)
Total Lymphocyte: 42.2 %
WBC: 7 10*3/uL (ref 3.8–10.8)
WBCMIX: 518 {cells}/uL (ref 200–950)

## 2017-11-14 MED ORDER — DEXLANSOPRAZOLE 60 MG PO CPDR: DELAYED_RELEASE_CAPSULE | ORAL | 2 refills | Status: DC

## 2017-11-14 MED ORDER — ONDANSETRON HCL 4 MG PO TABS: 4.0000 mg | ORAL_TABLET | Freq: Three times a day (TID) | ORAL | 2 refills | Status: DC | PRN

## 2017-11-14 MED ORDER — DULOXETINE HCL 30 MG PO CPEP: ORAL_CAPSULE | ORAL | 2 refills | Status: DC

## 2017-11-14 MED ORDER — SUCRALFATE 1 G PO TABS: ORAL_TABLET | ORAL | 2 refills | Status: DC

## 2017-11-14 MED ORDER — DOXAZOSIN MESYLATE 4 MG PO TABS: ORAL_TABLET | ORAL | 2 refills | Status: DC

## 2017-11-14 MED ORDER — RANITIDINE HCL 150 MG PO TABS: 150.0000 mg | ORAL_TABLET | Freq: Every day | ORAL | 3 refills | Status: DC

## 2017-11-14 MED ORDER — LINACLOTIDE 72 MCG PO CAPS: ORAL_CAPSULE | ORAL | 3 refills | Status: DC

## 2017-11-14 MED ORDER — MIRTAZAPINE 15 MG PO TABS: ORAL_TABLET | ORAL | 2 refills | Status: DC

## 2017-11-14 NOTE — Progress Notes (Signed)
Subjective:    Patient ID: Brandon Lawrence, male    DOB: [birth year], 57 y.o.   MRN: [id number]  Patient presents for Headache (x months- intermittent pain in back of head on B sides- states that it worsens with ABD pain) and Reflux (x6 months- states that he has burning in upper ABD and sometime feels like he has tightness in chest and feeling like food is getting stuck in throat)   Pt here to f/u chronic medical problems. Last seen in October, weight was in 139 lbs today 135lbs Still has chronic abdominal pain, has chronic nausea , continues to use marijuana which has been significant sourse of his chronic nausea GI upset throughout the years States he has a few pills at home, he is out of stomach medicines  Has blood pressure at home He has carafate Out of zofran, zantac linzess that he is aware of   Constipation- has not had linzess so bowels have sluggish, no blood in the stools   Gets headaches sometimes, points to back of head, not every, no change in vision, takes tylenol which helps         Review Of Systems:  GEN- denies fatigue, fever, weight loss,weakness, recent illness HEENT- denies eye drainage, change in vision, nasal discharge, CVS- denies chest pain, palpitations RESP- denies SOB, cough, wheeze ABD- denies N/V, change in stools,+ abd pain GU- denies dysuria, hematuria, dribbling, incontinence MSK- denies joint pain, muscle aches, injury Neuro- + headache, dizziness, syncope, seizure activity       Objective:    BP 120/62   Pulse 64   Temp 97.9 F (36.6 C) (Oral)   Resp 16   Ht 5\' 6"  (1.676 m)   Wt 135 lb (61.2 kg)   SpO2 96%   BMI 21.79 kg/m  GEN- NAD, alert and oriented x3,well appearing,walks with cane HEENT- PERRL, EOMI, non injected sclera, pink conjunctiva, MMM, oropharynx clear Neck- Supple, good ROM CVS- RRR, no murmur RESP-CTAB ABD-NABS,soft,NT,ND Neuro-CNII-XII grossly in tact, no deficits  EXT- No edema Pulses- Radial 2+         Assessment & Plan:      Problem List Items Addressed This Visit      Unprioritized   Benign prostatic hyperplasia    Has not f/u for physical labs, needs PSA History of RCC as well Continue cardura      Relevant Orders   PSA (Completed)   Chronic abdominal pain - Primary    MTF see previous notes, saw GI in the spring Refilled all his meds to get him back on tract Reiterated no marijuana linzess for constipation      Relevant Medications   mirtazapine (REMERON) 15 MG tablet   DULoxetine (CYMBALTA) 30 MG capsule   Other Relevant Orders   CBC with Differential/Platelet (Completed)   Comprehensive metabolic panel (Completed)   Depression, major, single episode, complete remission (HCC)   Relevant Medications   mirtazapine (REMERON) 15 MG tablet   DULoxetine (CYMBALTA) 30 MG capsule   Essential hypertension, benign    controlled      Relevant Medications   doxazosin (CARDURA) 4 MG tablet   Other Relevant Orders   Lipid panel (Completed)   GERD (gastroesophageal reflux disease)   Relevant Medications   sucralfate (CARAFATE) 1 g tablet   ranitidine (ZANTAC) 150 MG tablet   linaclotide (LINZESS) 72 MCG capsule   dexlansoprazole (DEXILANT) 60 MG capsule   ondansetron (ZOFRAN) 4 MG tablet   Marijuana use   Protein-calorie  malnutrition ([place])    Discussed drinking ensure/boost or other protein supplement Many foods upset his stomach or states makes his mouth itch        Other Visit Diagnoses    Prostate cancer screening          Note: This dictation was prepared with Dragon dictation along with smaller phrase technology. Any transcriptional errors that result from this process are unintentional.

## 2017-11-14 NOTE — Patient Instructions (Addendum)
Drink Boost/Ensure/ [employer]ator Breakfast to help gain weight, give you protein Restart all your stomach medications We will call with lab results Tylenol if you have a headache F/U 4 weeks for recheck , BRING ALL YOUR MEDICATIONS

## 2017-11-15 ENCOUNTER — Encounter: Payer: Self-pay | Admitting: Family Medicine

## 2017-11-15 NOTE — Assessment & Plan Note (Signed)
MTF see previous notes, saw GI in the spring Refilled all his meds to get him back on tract Reiterated no marijuana linzess for constipation

## 2017-11-15 NOTE — Assessment & Plan Note (Addendum)
Has not f/u for physical labs, needs PSA History of RCC as well Continue cardura

## 2017-11-15 NOTE — Assessment & Plan Note (Signed)
Discussed drinking ensure/boost or other protein supplement Many foods upset his stomach or states makes his mouth itch

## 2017-11-15 NOTE — Assessment & Plan Note (Signed)
controlled 

## 2017-11-18 LAB — COMPREHENSIVE METABOLIC PANEL
AG Ratio: 1.5 (calc) (ref 1.0–2.5)
ALT: 12 U/L (ref 9–46)
AST: 18 U/L (ref 10–35)
Albumin: 3.8 g/dL (ref 3.6–5.1)
Alkaline phosphatase (APISO): 42 U/L (ref 40–115)
BUN: 14 mg/dL (ref 7–25)
CO2: 28 mmol/L (ref 20–32)
Calcium: 8.6 mg/dL (ref 8.6–10.3)
Chloride: 105 mmol/L (ref 98–110)
Creat: 1.14 mg/dL (ref 0.70–1.33)
Globulin: 2.5 g/dL (calc) (ref 1.9–3.7)
Glucose, Bld: 97 mg/dL (ref 65–99)
Potassium: 4.1 mmol/L (ref 3.5–5.3)
Sodium: 139 mmol/L (ref 135–146)
Total Bilirubin: 0.3 mg/dL (ref 0.2–1.2)
Total Protein: 6.3 g/dL (ref 6.1–8.1)

## 2017-11-18 LAB — ANEMIA PROFILE B
%SAT: 14 % (calc) — ABNORMAL LOW (ref 20–48)
ABS Retic: 46410 cells/uL (ref 25000–9000)
Basophils Absolute: 28 cells/uL (ref 0–200)
Basophils Relative: 0.4 %
Eosinophils Absolute: 280 cells/uL (ref 15–500)
Eosinophils Relative: 4 %
Ferritin: 121 ng/mL (ref 38–380)
Folate: 2.2 ng/mL — ABNORMAL LOW
HCT: 32.9 % — ABNORMAL LOW (ref 38.5–50.0)
Hemoglobin: 11.3 g/dL — ABNORMAL LOW (ref 13.2–17.1)
Iron: 39 ug/dL — ABNORMAL LOW (ref 50–180)
Lymphs Abs: 2954 cells/uL (ref 850–3900)
MCH: 31.7 pg (ref 27.0–33.0)
MCHC: 34.3 g/dL (ref 32.0–36.0)
MCV: 92.4 fL (ref 80.0–100.0)
MPV: 10.4 fL (ref 7.5–12.5)
Monocytes Relative: 7.4 %
Neutro Abs: 3220 cells/uL (ref 1500–7800)
Neutrophils Relative %: 46 %
Platelets: 180 10*3/uL (ref 140–400)
RBC: 3.56 10*6/uL — ABNORMAL LOW (ref 4.20–5.80)
RDW: 13 % (ref 11.0–15.0)
Retic Ct Pct: 1.3 %
TIBC: 270 mcg/dL (calc) (ref 250–425)
Total Lymphocyte: 42.2 %
Vitamin B-12: 330 pg/mL (ref 200–1100)
WBC mixed population: 518 cells/uL (ref 200–950)
WBC: 7 10*3/uL (ref 3.8–10.8)

## 2017-11-18 LAB — LIPID PANEL
CHOL/HDL RATIO: 3.7 (calc) (ref <5.0)
CHOLESTEROL: 132 mg/dL (ref <200)
HDL: 36 mg/dL — AB (ref >40)
LDL Cholesterol (Calc): 81 mg/dL (calc)
Non-HDL Cholesterol (Calc): 96 mg/dL (calc) (ref <130)
Triglycerides: 70 mg/dL (ref <150)

## 2017-11-18 LAB — TEST AUTHORIZATION

## 2017-11-18 LAB — PSA: PSA: 1.1 ng/mL (ref <=4.0)

## 2017-11-24 DIAGNOSIS — R109 Unspecified abdominal pain: Secondary | ICD-10-CM | POA: Diagnosis not present

## 2017-11-24 DIAGNOSIS — G8929 Other chronic pain: Secondary | ICD-10-CM | POA: Diagnosis not present

## 2017-11-25 ENCOUNTER — Other Ambulatory Visit: Payer: Medicare PPO

## 2017-11-25 DIAGNOSIS — R109 Unspecified abdominal pain: Principal | ICD-10-CM

## 2017-11-25 DIAGNOSIS — G8929 Other chronic pain: Secondary | ICD-10-CM

## 2017-11-26 LAB — FECAL GLOBIN BY IMMUNOCHEMISTRY
FECAL GLOBIN RESULT: NOT DETECTED
MICRO NUMBER: 90870362
SOURCE: 0
SPECIMEN QUALITY: ADEQUATE

## 2017-12-08 ENCOUNTER — Encounter: Payer: Self-pay | Admitting: Nurse Practitioner

## 2017-12-08 ENCOUNTER — Ambulatory Visit (INDEPENDENT_AMBULATORY_CARE_PROVIDER_SITE_OTHER): Payer: Medicare PPO | Admitting: Nurse Practitioner

## 2017-12-08 VITALS — BP 129/66 | HR 66 | Temp 97.4°F | Ht 66.0 in | Wt 140.0 lb

## 2017-12-08 DIAGNOSIS — K5909 Other constipation: Secondary | ICD-10-CM

## 2017-12-08 DIAGNOSIS — K219 Gastro-esophageal reflux disease without esophagitis: Secondary | ICD-10-CM | POA: Diagnosis not present
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
## 2017-12-08 NOTE — Assessment & Plan Note (Signed)
Doing significantly better compared to baseline.  Having 1-2 soft bowel movements a day.  Continue Linzess, follow-up in 1 year.

## 2017-12-08 NOTE — Assessment & Plan Note (Signed)
Generally well controlled on Dexilant and Zantac.  Fried foods tend to cause flareups.  He has rare breakthrough symptoms.  Continue current medications and follow-up in 1 year.

## 2017-12-08 NOTE — Progress Notes (Signed)
cc'ed to pcp °

## 2017-12-08 NOTE — Patient Instructions (Signed)
1. Continue taking your current medications. 2. Return for follow-up in 1 year. 3. Call us if you have any questions or concerns.  At [facility] we value your feedback. You may receive a survey about your visit today. Please share your experience as we strive to create trusting relationships with our patients to provide genuine, compassionate, quality care.  It was great to see you today!  I hope you have a great summer!!

## 2017-12-12 ENCOUNTER — Ambulatory Visit: Payer: Medicare PPO | Admitting: Family Medicine

## 2017-12-15 ENCOUNTER — Encounter: Payer: Self-pay | Admitting: Family Medicine

## 2017-12-19 ENCOUNTER — Ambulatory Visit (INDEPENDENT_AMBULATORY_CARE_PROVIDER_SITE_OTHER): Payer: Medicare PPO | Admitting: Family Medicine

## 2017-12-19 ENCOUNTER — Encounter: Payer: Self-pay | Admitting: Family Medicine

## 2017-12-19 ENCOUNTER — Other Ambulatory Visit: Payer: Self-pay

## 2017-12-19 VITALS — BP 128/68 | HR 62 | Temp 98.2°F | Resp 12 | Ht 66.0 in | Wt 136.0 lb

## 2017-12-19 DIAGNOSIS — M1710 Unilateral primary osteoarthritis, unspecified knee: Secondary | ICD-10-CM | POA: Insufficient documentation

## 2017-12-19 DIAGNOSIS — M179 Osteoarthritis of knee, unspecified: Secondary | ICD-10-CM | POA: Insufficient documentation

## 2017-12-19 DIAGNOSIS — M1711 Unilateral primary osteoarthritis, right knee: Secondary | ICD-10-CM | POA: Diagnosis not present

## 2017-12-19 DIAGNOSIS — K219 Gastro-esophageal reflux disease without esophagitis: Secondary | ICD-10-CM | POA: Diagnosis not present

## 2017-12-19 DIAGNOSIS — D508 Other iron deficiency anemias: Secondary | ICD-10-CM

## 2017-12-19 MED ORDER — DICLOFENAC SODIUM 1 % TD GEL: TRANSDERMAL | 1 refills | Status: DC

## 2017-12-19 MED ORDER — FERROUS SULFATE 325 (65 FE) MG PO TABS: 325.0000 mg | ORAL_TABLET | Freq: Every day | ORAL | 6 refills | Status: DC

## 2017-12-19 NOTE — Progress Notes (Signed)
   Subjective:    Patient ID: Brandon Lawrence, male    DOB: 1960-12-05, 57 y.o.   MRN: [id number]  Patient presents for Follow-up (is not fasting)  Pt here to f/u medications and labs  Labs showed iron def anemia but neg hemaoccut, had GI appt, no changes to meds  Now has all of his meds, GI upset has improved.  He is also drinking ensure     States his right leg was swollen a few weeks ago always down his leg, he used a knee sleeve and swelling went to down    Review Of Systems:  GEN- denies fatigue, fever, weight loss,weakness, recent illness HEENT- denies eye drainage, change in vision, nasal discharge, CVS- denies chest pain, palpitations RESP- denies SOB, cough, wheeze ABD- denies N/V, change in stools, abd pain GU- denies dysuria, hematuria, dribbling, incontinence MSK- denies joint pain, muscle aches, injury Neuro- denies headache, dizziness, syncope, seizure activity       Objective:    BP 128/68   Pulse 62   Temp 98.2 F (36.8 C) (Oral)   Resp 12   Ht 5\' 6"  (1.676 m)   Wt 136 lb (61.7 kg)   SpO2 98%   BMI 21.95 kg/m  GEN- NAD, alert and oriented x3 HEENT- PERRL, EOMI, non injected sclera, pink conjunctiva, MMM, oropharynx clear CVS- RRR, no murmur RESP-CTAB ABD-NABS,soft,NT,ND EXT- No edema MSK- Fair ROM lower ext, mild effusion on right knee, no warmth or erythema Ambulates with cane at baseline Pulses- Radial 2+        Assessment & Plan:      Problem List Items Addressed This Visit      Unprioritized   GERD (gastroesophageal reflux disease)    Improved now he is back on his meds Advised not to pour meds into old bottles Also discussed getting medication weekly planner Reviewed GI note      Iron deficiency anemia - Primary    He is to take iron tablet once a day due to constipation      Relevant Medications   ferrous sulfate 325 (65 FE) MG tablet   OA (osteoarthritis) of knee    Treat with topical- RX Voltaren gel or tylenol No  NSAIDS due to GI problems Can go to Ortho if not improving Advised if severe swelling of leg needs to be seen          Note: This dictation was prepared with Dragon dictation along with smaller phrase technology. Any transcriptional errors that result from this process are unintentional.

## 2017-12-19 NOTE — Patient Instructions (Addendum)
Start iron tablet once a day  Continue all your medications  Use the voltaren gel for knee pain F/U 4-5 months for Physical

## 2017-12-21 ENCOUNTER — Encounter: Payer: Self-pay | Admitting: Family Medicine

## 2017-12-21 NOTE — Assessment & Plan Note (Signed)
Improved now he is back on his meds Advised not to pour meds into old bottles Also discussed getting medication weekly planner Reviewed GI note

## 2017-12-21 NOTE — Assessment & Plan Note (Signed)
He is to take iron tablet once a day due to constipation

## 2017-12-21 NOTE — Assessment & Plan Note (Signed)
Treat with topical- RX Voltaren gel or tylenol No NSAIDS due to GI problems Can go to Ortho if not improving Advised if severe swelling of leg needs to be seen

## 2018-02-10 DIAGNOSIS — G4700 Insomnia, unspecified: Secondary | ICD-10-CM | POA: Diagnosis not present

## 2018-02-10 DIAGNOSIS — M545 Low back pain: Secondary | ICD-10-CM | POA: Diagnosis not present

## 2018-02-10 DIAGNOSIS — H547 Unspecified visual loss: Secondary | ICD-10-CM | POA: Diagnosis not present

## 2018-02-10 DIAGNOSIS — K5900 Constipation, unspecified: Secondary | ICD-10-CM | POA: Diagnosis not present

## 2018-02-10 DIAGNOSIS — F330 Major depressive disorder, recurrent, mild: Secondary | ICD-10-CM | POA: Diagnosis not present

## 2018-02-10 DIAGNOSIS — K219 Gastro-esophageal reflux disease without esophagitis: Secondary | ICD-10-CM | POA: Diagnosis not present

## 2018-02-10 DIAGNOSIS — M1990 Unspecified osteoarthritis, unspecified site: Secondary | ICD-10-CM | POA: Diagnosis not present

## 2018-02-10 DIAGNOSIS — M170 Bilateral primary osteoarthritis of knee: Secondary | ICD-10-CM | POA: Diagnosis not present

## 2018-02-10 DIAGNOSIS — I10 Essential (primary) hypertension: Secondary | ICD-10-CM | POA: Diagnosis not present

## 2018-04-24 ENCOUNTER — Other Ambulatory Visit: Payer: Self-pay | Admitting: Gastroenterology

## 2018-05-13 ENCOUNTER — Encounter: Payer: Medicare PPO | Admitting: Family Medicine

## 2018-05-20 ENCOUNTER — Encounter: Payer: Self-pay | Admitting: Family Medicine

## 2018-05-29 ENCOUNTER — Telehealth: Payer: Self-pay | Admitting: Family Medicine

## 2018-05-29 MED ORDER — LINACLOTIDE 72 MCG PO CAPS
ORAL_CAPSULE | ORAL | 3 refills | Status: DC
Start: 2018-05-29 — End: 2018-07-28

## 2018-05-29 MED ORDER — ONDANSETRON HCL 4 MG PO TABS: 4.0000 mg | ORAL_TABLET | Freq: Three times a day (TID) | ORAL | 2 refills | Status: DC | PRN

## 2018-05-29 NOTE — Telephone Encounter (Signed)
Prescription sent to pharmacy.

## 2018-05-29 NOTE — Telephone Encounter (Signed)
Refill on linzess and zofran to [facility] [address]

## 2018-06-23 ENCOUNTER — Encounter: Payer: Self-pay | Admitting: Family Medicine

## 2018-06-23 ENCOUNTER — Other Ambulatory Visit: Payer: Self-pay

## 2018-06-23 ENCOUNTER — Ambulatory Visit (INDEPENDENT_AMBULATORY_CARE_PROVIDER_SITE_OTHER): Payer: Medicare PPO | Admitting: Family Medicine

## 2018-06-23 VITALS — BP 138/76 | HR 62 | Temp 98.6°F | Resp 14 | Ht 66.0 in | Wt 137.0 lb

## 2018-06-23 DIAGNOSIS — I10 Essential (primary) hypertension: Secondary | ICD-10-CM | POA: Diagnosis not present

## 2018-06-23 DIAGNOSIS — R8290 Unspecified abnormal findings in urine: Secondary | ICD-10-CM | POA: Diagnosis not present

## 2018-06-23 DIAGNOSIS — J069 Acute upper respiratory infection, unspecified: Secondary | ICD-10-CM

## 2018-06-23 DIAGNOSIS — R6889 Other general symptoms and signs: Secondary | ICD-10-CM | POA: Diagnosis not present

## 2018-06-23 LAB — COMPREHENSIVE METABOLIC PANEL
AG Ratio: 1.4 (calc) (ref 1.0–2.5)
ALT: 18 U/L (ref 9–46)
AST: 23 U/L (ref 10–35)
Albumin: 4.2 g/dL (ref 3.6–5.1)
Alkaline phosphatase (APISO): 42 U/L (ref 35–144)
BUN: 13 mg/dL (ref 7–25)
CO2: 28 mmol/L (ref 20–32)
CREATININE: 1.14 mg/dL (ref 0.70–1.33)
Calcium: 9 mg/dL (ref 8.6–10.3)
Chloride: 102 mmol/L (ref 98–110)
Globulin: 3.1 g/dL (calc) (ref 1.9–3.7)
Glucose, Bld: 86 mg/dL (ref 65–99)
Potassium: 4.4 mmol/L (ref 3.5–5.3)
SODIUM: 137 mmol/L (ref 135–146)
TOTAL PROTEIN: 7.3 g/dL (ref 6.1–8.1)
Total Bilirubin: 0.4 mg/dL (ref 0.2–1.2)

## 2018-06-23 LAB — CBC WITH DIFFERENTIAL/PLATELET
Absolute Monocytes: 837 cells/uL (ref 200–950)
BASOS ABS: 32 {cells}/uL (ref 0–200)
Basophils Relative: 0.6 %
EOS ABS: 119 {cells}/uL (ref 15–500)
EOS PCT: 2.2 %
HEMATOCRIT: 39.1 % (ref 38.5–50.0)
HEMOGLOBIN: 13.6 g/dL (ref 13.2–17.1)
LYMPHS ABS: 2624 {cells}/uL (ref 850–3900)
MCH: 32.6 pg (ref 27.0–33.0)
MCHC: 34.8 g/dL (ref 32.0–36.0)
MCV: 93.8 fL (ref 80.0–100.0)
MPV: 10.3 fL (ref 7.5–12.5)
Monocytes Relative: 15.5 %
NEUTROS ABS: 1787 {cells}/uL (ref 1500–7800)
Neutrophils Relative %: 33.1 %
Platelets: 146 10*3/uL (ref 140–400)
RBC: 4.17 10*6/uL — ABNORMAL LOW (ref 4.20–5.80)
RDW: 13.3 % (ref 11.0–15.0)
Total Lymphocyte: 48.6 %
WBC: 5.4 10*3/uL (ref 3.8–10.8)

## 2018-06-23 LAB — URINALYSIS, ROUTINE W REFLEX MICROSCOPIC
Bacteria, UA: NONE SEEN /HPF
Bilirubin Urine: NEGATIVE
Glucose, UA: NEGATIVE
Hgb urine dipstick: NEGATIVE
KETONES UR: NEGATIVE
Leukocytes,Ua: NEGATIVE
NITRITE: NEGATIVE
SPECIFIC GRAVITY, URINE: 1.025 (ref 1.001–1.03)
SQUAMOUS EPITHELIAL / LPF: NONE SEEN /HPF (ref <=5)
WBC, UA: NONE SEEN /HPF (ref 0–5)
pH: 6 (ref 5.0–8.0)

## 2018-06-23 LAB — INFLUENZA A AND B AG, IMMUNOASSAY
INFLUENZA A ANTIGEN: NOT DETECTED
INFLUENZA B ANTIGEN: NOT DETECTED

## 2018-06-23 LAB — MICROSCOPIC MESSAGE

## 2018-06-23 MED ORDER — BENZONATATE 100 MG PO CAPS: 100.0000 mg | ORAL_CAPSULE | Freq: Two times a day (BID) | ORAL | 0 refills | Status: DC | PRN

## 2018-06-23 MED ORDER — LORATADINE 10 MG PO TABS: 10.0000 mg | ORAL_TABLET | Freq: Every day | ORAL | 1 refills | Status: DC | PRN

## 2018-06-23 NOTE — Progress Notes (Signed)
   Subjective:    Patient ID: Brandon Lawrence, male    DOB: June 23, 1960, 58 y.o.   MRN: [id number]  Patient presents for Illness (x4 days- HA, fever/ chills, N/V, cough)  Last Friday started with fever,body aches Saturday had the sweats and chills, headache, cough with a little congestion, vomiting, no diarrhea.  +sick contacts  States he took an entire bottle of tylenol. He felt his veins hurting on the side of his head.   He stopped taking iron tablets because of stomach pains    States urine has been strong and he had some low back pain a few weeks ago, would like kidney check.  no blood in urine     Review Of Systems:  GEN- denies fatigue, +fever, weight loss,weakness, recent illness HEENT- denies eye drainage, change in vision, nasal discharge, CVS- denies chest pain, palpitations RESP- denies SOB, +cough, wheeze ABD-+ N/V, change in stools, abd pain GU- denies dysuria, hematuria, dribbling, incontinence MSK- denies joint pain, muscle aches, injury Neuro- denies headache, dizziness, syncope, seizure activity       Objective:    BP 138/76   Pulse 62   Temp 98.6 F (37 C) (Oral)   Resp 14   Ht 5\' 6"  (1.676 m)   Wt 137 lb (62.1 kg)   SpO2 98%   BMI 22.11 kg/m  GEN- NAD, alert and oriented x3 HEENT- PERRL, EOMI, non injected sclera, pink conjunctiva, MMM, oropharynx clear, TM clear bilat no effusion, nares clear rhinorrhea  Neck- Supple, no LAD CVS- RRR, no murmur RESP-CTAB ABD-NABS,soft,NT,ND, no CVA tenderness EXT- No edema Pulses- Radial, DP- 2+        Assessment & Plan:      Problem List Items Addressed This Visit    None    Visit Diagnoses    Viral URI    -  Primary   Viral illness, flu neg, treat symptoms, tessalon , claritin, UA negative,except protein, check renal function, push fluids. advised no more tylenol, check liver function ensure he has not caused a chemical hepatitis   Relevant Orders   Influenza A and B Ag, Immunoassay (Completed)   Comprehensive metabolic panel   CBC with Differential/Platelet   Abnormal urine odor       Relevant Orders   Urinalysis, Routine w reflex microscopic (Completed)      Note: This dictation was prepared with Dragon dictation along with smaller phrase technology. Any transcriptional errors that result from this process are unintentional.

## 2018-06-23 NOTE — Patient Instructions (Addendum)
Take the tessalon perrles  Do not take any more tylenol for now  We will call with lab results  F/U as previous

## 2018-06-24 IMAGING — CT CT HEART MORP W/ CTA COR W/ SCORE W/ CA W/CM &/OR W/O CM
2 of 7 series · 8 of 20 positions shown, 10 images · IV contrast (APPLIED)
Comparison: chest radiograph [DATE]

CLINICAL DATA: 56-year-old male with atypical chest pain.

EXAM:
Cardiac/Coronary  CT
TECHNIQUE: The patient was scanned on a [employer] Force scanner.

[Series 6: ax thins · axial · 0.59mm/px · z∈[+790,+1254]mm · 5 of 699 slices shown, 7 images]
[im 117/699  vessel]
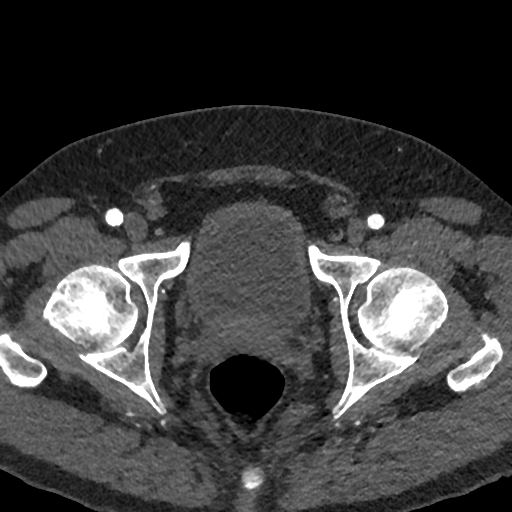
[im 117/699  lung]
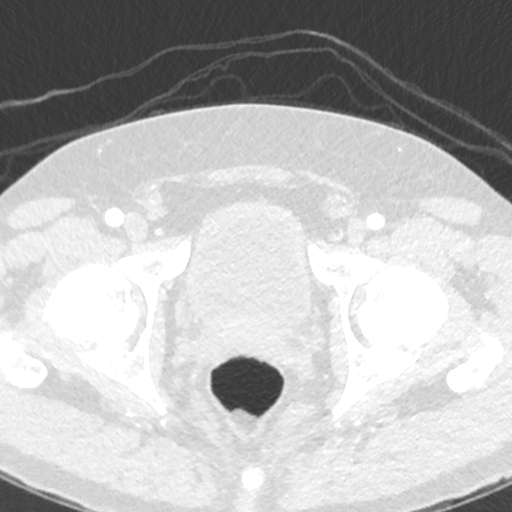
[im 233/699  vessel]
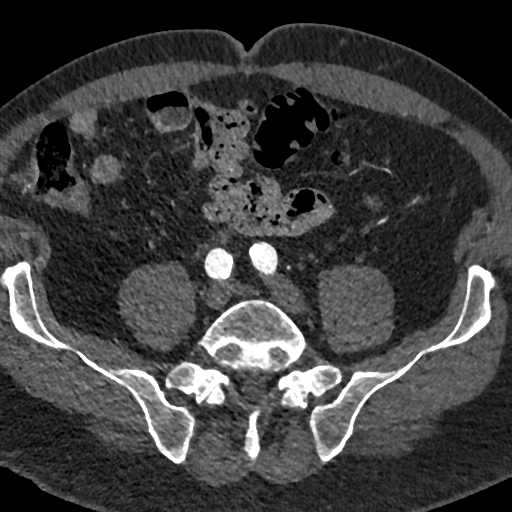
[im 350/699  vessel]
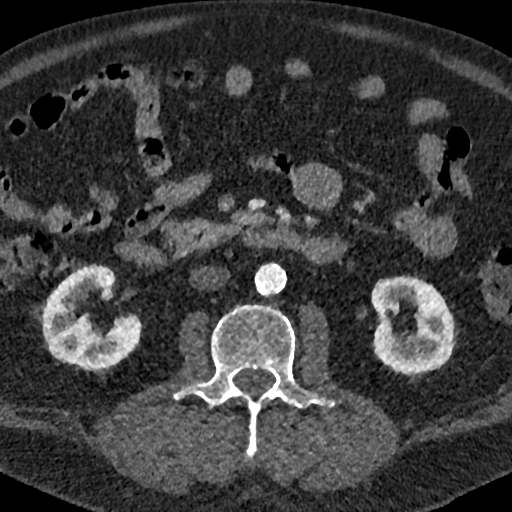
[im 466/699  vessel]
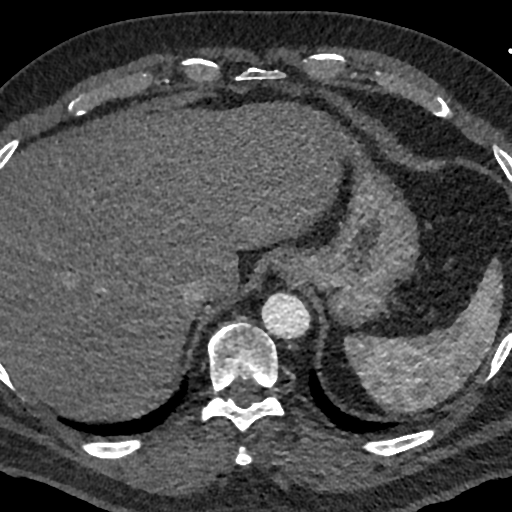
[im 582/699  vessel]
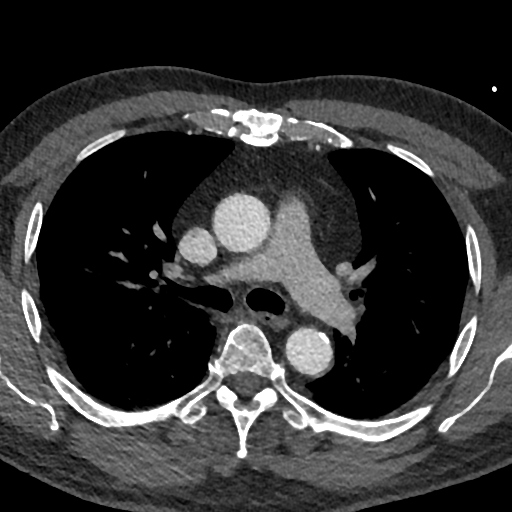
[im 582/699  lung]
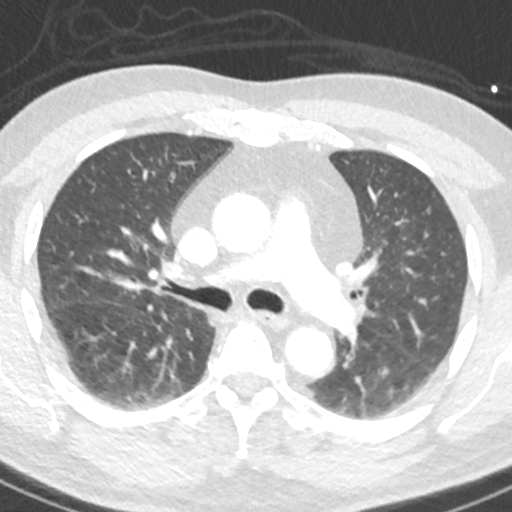

[Series 7: best syst 27 % · axial · 0.39mm/px · z∈[+1145,+1246]mm · 3 of 507 slices shown]
[im 127/507  vessel]
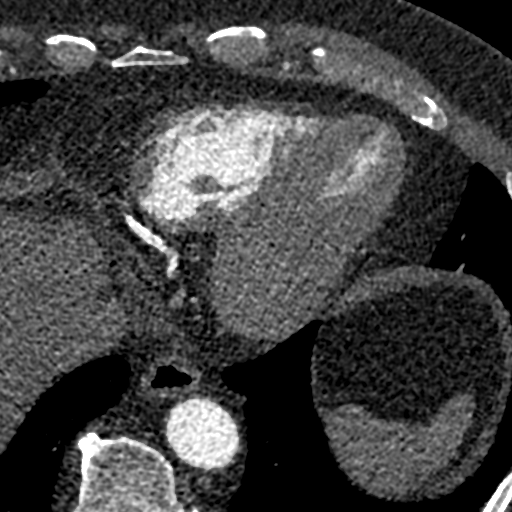
[im 254/507  vessel]
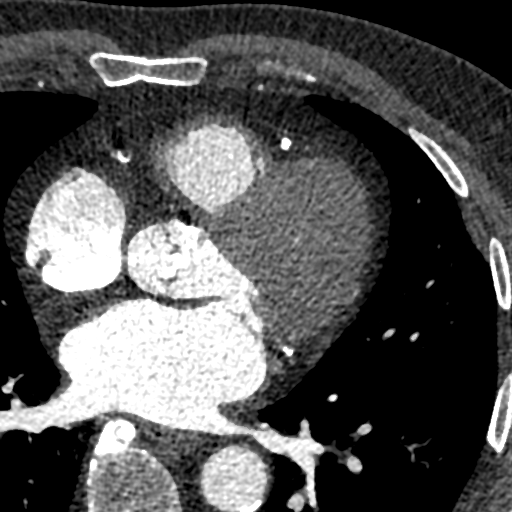
[im 380/507  vessel]
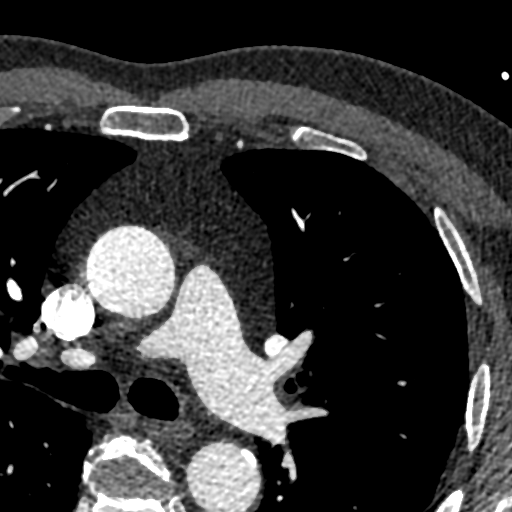

[8 of 20 positions shown; findings below may reference images not displayed]

FINDINGS: A 120 kV prospective scan was triggered in the descending thoracic
aorta at 111 HU's. Axial non-contrast 3 mm slices were carried out
through the heart. The data set was analyzed on a dedicated work
station and scored using the Agatson method. Gantry rotation speed
was 250 msecs and collimation was .6 mm. 5 mg iv metoprolol and
mg of sl NTG was given. The 3D data set was reconstructed in 5%
intervals of the 67-82 % of the R-R cycle. Diastolic phases were
analyzed on a dedicated work station using MPR, MIP and VRT modes.
The patient received 80 cc of contrast.

Aorta:  Normal size.  No calcifications.  No dissection.

Aortic Valve:  Trileaflet.  No calcifications.

Coronary Arteries:  Normal coronary origin.  Right dominance.

RCA is a large dominant artery that gives rise to PDA and PLVB.
There is no plaque.

Left main is a large artery that gives rise to LAD and LCX arteries.
LM has no plaque.

LAD is a large vessel that gives rise to two diagonal arteries.
Proximal LAD has a minimal calcified plaque with associated stenosis
0-25%. Mid LAD prior to takeoff of 2nd diagonal artery has moderate
non-calcified plaque with associated stenosis 50-69%. This is
followed by mild calcified plaque with associated stenosis 50%.
Distal LAD has no significant plaque.

D1 has no plaque.

D2 is small artery that has moderate ostial plaque with associated
stenosis at least 50-69%.

LCX is a non-dominant artery that gives rise to one OM1 branch.
There is minimal calcified plaque in the mid LCX artery with
associated stenosis 0-25%.

Other findings:

Normal pulmonary vein drainage into the left atrium.

Normal let atrial appendage without a thrombus.

Mildly dilated pulmonary artery measuring 31 mm.
IMPRESSION: 1. Coronary calcium score of 32. This was 65 percentile for age and
sex matched control.

2. Normal coronary origin with right dominance.

3. Moderate stenosis in the mid LAD and at least moderate stenosis
in a small second diagonal artery. Additional analysis with CT FFR
will be performed.

4. Mildly dilated pulmonary artery measuring 31 mm.

EXAM:
OVER-READ INTERPRETATION  CT CHEST

The following report is an over-read performed by radiologist Dr.
over-read does not include interpretation of cardiac or coronary
anatomy or pathology. The coronary CTA interpretation by the
cardiologist is attached.
FINDINGS: Limited view of the lung parenchyma demonstrates no suspicious
nodularity. Airways are normal.

Limited view of the mediastinum demonstrates no adenopathy.
Esophagus normal.

Limited view of the upper abdomen unremarkable.

Limited view of the skeleton and chest wall is unremarkable.
IMPRESSION: No significant extracardiac findings.

## 2018-07-23 ENCOUNTER — Other Ambulatory Visit: Payer: Self-pay | Admitting: Family Medicine

## 2018-07-28 ENCOUNTER — Ambulatory Visit (INDEPENDENT_AMBULATORY_CARE_PROVIDER_SITE_OTHER): Payer: Medicare PPO | Admitting: Family Medicine

## 2018-07-28 ENCOUNTER — Other Ambulatory Visit: Payer: Self-pay

## 2018-07-28 ENCOUNTER — Encounter: Payer: Self-pay | Admitting: Family Medicine

## 2018-07-28 VITALS — BP 130/68 | HR 80 | Temp 98.4°F | Resp 14 | Ht 66.0 in | Wt 137.0 lb

## 2018-07-28 DIAGNOSIS — Z0000 Encounter for general adult medical examination without abnormal findings: Secondary | ICD-10-CM | POA: Diagnosis not present

## 2018-07-28 DIAGNOSIS — F1210 Cannabis abuse, uncomplicated: Secondary | ICD-10-CM

## 2018-07-28 DIAGNOSIS — I10 Essential (primary) hypertension: Secondary | ICD-10-CM | POA: Diagnosis not present

## 2018-07-28 DIAGNOSIS — J301 Allergic rhinitis due to pollen: Secondary | ICD-10-CM | POA: Diagnosis not present

## 2018-07-28 DIAGNOSIS — M1711 Unilateral primary osteoarthritis, right knee: Secondary | ICD-10-CM | POA: Diagnosis not present

## 2018-07-28 DIAGNOSIS — Z125 Encounter for screening for malignant neoplasm of prostate: Secondary | ICD-10-CM | POA: Diagnosis not present

## 2018-07-28 DIAGNOSIS — D508 Other iron deficiency anemias: Secondary | ICD-10-CM

## 2018-07-28 DIAGNOSIS — F330 Major depressive disorder, recurrent, mild: Secondary | ICD-10-CM

## 2018-07-28 DIAGNOSIS — N400 Enlarged prostate without lower urinary tract symptoms: Secondary | ICD-10-CM | POA: Diagnosis not present

## 2018-07-28 DIAGNOSIS — Z23 Encounter for immunization: Secondary | ICD-10-CM

## 2018-07-28 DIAGNOSIS — Z0001 Encounter for general adult medical examination with abnormal findings: Secondary | ICD-10-CM | POA: Diagnosis not present

## 2018-07-28 DIAGNOSIS — Z85528 Personal history of other malignant neoplasm of kidney: Secondary | ICD-10-CM

## 2018-07-28 DIAGNOSIS — K219 Gastro-esophageal reflux disease without esophagitis: Secondary | ICD-10-CM

## 2018-07-28 MED ORDER — DICLOFENAC SODIUM 1 % TD GEL: TRANSDERMAL | 1 refills | Status: DC

## 2018-07-28 MED ORDER — LINACLOTIDE 72 MCG PO CAPS: ORAL_CAPSULE | ORAL | 3 refills | Status: DC

## 2018-07-28 MED ORDER — DEXLANSOPRAZOLE 60 MG PO CPDR: DELAYED_RELEASE_CAPSULE | ORAL | 2 refills | Status: DC

## 2018-07-28 NOTE — Progress Notes (Addendum)
Subjective:   Patient presents for Medicare Annual/Subsequent preventive examination.    Pt here for wellness exam Feeling good   He had 1 fall, occ gets a pain that feels a pain that shoots and goes numb and gives out on him and he trips  Uses a cane to walk   Depression screening positive- states when he is sitting around the house by himself, he gets lonely and starts to think about all the things  his body wont let him he do, such as play with grandchildren , he doesn't feel sad all the time. He is taking remeron, he is in a relationship and that brings him joy. He does live with this mother  He is worried about the virus    Still taking remeron, weight has been stable at 137, continues to smoke marijuana     Anemia- does not take regulary, because it causes GI upset and constipation    Review Past Medical/Family/Social: Per EMR   Risk Factors  Current exercise habits: None Dietary issues discussed: Yes  Cardiac risk factors:   Depression Screen  - PHQ 9 score 13/GAD 10 (Note: if answer to either of the following is "Yes", a more complete depression screening is indicated)  Over the past two weeks, have you felt down, depressed or hopeless? Yes Over the past two weeks, have you felt little interest or pleasure in doing things? No Have you lost interest or pleasure in daily life? No Do you often feel hopeless? No Do you cry easily over simple problems? No   Activities of Daily Living  In your present state of health, do you have any difficulty performing the following activities?:  Driving? Yes Managing money? No  Feeding yourself? No  Getting from bed to chair? No  Climbing a flight of stairs? Yes  Preparing food and eating?: No  Bathing or showering? No  Getting dressed: No  Getting to the toilet? No  Using the toilet:No  Moving around from place to place: Yes  In the past year have you fallen or had a near fall?:Yes  Are you sexually active? No  Do you have  more than one partner? No   Hearing Difficulties: No  Do you often ask people to speak up or repeat themselves? No  Do you experience ringing or noises in your ears? No Do you have difficulty understanding soft or whispered voices? No  Do you feel that you have a problem with memory? Yes Do you often misplace items? No  Do you feel safe at home? Yes  Cognitive Testing  Alert? Yes Normal Appearance?Yes  Oriented to person? Yes Place? Yes  Time? Yes  Recall of three objects? Yes  Can perform simple calculations? Yes  Displays appropriate judgment?Yes  Can read the correct time from a watch face?Yes   List the Names of Other Physician/Practitioners you currently use:  GI- [facility]   Screening Tests / Date Colonoscopy UTD                    Zostavax Due HIV- neg/ Hep C neg Influenza Vaccine DUE Tetanus/tdap- UTD  ROS: GEN- denies fatigue, fever, weight loss,weakness, recent illness HEENT- denies eye drainage, change in vision, nasal discharge, CVS- denies chest pain, palpitations RESP- denies SOB, cough, wheeze ABD- denies N/V, change in stools, abd pain GU- denies dysuria, hematuria, dribbling, incontinence MSK- denies joint pain, muscle aches, injury Neuro- denies headache, dizziness, syncope, seizure activity   Physical: vitals reviewed GEN-  NAD, alert and oriented x3,walks with cane HEENT- PERRL, EOMI, non injected sclera, pink conjunctiva, MMM, oropharynx clear Neck- Supple, no thryomegaly CVS- RRR, no murmur RESP-CTAB ABD-NABS,soft,NT,ND Psych- normal affect and mood  EXT- No edema Pulses- Radial, DP- 2+  AUDIT C screen negative  Assessment:    Annual wellness medicare exam   Plan:    During the course of the visit the patient was educated and counseled about appropriate screening and preventive services including:  Flu vaccine given Shingles vaccine. Prescription given to that she can get the vaccine at the pharmacy or Medicare part D.  MDD-  taking remeron, discussed getting a hobby so he is not sitting around with nothing to do, also discussed his marijuana use which is ongoing and leads to his GI pain  History of RCC- f/u urology yearly PSA screening done   GERD- refilled dexilant  Seasonal allergies- anti-histamine prn     Diet review for nutrition referral? Yes ____ Not Indicated __x__  Patient Instructions (the written plan) was given to the patient.  Medicare Attestation  I have personally reviewed:  The patient's medical and social history  Their use of alcohol, tobacco or illicit drugs  Their current medications and supplements  The patient's functional ability including ADLs,fall risks, home safety risks, cognitive, and hearing and visual impairment  Diet and physical activities  Evidence for depression or mood disorders  The patient's weight, height, BMI, and visual acuity have been recorded in the chart. I have made referrals, counseling, and provided education to the patient based on review of the above and I have provided the patient with a written personalized care plan for preventive services.

## 2018-07-28 NOTE — Patient Instructions (Addendum)
Shingrix sent to pharmacy Flu shot given  Restart dexilant zofran for nausea voltaren gel for joints  F/U 4 months

## 2018-07-29 ENCOUNTER — Telehealth: Payer: Self-pay | Admitting: *Deleted

## 2018-07-29 LAB — COMPREHENSIVE METABOLIC PANEL
AG Ratio: 1.3 (calc) (ref 1.0–2.5)
ALT: 18 U/L (ref 9–46)
AST: 22 U/L (ref 10–35)
Albumin: 4 g/dL (ref 3.6–5.1)
Alkaline phosphatase (APISO): 40 U/L (ref 35–144)
BUN: 12 mg/dL (ref 7–25)
CO2: 26 mmol/L (ref 20–32)
Calcium: 9.2 mg/dL (ref 8.6–10.3)
Chloride: 110 mmol/L (ref 98–110)
Creat: 1.23 mg/dL (ref 0.70–1.33)
GLUCOSE: 81 mg/dL (ref 65–99)
Globulin: 3 g/dL (calc) (ref 1.9–3.7)
Potassium: 4.9 mmol/L (ref 3.5–5.3)
Sodium: 140 mmol/L (ref 135–146)
Total Bilirubin: 0.4 mg/dL (ref 0.2–1.2)
Total Protein: 7 g/dL (ref 6.1–8.1)

## 2018-07-29 LAB — LIPID PANEL
Cholesterol: 144 mg/dL (ref <200)
HDL: 41 mg/dL (ref >=40)
LDL CHOLESTEROL (CALC): 88 mg/dL
NON-HDL CHOLESTEROL (CALC): 103 mg/dL (ref <130)
Total CHOL/HDL Ratio: 3.5 (calc) (ref <5.0)
Triglycerides: 64 mg/dL (ref <150)

## 2018-07-29 LAB — CBC WITH DIFFERENTIAL/PLATELET
Absolute Monocytes: 570 cells/uL (ref 200–950)
Basophils Absolute: 30 cells/uL (ref 0–200)
Basophils Relative: 0.4 %
Eosinophils Absolute: 308 cells/uL (ref 15–500)
Eosinophils Relative: 4.1 %
HCT: 37.4 % — ABNORMAL LOW (ref 38.5–50.0)
Hemoglobin: 12.2 g/dL — ABNORMAL LOW (ref 13.2–17.1)
Lymphs Abs: 3473 cells/uL (ref 850–3900)
MCH: 30.7 pg (ref 27.0–33.0)
MCHC: 32.6 g/dL (ref 32.0–36.0)
MCV: 94 fL (ref 80.0–100.0)
MPV: 10.9 fL (ref 7.5–12.5)
Monocytes Relative: 7.6 %
Neutro Abs: 3120 cells/uL (ref 1500–7800)
Neutrophils Relative %: 41.6 %
Platelets: 187 10*3/uL (ref 140–400)
RBC: 3.98 10*6/uL — ABNORMAL LOW (ref 4.20–5.80)
RDW: 13.2 % (ref 11.0–15.0)
Total Lymphocyte: 46.3 %
WBC: 7.5 10*3/uL (ref 3.8–10.8)

## 2018-07-29 LAB — IRON,TIBC AND FERRITIN PANEL
%SAT: 30 % (calc) (ref 20–48)
Ferritin: 219 ng/mL (ref 38–380)
Iron: 93 ug/dL (ref 50–180)
TIBC: 310 mcg/dL (calc) (ref 250–425)

## 2018-07-29 LAB — PSA: PSA: 1.1 ng/mL (ref <=4.0)

## 2018-07-29 MED ORDER — ZOSTER VAC RECOMB ADJUVANTED 50 MCG/0.5ML IM SUSR: 0.5000 mL | Freq: Once | INTRAMUSCULAR | 1 refills | Status: AC

## 2018-07-29 NOTE — Telephone Encounter (Signed)
Prescription sent to pharmacy.

## 2018-07-29 NOTE — Telephone Encounter (Signed)
-----   Message from Alycia Rossetti, MD sent at 07/28/2018  8:42 PM EDT ----- Regarding: Send shingrix to pharmacy

## 2018-07-30 ENCOUNTER — Encounter: Payer: Self-pay | Admitting: *Deleted

## 2018-09-29 ENCOUNTER — Ambulatory Visit (INDEPENDENT_AMBULATORY_CARE_PROVIDER_SITE_OTHER): Payer: Medicare PPO | Admitting: Family Medicine

## 2018-09-29 ENCOUNTER — Encounter: Payer: Self-pay | Admitting: Family Medicine

## 2018-09-29 ENCOUNTER — Other Ambulatory Visit: Payer: Self-pay

## 2018-09-29 VITALS — BP 136/76 | HR 70 | Temp 98.7°F | Resp 18 | Ht 64.0 in | Wt 131.6 lb

## 2018-09-29 DIAGNOSIS — Q845 Enlarged and hypertrophic nails: Secondary | ICD-10-CM

## 2018-09-29 DIAGNOSIS — M79674 Pain in right toe(s): Secondary | ICD-10-CM | POA: Diagnosis not present

## 2018-09-29 DIAGNOSIS — R109 Unspecified abdominal pain: Secondary | ICD-10-CM | POA: Diagnosis not present

## 2018-09-29 DIAGNOSIS — G8929 Other chronic pain: Secondary | ICD-10-CM

## 2018-09-29 MED ORDER — ONDANSETRON HCL 4 MG PO TABS: 4.0000 mg | ORAL_TABLET | Freq: Three times a day (TID) | ORAL | 1 refills | Status: DC | PRN

## 2018-09-29 MED ORDER — MIRTAZAPINE 15 MG PO TABS: ORAL_TABLET | ORAL | 2 refills | Status: DC

## 2018-09-29 NOTE — Assessment & Plan Note (Signed)
Chronic abdominal pain with continued increase use of marijuana.  Recommend he call his gastroenterologist.  I did send in Zofran again as well as the mirtazapine which is helping his appetite.  We verify that this is at the pharmacy.

## 2018-09-29 NOTE — Patient Instructions (Addendum)
Call your GI doctor  F/u AS PREVIOUS

## 2018-09-29 NOTE — Progress Notes (Signed)
   Subjective:    Patient ID: Brandon Lawrence, male    DOB: 03/04/1961, 58 y.o.   MRN: [id number]  Patient presents for Hangnail (little toe on R foot)   Pt here with pain Right 5th digit, has had pain for a couple years. Now so painful he can't get his regular shoe on.  No drainage from nail.   Continues to have GI upset, Smoking more marijuana, not eating as much because of the nausea and abdominal discomfort.  Weight down 6lbs more since March.  He states he is not on the remeron and also doesn't have the zofran  He has not called his gastroenterologist back.  Review Of Systems:  GEN- denies fatigue, fever, weight loss,weakness, recent illness HEENT- denies eye drainage, change in vision, nasal discharge, CVS- denies chest pain, palpitations RESP- denies SOB, cough, wheeze ABD- + N/ deniesV, change in stools, +abd pain GU- denies dysuria, hematuria, dribbling, incontinence MSK- denies joint pain, muscle aches, injury Neuro- denies headache, dizziness, syncope, seizure activity       Objective:    BP 136/76   Pulse 70   Temp 98.7 F (37.1 C)   Resp 18   Ht 5\' 4"  (1.626 m)   Wt 131 lb 9.6 oz (59.7 kg)   SpO2 94%   BMI 22.59 kg/m  GEN- NAD, alert and oriented x3 HEENT- PERRL, EOMI, non injected sclera, pink conjunctiva, MMM, oropharynx clear CVS- RRR, no murmur RESP-CTAB ABD-NABS,soft,NT,ND EXT- No edema- Right 5th digit, mild swelling base of nail, hypertrophic , other nails thickened Pulses- Radial, DP- 2+        Assessment & Plan:      Problem List Items Addressed This Visit      Unprioritized   Chronic abdominal pain    Chronic abdominal pain with continued increase use of marijuana.  Recommend he call his gastroenterologist.  I did send in Zofran again as well as the mirtazapine which is helping his appetite.  We verify that this is at the pharmacy.      Relevant Medications   mirtazapine (REMERON) 15 MG tablet    Other Visit Diagnoses    Enlarged  and hypertrophic nails    -  Primary   Relevant Orders   Ambulatory referral to Podiatry   Pain of toe of right foot       Relevant Orders   Ambulatory referral to Podiatry      Note: This dictation was prepared with Dragon dictation along with smaller phrase technology. Any transcriptional errors that result from this process are unintentional.

## 2018-10-06 ENCOUNTER — Encounter: Payer: Self-pay | Admitting: Sports Medicine

## 2018-10-06 ENCOUNTER — Ambulatory Visit: Payer: Medicare PPO | Admitting: Sports Medicine

## 2018-10-06 ENCOUNTER — Other Ambulatory Visit: Payer: Self-pay

## 2018-10-06 VITALS — BP 110/57 | HR 51 | Temp 98.1°F | Resp 16

## 2018-10-06 DIAGNOSIS — L600 Ingrowing nail: Secondary | ICD-10-CM

## 2018-10-06 DIAGNOSIS — L603 Nail dystrophy: Secondary | ICD-10-CM | POA: Diagnosis not present

## 2018-10-06 DIAGNOSIS — M79674 Pain in right toe(s): Secondary | ICD-10-CM | POA: Diagnosis not present

## 2018-10-06 MED ORDER — NEOMYCIN-POLYMYXIN-HC 3.5-10000-1 OT SOLN: OTIC | 0 refills | Status: DC

## 2018-10-06 NOTE — Patient Instructions (Signed)
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
## 2018-10-21 ENCOUNTER — Other Ambulatory Visit: Payer: Self-pay | Admitting: Family Medicine

## 2018-10-27 ENCOUNTER — Other Ambulatory Visit: Payer: Self-pay

## 2018-10-27 ENCOUNTER — Ambulatory Visit: Payer: Medicare PPO | Admitting: Sports Medicine

## 2018-10-27 ENCOUNTER — Encounter: Payer: Self-pay | Admitting: Sports Medicine

## 2018-10-27 VITALS — Temp 98.7°F

## 2018-10-27 DIAGNOSIS — Z9889 Other specified postprocedural states: Secondary | ICD-10-CM

## 2018-10-27 DIAGNOSIS — M79674 Pain in right toe(s): Secondary | ICD-10-CM
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
## 2018-10-27 NOTE — Patient Instructions (Signed)

## 2018-11-27 ENCOUNTER — Encounter: Payer: Self-pay | Admitting: Family Medicine

## 2018-11-27 ENCOUNTER — Ambulatory Visit (INDEPENDENT_AMBULATORY_CARE_PROVIDER_SITE_OTHER): Payer: Medicare PPO | Admitting: Family Medicine

## 2018-11-27 ENCOUNTER — Other Ambulatory Visit: Payer: Self-pay

## 2018-11-27 VITALS — BP 128/68 | HR 62 | Temp 98.4°F | Resp 14 | Ht 64.0 in | Wt 132.0 lb

## 2018-11-27 DIAGNOSIS — I10 Essential (primary) hypertension: Secondary | ICD-10-CM

## 2018-11-27 DIAGNOSIS — K219 Gastro-esophageal reflux disease without esophagitis: Secondary | ICD-10-CM

## 2018-11-27 DIAGNOSIS — R2681 Unsteadiness on feet: Secondary | ICD-10-CM | POA: Diagnosis not present

## 2018-11-27 DIAGNOSIS — H5461 Unqualified visual loss, right eye, normal vision left eye: Secondary | ICD-10-CM

## 2018-11-27 DIAGNOSIS — G43B0 Ophthalmoplegic migraine, not intractable: Secondary | ICD-10-CM | POA: Diagnosis not present

## 2018-11-27 DIAGNOSIS — G43909 Migraine, unspecified, not intractable, without status migrainosus: Secondary | ICD-10-CM | POA: Insufficient documentation

## 2018-11-27 DIAGNOSIS — E441 Mild protein-calorie malnutrition: Secondary | ICD-10-CM

## 2018-11-27 MED ORDER — SUMATRIPTAN SUCCINATE 100 MG PO TABS: 100.0000 mg | ORAL_TABLET | ORAL | 1 refills | Status: DC | PRN
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
## 2018-11-27 NOTE — Assessment & Plan Note (Signed)
He has been maintaining his weight with the mirtazapine.

## 2018-11-27 NOTE — Assessment & Plan Note (Signed)
Walks with cane.  Has a chronic limp.  I did fill out his handicap placard

## 2018-11-27 NOTE — Assessment & Plan Note (Signed)
Headaches or decreased vision on the right side from an old injury.  There is no otherwise focal changes when he has headaches.  I will give him Imitrex to have on hand he can try this he has been taking Tylenol.  We will also get him appointment with ophthalmology.

## 2018-11-27 NOTE — Assessment & Plan Note (Signed)
Blood pressure is controlled no changes to medication. °

## 2018-11-27 NOTE — Patient Instructions (Addendum)
F/U 6 months  Referral to eye doctor Use imitrex for severe headaches

## 2018-12-02 DIAGNOSIS — H31011 Macula scars of posterior pole (postinflammatory) (post-traumatic), right eye: Secondary | ICD-10-CM | POA: Diagnosis not present

## 2018-12-02 DIAGNOSIS — H35361 Drusen (degenerative) of macula, right eye: Secondary | ICD-10-CM | POA: Diagnosis not present

## 2018-12-02 DIAGNOSIS — H35013 Changes in retinal vascular appearance, bilateral: Secondary | ICD-10-CM | POA: Diagnosis not present

## 2018-12-02 DIAGNOSIS — H2513 Age-related nuclear cataract, bilateral: Secondary | ICD-10-CM | POA: Diagnosis not present

## 2018-12-02 DIAGNOSIS — H25013 Cortical age-related cataract, bilateral: Secondary | ICD-10-CM | POA: Diagnosis not present

## 2018-12-09 ENCOUNTER — Encounter: Payer: Self-pay | Admitting: Internal Medicine

## 2019-01-19 ENCOUNTER — Other Ambulatory Visit: Payer: Self-pay | Admitting: Family Medicine

## 2019-03-03 ENCOUNTER — Other Ambulatory Visit: Payer: Self-pay | Admitting: *Deleted

## 2019-03-03 MED ORDER — PROMETHAZINE HCL 12.5 MG PO TABS: 12.5000 mg | ORAL_TABLET | Freq: Three times a day (TID) | ORAL | 1 refills | Status: DC | PRN

## 2019-03-03 MED ORDER — DEXILANT 60 MG PO CPDR: DELAYED_RELEASE_CAPSULE | ORAL | 2 refills | Status: DC

## 2019-03-03 MED ORDER — LINACLOTIDE 72 MCG PO CAPS: ORAL_CAPSULE | ORAL | 3 refills | Status: DC

## 2019-04-19 ENCOUNTER — Other Ambulatory Visit: Payer: Self-pay | Admitting: Family Medicine

## 2019-04-19 MED ORDER — DOXAZOSIN MESYLATE 4 MG PO TABS: 4.0000 mg | ORAL_TABLET | Freq: Every day | ORAL | 0 refills | Status: DC

## 2019-04-20 ENCOUNTER — Other Ambulatory Visit: Payer: Self-pay | Admitting: *Deleted

## 2019-04-20 MED ORDER — SUCRALFATE 1 G PO TABS: ORAL_TABLET | ORAL | 2 refills | Status: DC

## 2019-04-20 MED ORDER — DULOXETINE HCL 30 MG PO CPEP: 30.0000 mg | ORAL_CAPSULE | Freq: Every day | ORAL | 0 refills | Status: DC

## 2019-05-31 ENCOUNTER — Ambulatory Visit (INDEPENDENT_AMBULATORY_CARE_PROVIDER_SITE_OTHER): Payer: Medicare PPO | Admitting: Family Medicine

## 2019-05-31 ENCOUNTER — Encounter: Payer: Self-pay | Admitting: Family Medicine

## 2019-05-31 ENCOUNTER — Other Ambulatory Visit: Payer: Self-pay

## 2019-05-31 VITALS — BP 136/82 | HR 60 | Temp 98.3°F | Resp 16 | Ht 64.0 in | Wt 137.2 lb

## 2019-05-31 DIAGNOSIS — R109 Unspecified abdominal pain: Secondary | ICD-10-CM | POA: Diagnosis not present

## 2019-05-31 DIAGNOSIS — K219 Gastro-esophageal reflux disease without esophagitis: Secondary | ICD-10-CM

## 2019-05-31 DIAGNOSIS — R2681 Unsteadiness on feet: Secondary | ICD-10-CM

## 2019-05-31 DIAGNOSIS — Z85528 Personal history of other malignant neoplasm of kidney: Secondary | ICD-10-CM | POA: Diagnosis not present

## 2019-05-31 DIAGNOSIS — E441 Mild protein-calorie malnutrition: Secondary | ICD-10-CM | POA: Diagnosis not present

## 2019-05-31 DIAGNOSIS — N400 Enlarged prostate without lower urinary tract symptoms: Secondary | ICD-10-CM

## 2019-05-31 DIAGNOSIS — G8929 Other chronic pain: Secondary | ICD-10-CM

## 2019-05-31 DIAGNOSIS — I10 Essential (primary) hypertension: Secondary | ICD-10-CM

## 2019-05-31 LAB — CBC WITH DIFFERENTIAL/PLATELET
Absolute Monocytes: 855 cells/uL (ref 200–950)
Basophils Absolute: 29 cells/uL (ref 0–200)
Basophils Relative: 0.3 %
Eosinophils Absolute: 466 cells/uL (ref 15–500)
Eosinophils Relative: 4.9 %
HCT: 35.5 % — ABNORMAL LOW (ref 38.5–50.0)
Hemoglobin: 11.9 g/dL — ABNORMAL LOW (ref 13.2–17.1)
Lymphs Abs: 4228 cells/uL — ABNORMAL HIGH (ref 850–3900)
MCH: 32.3 pg (ref 27.0–33.0)
MCHC: 33.5 g/dL (ref 32.0–36.0)
MCV: 96.5 fL (ref 80.0–100.0)
MPV: 10.8 fL (ref 7.5–12.5)
Monocytes Relative: 9 %
Neutro Abs: 3924 cells/uL (ref 1500–7800)
Neutrophils Relative %: 41.3 %
Platelets: 185 10*3/uL (ref 140–400)
RBC: 3.68 10*6/uL — ABNORMAL LOW (ref 4.20–5.80)
RDW: 13.3 % (ref 11.0–15.0)
Total Lymphocyte: 44.5 %
WBC: 9.5 10*3/uL (ref 3.8–10.8)

## 2019-05-31 LAB — COMPREHENSIVE METABOLIC PANEL
AG Ratio: 1.4 (calc) (ref 1.0–2.5)
ALT: 15 U/L (ref 9–46)
AST: 18 U/L (ref 10–35)
Albumin: 4 g/dL (ref 3.6–5.1)
Alkaline phosphatase (APISO): 36 U/L (ref 35–144)
BUN: 21 mg/dL (ref 7–25)
CO2: 28 mmol/L (ref 20–32)
Calcium: 9.4 mg/dL (ref 8.6–10.3)
Chloride: 109 mmol/L (ref 98–110)
Creat: 1.17 mg/dL (ref 0.70–1.33)
Globulin: 2.9 g/dL (calc) (ref 1.9–3.7)
Glucose, Bld: 82 mg/dL (ref 65–99)
Potassium: 4.6 mmol/L (ref 3.5–5.3)
Sodium: 140 mmol/L (ref 135–146)
Total Bilirubin: 0.3 mg/dL (ref 0.2–1.2)
Total Protein: 6.9 g/dL (ref 6.1–8.1)

## 2019-05-31 LAB — LIPID PANEL
Cholesterol: 161 mg/dL (ref <200)
HDL: 45 mg/dL (ref >=40)
LDL Cholesterol (Calc): 100 mg/dL (calc) — ABNORMAL HIGH
Non-HDL Cholesterol (Calc): 116 mg/dL (calc) (ref <130)
Total CHOL/HDL Ratio: 3.6 (calc) (ref <5.0)
Triglycerides: 74 mg/dL (ref <150)

## 2019-05-31 MED ORDER — SUMATRIPTAN SUCCINATE 100 MG PO TABS: 100.0000 mg | ORAL_TABLET | ORAL | 1 refills | Status: DC | PRN

## 2019-05-31 MED ORDER — PROMETHAZINE HCL 12.5 MG PO TABS: 12.5000 mg | ORAL_TABLET | Freq: Three times a day (TID) | ORAL | 1 refills | Status: DC | PRN

## 2019-05-31 NOTE — Assessment & Plan Note (Signed)
We will obtain his last urology note.  It has been over 5 years since his cancer he likely does not need any further imaging but he missed his appointment last year

## 2019-05-31 NOTE — Assessment & Plan Note (Signed)
Controlled with Cardura.

## 2019-05-31 NOTE — Patient Instructions (Addendum)
F/U 4 months for Physical Dr Cannon Kettle [phone]

## 2019-05-31 NOTE — Progress Notes (Signed)
   Subjective:    Patient ID: Brandon Lawrence, male    DOB: 04/06/1961, 59 y.o.   MRN: [id number]  Patient presents for Hypertension and Follow-up (medications)  Is here to follow-up chronic medical problems.  Medications reviewed. Chronic nausea GERD-he is still on Carafate as well as his Dexilant as prescribed.  Constipation continues on Linzess without any difficulty  Hypertension controlled with Cardura  Iron deficiency anemia he is not on iron therapy because it worsens GI upset he is due for repeat CBC  He now has pain in 5th digit on left foot and his nail.  He had nail surgery back inJune 2020 on right foot, that has improved  Is due for labs today.  He is unsure when he was last seen by Urology for history of Left RCC, s/p partial nephrectomy   States he received COVID shot   Review Of Systems:  GEN- denies fatigue, fever, weight loss,weakness, recent illness HEENT- denies eye drainage, change in vision, nasal discharge, CVS- denies chest pain, palpitations RESP- denies SOB, cough, wheeze ABD- denies N/V, change in stools, abd pain GU- denies dysuria, hematuria, dribbling, incontinence MSK- denies joint pain, muscle aches, injury Neuro- denies headache, dizziness, syncope, seizure activity       Objective:    BP 136/82   Pulse 60   Temp 98.3 F (36.8 C) (Temporal)   Resp 16   Ht 5\' 4"  (1.626 m)   Wt 137 lb 4 oz (62.3 kg)   SpO2 95%   BMI 23.56 kg/m  GEN- NAD, alert and oriented x3 HEENT- PERRL, EOMI, non injected sclera, pink conjunctiva, MMM, oropharynx clear Neck- Supple, no thyromegaly CVS- RRR, no murmur RESP-CTAB ABD-NABS,soft,NT,ND EXT- No edema, left foot ingrown nail with callus. Pulses- Radial, DP- 2+        Assessment & Plan:    Patient given information to schedule with his podiatrist for the nail issue Problem List Items Addressed This Visit      Unprioritized   Benign prostatic hyperplasia    Controlled with Cardura.      Chronic abdominal pain    Controlled with reflux medications as well as antiemetics as needed Carafate      Essential hypertension, benign - Primary    Blood pressure controlled. Fasting labs to be done today.      Relevant Orders   CBC with Differential/Platelet   Comprehensive metabolic panel   Lipid panel   Gait instability   GERD (gastroesophageal reflux disease)    Continue current medications.      H/O renal cell carcinoma    We will obtain his last urology note.  It has been over 5 years since his cancer he likely does not need any further imaging but he missed his appointment last year      Protein-calorie malnutrition ([place])    Continue mirtazapine his weight is actually up 5 pounds.      Relevant Orders   Comprehensive metabolic panel      Note: This dictation was prepared with Dragon dictation along with smaller phrase technology. Any transcriptional errors that result from this process are unintentional.

## 2019-05-31 NOTE — Assessment & Plan Note (Signed)
Continue mirtazapine his weight is actually up 5 pounds.

## 2019-05-31 NOTE — Assessment & Plan Note (Signed)
Blood pressure controlled. Fasting labs to be done today.

## 2019-05-31 NOTE — Assessment & Plan Note (Signed)
Controlled with reflux medications as well as antiemetics as needed Carafate

## 2019-05-31 NOTE — Assessment & Plan Note (Signed)
Continue current medications. 

## 2019-06-03 DIAGNOSIS — Z1159 Encounter for screening for other viral diseases: Secondary | ICD-10-CM | POA: Diagnosis not present

## 2019-06-08 ENCOUNTER — Ambulatory Visit (INDEPENDENT_AMBULATORY_CARE_PROVIDER_SITE_OTHER): Payer: Medicare PPO | Admitting: Sports Medicine

## 2019-06-08 ENCOUNTER — Encounter: Payer: Self-pay | Admitting: Sports Medicine

## 2019-06-08 ENCOUNTER — Other Ambulatory Visit: Payer: Self-pay

## 2019-06-08 VITALS — Temp 98.1°F

## 2019-06-08 DIAGNOSIS — L84 Corns and callosities: Secondary | ICD-10-CM | POA: Diagnosis not present

## 2019-06-08 DIAGNOSIS — M79674 Pain in right toe(s): Secondary | ICD-10-CM

## 2019-06-08 DIAGNOSIS — M79675 Pain in left toe(s): Secondary | ICD-10-CM | POA: Diagnosis not present

## 2019-06-08 DIAGNOSIS — L603 Nail dystrophy: Secondary | ICD-10-CM

## 2019-06-08 DIAGNOSIS — M2042 Other hammer toe(s) (acquired), left foot: Secondary | ICD-10-CM

## 2019-06-08 DIAGNOSIS — M2041 Other hammer toe(s) (acquired), right foot: Secondary | ICD-10-CM

## 2019-06-08 NOTE — Patient Instructions (Signed)
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
## 2019-07-14 ENCOUNTER — Other Ambulatory Visit: Payer: Self-pay | Admitting: Family Medicine

## 2019-07-18 ENCOUNTER — Other Ambulatory Visit: Payer: Self-pay | Admitting: Family Medicine

## 2019-09-28 ENCOUNTER — Ambulatory Visit (INDEPENDENT_AMBULATORY_CARE_PROVIDER_SITE_OTHER): Payer: Medicare PPO | Admitting: Family Medicine

## 2019-09-28 ENCOUNTER — Other Ambulatory Visit: Payer: Self-pay

## 2019-09-28 ENCOUNTER — Encounter: Payer: Self-pay | Admitting: Family Medicine

## 2019-09-28 VITALS — BP 138/78 | HR 70 | Temp 97.8°F | Resp 16 | Ht 64.0 in | Wt 126.0 lb

## 2019-09-28 DIAGNOSIS — Z125 Encounter for screening for malignant neoplasm of prostate: Secondary | ICD-10-CM | POA: Diagnosis not present

## 2019-09-28 DIAGNOSIS — M25511 Pain in right shoulder: Secondary | ICD-10-CM | POA: Diagnosis not present

## 2019-09-28 DIAGNOSIS — G4733 Obstructive sleep apnea (adult) (pediatric): Secondary | ICD-10-CM | POA: Diagnosis not present

## 2019-09-28 DIAGNOSIS — R109 Unspecified abdominal pain: Secondary | ICD-10-CM | POA: Diagnosis not present

## 2019-09-28 DIAGNOSIS — F330 Major depressive disorder, recurrent, mild: Secondary | ICD-10-CM

## 2019-09-28 DIAGNOSIS — F1290 Cannabis use, unspecified, uncomplicated: Secondary | ICD-10-CM | POA: Diagnosis not present

## 2019-09-28 DIAGNOSIS — E441 Mild protein-calorie malnutrition: Secondary | ICD-10-CM | POA: Diagnosis not present

## 2019-09-28 DIAGNOSIS — Z736 Limitation of activities due to disability: Secondary | ICD-10-CM | POA: Diagnosis not present

## 2019-09-28 DIAGNOSIS — K219 Gastro-esophageal reflux disease without esophagitis: Secondary | ICD-10-CM | POA: Diagnosis not present

## 2019-09-28 DIAGNOSIS — G8929 Other chronic pain: Secondary | ICD-10-CM

## 2019-09-28 DIAGNOSIS — K5909 Other constipation: Secondary | ICD-10-CM

## 2019-09-28 DIAGNOSIS — M25512 Pain in left shoulder: Secondary | ICD-10-CM

## 2019-09-28 DIAGNOSIS — Z0000 Encounter for general adult medical examination without abnormal findings: Secondary | ICD-10-CM

## 2019-09-28 DIAGNOSIS — Z0001 Encounter for general adult medical examination with abnormal findings: Secondary | ICD-10-CM | POA: Diagnosis not present

## 2019-09-28 MED ORDER — PROMETHAZINE HCL 12.5 MG PO TABS: 12.5000 mg | ORAL_TABLET | Freq: Three times a day (TID) | ORAL | 1 refills | Status: DC | PRN

## 2019-09-28 MED ORDER — MIRTAZAPINE 15 MG PO TABS: ORAL_TABLET | ORAL | 2 refills | Status: DC

## 2019-09-28 MED ORDER — DEXILANT 60 MG PO CPDR: DELAYED_RELEASE_CAPSULE | ORAL | 2 refills | Status: DC

## 2019-09-28 NOTE — Patient Instructions (Addendum)
Referral for sleep study  Restart dexilant Restart remeron at bedtime for appetite  We will call with lab results F/U 2 months for medication/weight check

## 2019-09-28 NOTE — Progress Notes (Signed)
Subjective:   Patient presents for Medicare Annual/Subsequent preventive examination.      Pt here for wellness visit    His major concern is difficulty sleeping    He had sleep study done in Oct  2014 which showed mild sleep apnea but didn't require treatment   He is often afraid to go to sleep as he wakes up feeling like he cant catch his breath  he also snores very loudly  He does not have any difficulty breathing during the day    Bilat shoulder pain- and into neck, this has been going on for quite some time he uses topical rubs and heating pads, nore of a soreness in shoulders and neck    No tingling or numbness in fingertips    Tylenol also helps  No injury     Chronic stomach pain- he is out of dexilant and phenggan    Appetite he does not have remeron ,yet pharmacy states he picked up in march, he hasnt been eating much but continues to smoke marijuana despite his chronic GI issues from use   weight down 10lbs since Jan            Review Past Medical/Family/Social: per EMR    Risk Factors  Current exercise habits: None Dietary issues discussed: Yes  Cardiac risk factors: HTN  Depression Screen  (Note: if answer to either of the following is "Yes", a more complete depression screening is indicated)  Over the past two weeks, have you felt down, depressed or hopeless? No Over the past two weeks, have you felt little interest or pleasure in doing things? No Have you lost interest or pleasure in daily life? No Do you often feel hopeless? No Do you cry easily over simple problems? No   Activities of Daily Living  In your present state of health, do you have any difficulty performing the following activities?:  Driving? Yes Managing money? No  Feeding yourself? No  Getting from bed to chair? No  Climbing a flight of stairs? Yes  Preparing food and eating?: No  Bathing or showering? No  Getting dressed: No  Getting to the toilet? No  Using the toilet:No   Moving around from place to place: No  In the past year have you fallen or had a near fall?:Yes Are you sexually active? No  Do you have more than one partner? No   Hearing Difficulties: No  Do you often ask people to speak up or repeat themselves? No  Do you experience ringing or noises in your ears? No Do you have difficulty understanding soft or whispered voices? No  Do you feel that you have a problem with memory? No Do you often misplace items? No  Do you feel safe at home? Yes  Cognitive Testing  Alert? Yes Normal Appearance?Yes  Oriented to person? Yes Place? Yes  Time? Yes  Recall of three objects? Yes  Can perform simple calculations? Yes  Displays appropriate judgment?Yes  Can read the correct time from a watch face?Yes   List the Names of Other Physician/Practitioners you currently use:   GI  Screening Tests / Date Colonoscopy      UTD               Zostavax - discussed will wait until next visit  COVID-19 UTD Influenza Vaccine  UTD Tetanus/tdap UTD  ROS: GEN- denies fatigue, fever, weight loss,weakness, recent illness HEENT- denies eye drainage, change in vision, nasal discharge, CVS- denies chest pain,  palpitations RESP- denies SOB, cough, wheeze ABD- denies N/V, change in stools, abd pain GU- denies dysuria, hematuria, dribbling, incontinence MSK- denies joint pain, muscle aches, injury Neuro- denies headache, dizziness, syncope, seizure activity   PHYSICAL: vitals reviewed ,walks with cane  GEN- NAD, alert and oriented x3 HEENT- PERRL, EOMI, non injected sclera, pink conjunctiva, MMM, oropharynx clear Neck- Supple, no thryomegaly CVS- RRR, no murmur RESP-CTAB ABD- TTP epigastric region, no rebound, no guarding, no CVA tenderness  MSK- good ROM neck, FROM upper ext, rotator cuff in tact, biceps in tact EXT- No edema Pulses- Radial, DP- 2+    Assessment:    Annual wellness medicare exam   Plan:    During the course of the visit the patient  was educated and counseled about appropriate screening and preventive services including:   CPE done, fasting labs, prostate cancer screening  REVIEWED meds in detail, he is often missing medications  GERD/Chronic abd pain nausea- overuse of marijuana has been worked up by GI, restart dexilant, continue carafate  Weight loss- protein calorie malnutrition- restart remeron he had good effects last time with use  prostate cancer screening to be done   MDD- continue cymbalta also helps chronic pain  Constipation- continue linzess, working well  Defer Shingles at this time   High fall risked due to impaired gait he prefers use of cane/walking stick    For sleep issues- will obtain sleep study, concern he has worsening sleep apnea  Bilat shoulder ain likely some OA, but exam looks good, he describes as  "soreness" advised to try some exercise, ROM, okay to use tylenol, hold on imaging  .  Diet review for nutrition referral? Yes ____ Not Indicated __x__  Patient Instructions (the written plan) was given to the patient.  Medicare Attestation  I have personally reviewed:  The patient's medical and social history  Their use of alcohol, tobacco or illicit drugs  Their current medications and supplements  The patient's functional ability including ADLs,fall risks, home safety risks, cognitive, and hearing and visual impairment  Diet and physical activities  Evidence for depression or mood disorders  The patient's weight, height, BMI, and visual acuity have been recorded in the chart. I have made referrals, counseling, and provided education to the patient based on review of the above and I have provided the patient with a written personalized care plan for preventive services.

## 2019-09-29 LAB — COMPREHENSIVE METABOLIC PANEL
AG Ratio: 1.7 (calc) (ref 1.0–2.5)
ALT: 19 U/L (ref 9–46)
AST: 25 U/L (ref 10–35)
Albumin: 4.2 g/dL (ref 3.6–5.1)
Alkaline phosphatase (APISO): 40 U/L (ref 35–144)
BUN: 22 mg/dL (ref 7–25)
CO2: 27 mmol/L (ref 20–32)
Calcium: 9.3 mg/dL (ref 8.6–10.3)
Chloride: 103 mmol/L (ref 98–110)
Creat: 1.2 mg/dL (ref 0.70–1.33)
Globulin: 2.5 g/dL (calc) (ref 1.9–3.7)
Glucose, Bld: 71 mg/dL (ref 65–99)
Potassium: 4.3 mmol/L (ref 3.5–5.3)
Sodium: 137 mmol/L (ref 135–146)
Total Bilirubin: 0.5 mg/dL (ref 0.2–1.2)
Total Protein: 6.7 g/dL (ref 6.1–8.1)

## 2019-09-29 LAB — CBC WITH DIFFERENTIAL/PLATELET
Absolute Monocytes: 816 cells/uL (ref 200–950)
Basophils Absolute: 43 cells/uL (ref 0–200)
Basophils Relative: 0.5 %
Eosinophils Absolute: 340 cells/uL (ref 15–500)
Eosinophils Relative: 4 %
HCT: 36.4 % — ABNORMAL LOW (ref 38.5–50.0)
Hemoglobin: 12.5 g/dL — ABNORMAL LOW (ref 13.2–17.1)
Lymphs Abs: 3647 cells/uL (ref 850–3900)
MCH: 33.2 pg — ABNORMAL HIGH (ref 27.0–33.0)
MCHC: 34.3 g/dL (ref 32.0–36.0)
MCV: 96.8 fL (ref 80.0–100.0)
MPV: 10.2 fL (ref 7.5–12.5)
Monocytes Relative: 9.6 %
Neutro Abs: 3655 cells/uL (ref 1500–7800)
Neutrophils Relative %: 43 %
Platelets: 183 10*3/uL (ref 140–400)
RBC: 3.76 10*6/uL — ABNORMAL LOW (ref 4.20–5.80)
RDW: 13.2 % (ref 11.0–15.0)
Total Lymphocyte: 42.9 %
WBC: 8.5 10*3/uL (ref 3.8–10.8)

## 2019-09-29 LAB — PSA: PSA: 0.9 ng/mL (ref <=4.0)

## 2019-10-07 ENCOUNTER — Other Ambulatory Visit: Payer: Self-pay | Admitting: Family Medicine

## 2019-10-21 ENCOUNTER — Encounter: Payer: Self-pay | Admitting: Neurology

## 2019-10-21 ENCOUNTER — Ambulatory Visit (INDEPENDENT_AMBULATORY_CARE_PROVIDER_SITE_OTHER): Payer: Medicare PPO | Admitting: Neurology

## 2019-10-21 VITALS — BP 132/71 | HR 53 | Ht 66.0 in | Wt 127.0 lb

## 2019-10-21 DIAGNOSIS — R0683 Snoring: Secondary | ICD-10-CM | POA: Diagnosis not present

## 2019-10-21 DIAGNOSIS — Z72821 Inadequate sleep hygiene: Secondary | ICD-10-CM | POA: Insufficient documentation

## 2019-10-21 DIAGNOSIS — F1220 Cannabis dependence, uncomplicated: Secondary | ICD-10-CM | POA: Diagnosis not present

## 2019-10-21 DIAGNOSIS — R519 Headache, unspecified: Secondary | ICD-10-CM | POA: Diagnosis not present

## 2019-10-21 DIAGNOSIS — R0689 Other abnormalities of breathing: Secondary | ICD-10-CM | POA: Insufficient documentation
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
## 2019-10-21 NOTE — Patient Instructions (Signed)

## 2019-11-30 ENCOUNTER — Other Ambulatory Visit: Payer: Self-pay

## 2019-11-30 ENCOUNTER — Encounter: Payer: Self-pay | Admitting: Family Medicine

## 2019-11-30 ENCOUNTER — Ambulatory Visit (INDEPENDENT_AMBULATORY_CARE_PROVIDER_SITE_OTHER): Payer: Medicare PPO | Admitting: Family Medicine

## 2019-11-30 VITALS — BP 124/68 | HR 62 | Temp 98.3°F | Resp 16 | Ht 66.0 in | Wt 131.0 lb

## 2019-11-30 DIAGNOSIS — F1290 Cannabis use, unspecified, uncomplicated: Secondary | ICD-10-CM | POA: Diagnosis not present

## 2019-11-30 DIAGNOSIS — G43B0 Ophthalmoplegic migraine, not intractable: Secondary | ICD-10-CM | POA: Diagnosis not present

## 2019-11-30 DIAGNOSIS — E441 Mild protein-calorie malnutrition: Secondary | ICD-10-CM

## 2019-11-30 MED ORDER — DEXILANT 60 MG PO CPDR: DELAYED_RELEASE_CAPSULE | ORAL | 2 refills | Status: DC

## 2019-11-30 MED ORDER — SUCRALFATE 1 G PO TABS: ORAL_TABLET | ORAL | 2 refills | Status: DC

## 2019-11-30 MED ORDER — PROMETHAZINE HCL 12.5 MG PO TABS: 12.5000 mg | ORAL_TABLET | Freq: Three times a day (TID) | ORAL | 1 refills | Status: DC | PRN

## 2019-11-30 NOTE — Assessment & Plan Note (Signed)
Before starting any other new migraine medication he is already scheduled for sleep study next week.  If he does get a severe headache he can take 50 mg of Imitrex

## 2019-11-30 NOTE — Progress Notes (Signed)
   Subjective:    Patient ID: Brandon Lawrence, male    DOB: 03-27-1961, 59 y.o.   MRN: [id number]  Patient presents for Follow-up (is not fasting), Reflux (some indigestion/ pain- hasnot had dexilant), and Frequent HA (HA in R side above eye- imitrx not really effective) Patient here for interim follow-up on medication management as well as his weight.  He was restarted on mirtazapine at his visit 2 months ago.  Also with her his medications in detail to make sure he had all his refills and understood the importance of his meds  GERD- he has not taken his dexilant, states he needs to get it refilled   Headaches and concern for OSA-  seen by neurology  has sleep study set for August 4th   he has imitrex but has only used 2 of them out of the pack since Jan, states they are strong.  He has not had any recent headaches.  When he does get them very frequently on the right, mid pain  Weight up 5lbs since May, he is eating better  he is now taking the remeron  He typically starts eating around 11-12pm       Review Of Systems:  GEN- denies fatigue, fever, weight loss,weakness, recent illness HEENT- denies eye drainage, change in vision, nasal discharge, CVS- denies chest pain, palpitations RESP- denies SOB, cough, wheeze ABD- denies N/V, change in stools, abd pain GU- denies dysuria, hematuria, dribbling, incontinence MSK- denies joint pain, muscle aches, injury Neuro- + headaches,  Denies dizziness, syncope, seizure activity       Objective:    BP 124/68   Pulse 62   Temp 98.3 F (36.8 C) (Temporal)   Resp 16   Ht 5\' 6"  (1.676 m)   Wt 131 lb (59.4 kg)   SpO2 95%   BMI 21.14 kg/m  GEN- NAD, alert and oriented x3 HEENT- PERRL, EOMI, non injected sclera, pink conjunctiva, MMM, oropharynx clear Neck- Supple, no thyromegaly CVS- RRR, no murmur RESP-CTAB ABD-NABS,soft,NT,ND EXT- No edema Pulses- Radial 2+        Assessment & Plan:    Once again I spent time discussing  the importance of his medications and appropriate refills. Problem List Items Addressed This Visit      Unprioritized   Marijuana use    As needed Phenergan refills.      Migraine headache    Before starting any other new migraine medication he is already scheduled for sleep study next week.  If he does get a severe headache he can take 50 mg of Imitrex      Protein-calorie malnutrition ([place]) - Primary    Weight has improved 5 pounds with the addition of the mirtazapine.  Unfortunately continues to smoke marijuana almost daily which contributes to his chronic GI issues.  Also concern from the neurologist that this is contributing to his sleep issues and the migraines.  He does not want to quit smoking.         Note: This dictation was prepared with Dragon dictation along with smaller phrase technology. Any transcriptional errors that result from this process are unintentional.

## 2019-11-30 NOTE — Assessment & Plan Note (Signed)
Weight has improved 5 pounds with the addition of the mirtazapine.  Unfortunately continues to smoke marijuana almost daily which contributes to his chronic GI issues.  Also concern from the neurologist that this is contributing to his sleep issues and the migraines.  He does not want to quit smoking.

## 2019-11-30 NOTE — Patient Instructions (Addendum)
F/U 4 months  Take 1/2 tablet of the imitrex as needed

## 2019-11-30 NOTE — Assessment & Plan Note (Signed)
As needed Phenergan refills.

## 2019-12-03 DIAGNOSIS — H524 Presbyopia: Secondary | ICD-10-CM | POA: Diagnosis not present

## 2019-12-03 DIAGNOSIS — H2513 Age-related nuclear cataract, bilateral: Secondary | ICD-10-CM | POA: Diagnosis not present

## 2019-12-03 DIAGNOSIS — H31011 Macula scars of posterior pole (postinflammatory) (post-traumatic), right eye: Secondary | ICD-10-CM | POA: Diagnosis not present

## 2019-12-03 DIAGNOSIS — H35361 Drusen (degenerative) of macula, right eye: Secondary | ICD-10-CM | POA: Diagnosis not present

## 2019-12-03 DIAGNOSIS — H35013 Changes in retinal vascular appearance, bilateral: Secondary | ICD-10-CM | POA: Diagnosis not present

## 2019-12-08 ENCOUNTER — Ambulatory Visit: Payer: Medicare PPO | Admitting: Neurology

## 2019-12-08 ENCOUNTER — Other Ambulatory Visit: Payer: Self-pay

## 2019-12-28 ENCOUNTER — Telehealth: Payer: Self-pay

## 2019-12-28 NOTE — Telephone Encounter (Signed)
LVM for pt to call me back to about HST not worn correctly. Called pt now 3 times. Waiting for call back from patient

## 2020-01-04 NOTE — Telephone Encounter (Signed)
Pt returned call. Call [phone]

## 2020-01-05 ENCOUNTER — Other Ambulatory Visit: Payer: Self-pay | Admitting: Family Medicine

## 2020-01-12 ENCOUNTER — Telehealth: Payer: Self-pay | Admitting: *Deleted

## 2020-01-12 MED ORDER — DEXILANT 60 MG PO CPDR: DELAYED_RELEASE_CAPSULE | ORAL | 2 refills | Status: DC

## 2020-01-12 MED ORDER — SUCRALFATE 1 G PO TABS: ORAL_TABLET | ORAL | 2 refills | Status: DC

## 2020-01-12 NOTE — Telephone Encounter (Signed)
Received call from patient.   Reports that he requires refill on "reflex" medicine for his stomach. Reports that it has not been filled since and he has not had it in months.   Inquired as to which medication he is requesting. Patient unable to tell writer name of medication, just kept claiming he need the "reflex" medicine for his stomach.   Inquired as to if he needed the Dexilant or the Carafate.   Patient became upset as he did not know the name of the medication, reporting that the medication is listed on his chart. Again advised that he has (2) medications listed for reflux/ indigestion, and without the name of the medication, writer is unable to determine which he is requesting.   Advised that both medications could be sent to the pharmacy and he could pick up whichever he needed. Attempted to verify pharmacy with patient. Patient noted to be upset stating that it's the same pharmacy he always goes to. Advised that Probation officer must verify pharmacy to make sure prescriptions are going where the patient thinks they are going.   Patient noted as agitated and stated that he no longer wished to discuss with Probation officer. Requested to speak with PCP. Advised that PCP is out of the office and is not available at this time. States that he will come to office to discuss with PCP.   PCP to be made aware.

## 2020-01-12 NOTE — Telephone Encounter (Signed)
Noted, he has had meds refilled multiple times Often confused I have asked him to bring meds with him  Pt can schedule OV

## 2020-01-26 ENCOUNTER — Ambulatory Visit (INDEPENDENT_AMBULATORY_CARE_PROVIDER_SITE_OTHER): Payer: Medicare PPO | Admitting: Neurology

## 2020-01-26 DIAGNOSIS — R0689 Other abnormalities of breathing: Secondary | ICD-10-CM

## 2020-01-26 DIAGNOSIS — R0683 Snoring: Secondary | ICD-10-CM

## 2020-01-26 DIAGNOSIS — Z72821 Inadequate sleep hygiene: Secondary | ICD-10-CM

## 2020-01-26 DIAGNOSIS — G4733 Obstructive sleep apnea (adult) (pediatric): Secondary | ICD-10-CM

## 2020-01-26 DIAGNOSIS — F1220 Cannabis dependence, uncomplicated: Secondary | ICD-10-CM

## 2020-01-26 DIAGNOSIS — R519 Headache, unspecified: Secondary | ICD-10-CM

## 2020-02-08 ENCOUNTER — Other Ambulatory Visit: Payer: Self-pay | Admitting: Neurology

## 2020-02-08 DIAGNOSIS — Z72821 Inadequate sleep hygiene: Secondary | ICD-10-CM

## 2020-02-08 DIAGNOSIS — F1220 Cannabis dependence, uncomplicated: Secondary | ICD-10-CM

## 2020-02-08 DIAGNOSIS — R519 Headache, unspecified: Secondary | ICD-10-CM

## 2020-02-08 DIAGNOSIS — R0683 Snoring: Secondary | ICD-10-CM

## 2020-02-08 DIAGNOSIS — R0689 Other abnormalities of breathing: Secondary | ICD-10-CM

## 2020-02-08 NOTE — Progress Notes (Signed)
Summary & Diagnosis:   This HST indicated moderate severe OSA (obstructive sleep apnea)  at an AHI of 22.9/h and with REM sleep accentuation ( REM AHI  was 34/h) . There was a highly variable pulse rate recorded.  Hypoxia was not prolonged, but snoring was very loud-. Lowest AHI  while sleeping on the right side.   Recommendations:    I recommend CPAP therapy at settings of 5-16 cm water, 2 cm EPR  and mask and humidity degree of patient's choice. Please fit for  mask.   Interpreting Physician: Larey Seat, MD
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
## 2020-02-09 ENCOUNTER — Telehealth: Payer: Self-pay | Admitting: Neurology

## 2020-02-09 NOTE — Telephone Encounter (Signed)
I called pt. I advised pt that Dr. Brett Fairy reviewed their sleep study results and found that pt has moderate sleep apnea. Dr. Brett Fairy recommends that pt starts auto CPAP. I reviewed PAP compliance expectations with the pt. Pt is agreeable to starting a CPAP. I advised pt that an order will be sent to a DME, [facility], and [facility] will call the pt within about one week after they file with the pt's insurance. [facility] will show the pt how to use the machine, fit for masks, and troubleshoot the CPAP if needed. A follow up appt was made for insurance purposes with Debbora Presto, NP on Jan 13,2022 at . Pt verbalized understanding to arrive 15 minutes early and bring their CPAP. A letter with all of this information in it will be mailed to the pt as a reminder. I verified with the pt that the address we have on file is correct. Pt verbalized understanding of results. Pt had no questions at this time but was encouraged to call back if questions arise. I have sent the order to [facility] and have received confirmation that they have received the order.

## 2020-02-09 NOTE — Telephone Encounter (Signed)
-----   Message from Larey Seat, MD sent at 02/08/2020  4:59 PM EDT ----- Summary & Diagnosis:   This HST indicated moderate severe OSA (obstructive sleep apnea)  at an AHI of 22.9/h and with REM sleep accentuation ( REM AHI  was 34/h) . There was a highly variable pulse rate recorded.  Hypoxia was not prolonged, but snoring was very loud-. Lowest AHI  while sleeping on the right side.   Recommendations:    I recommend CPAP therapy at settings of 5-16 cm water, 2 cm EPR  and mask and humidity degree of patient's choice. Please fit for  mask.   Interpreting Physician: Larey Seat, MD

## 2020-04-04 ENCOUNTER — Other Ambulatory Visit: Payer: Self-pay | Admitting: Family Medicine

## 2020-04-04 ENCOUNTER — Ambulatory Visit (INDEPENDENT_AMBULATORY_CARE_PROVIDER_SITE_OTHER): Payer: Medicare PPO | Admitting: Family Medicine

## 2020-04-04 ENCOUNTER — Encounter: Payer: Self-pay | Admitting: Family Medicine

## 2020-04-04 ENCOUNTER — Other Ambulatory Visit: Payer: Self-pay

## 2020-04-04 VITALS — BP 132/84 | HR 68 | Temp 98.4°F | Resp 14 | Ht 66.0 in | Wt 129.0 lb

## 2020-04-04 DIAGNOSIS — G4733 Obstructive sleep apnea (adult) (pediatric): Secondary | ICD-10-CM

## 2020-04-04 DIAGNOSIS — D508 Other iron deficiency anemias: Secondary | ICD-10-CM

## 2020-04-04 DIAGNOSIS — Z85528 Personal history of other malignant neoplasm of kidney: Secondary | ICD-10-CM

## 2020-04-04 DIAGNOSIS — L989 Disorder of the skin and subcutaneous tissue, unspecified: Secondary | ICD-10-CM | POA: Diagnosis not present

## 2020-04-04 DIAGNOSIS — I10 Essential (primary) hypertension: Secondary | ICD-10-CM | POA: Diagnosis not present

## 2020-04-04 DIAGNOSIS — E441 Mild protein-calorie malnutrition: Secondary | ICD-10-CM | POA: Diagnosis not present

## 2020-04-04 DIAGNOSIS — Z23 Encounter for immunization: Secondary | ICD-10-CM

## 2020-04-04 MED ORDER — TRIAMCINOLONE ACETONIDE 0.1 % EX CREA: 1.0000 "application " | TOPICAL_CREAM | Freq: Two times a day (BID) | CUTANEOUS | 0 refills | Status: DC

## 2020-04-04 MED ORDER — PROMETHAZINE HCL 12.5 MG PO TABS: 12.5000 mg | ORAL_TABLET | Freq: Three times a day (TID) | ORAL | 1 refills | Status: DC | PRN

## 2020-04-04 MED ORDER — SUMATRIPTAN SUCCINATE 100 MG PO TABS: 100.0000 mg | ORAL_TABLET | ORAL | 1 refills | Status: DC | PRN

## 2020-04-04 MED ORDER — MIRTAZAPINE 15 MG PO TABS: ORAL_TABLET | ORAL | 2 refills | Status: DC

## 2020-04-04 NOTE — Assessment & Plan Note (Signed)
Referral to urology for F/U

## 2020-04-04 NOTE — Assessment & Plan Note (Signed)
Controlled no changes 

## 2020-04-04 NOTE — Patient Instructions (Addendum)
F/U 4 months  Use cream twice a day to leg Flu shot given  Referral back to urology for check up

## 2020-04-04 NOTE — Assessment & Plan Note (Signed)
Restart remeron again He is going to look into mail order Maybe this will help keep his meds on track

## 2020-04-04 NOTE — Assessment & Plan Note (Signed)
I will contact his neurologist regarding CPAP machine /orders, he has not received yet

## 2020-04-04 NOTE — Progress Notes (Signed)
Subjective:    Patient ID: Brandon Lawrence, male    DOB: 08/25/1960, 59 y.o.   MRN: [id number]  Patient presents for Follow-up (is fasting), Blood in Stool (recent bright red blood in stool), and Wound to Inner thigh (x1 month- started off as itchy bite like area- scratched area and opened up a little bit has not healed)   Pt here to f/u chronic medical problems        He has rash on left inner thigh for the past month. He is not sure if he had a bug bite but it itches intensely. He scratched so much it is now a sore. He used cortisone 10 , this helped a little bit    Protein malnutrition, last visit he was taking remeron, weight was up 5lbs, weight down 2lbs states he is out of the med and cant get from the pharmacy   He had a little blood after BM where he  was straining , only had 1 episode, taking linzess regulary     He is out of phenergan Dx with OSA , prescribed CPAP but states he never received from neurology   Meds reviewed           Review Of Systems:  GEN- denies fatigue, fever, weight loss,weakness, recent illness HEENT- denies eye drainage, change in vision, nasal discharge, CVS- denies chest pain, palpitations RESP- denies SOB, cough, wheeze ABD- denies N/V, change in stools, abd pain GU- denies dysuria, hematuria, dribbling, incontinence MSK- + joint pain, denies   muscle aches, injury Neuro- denies headache, dizziness, syncope, seizure activity       Objective:    BP 132/84   Pulse 68   Temp 98.4 F (36.9 C) (Temporal)   Resp 14   Ht 5\' 6"  (1.676 m)   Wt 129 lb (58.5 kg)   SpO2 96%   BMI 20.82 kg/m  GEN- NAD, alert and oriented x3 HEENT- PERRL, EOMI, non injected sclera, pink conjunctiva, MMM, oropharynx clear Neck- Supple, no thyromegaly CVS- RRR, no murmur RESP-CTAB ABD-NABS,soft,NT,ND Skin left mid inner thigh hyperpigmented papule with scab at center no fluctuance, NT  EXT- No edema Pulses- Radial, DP- 2+        Assessment &  Plan:      Problem List Items Addressed This Visit      Unprioritized   Essential hypertension, benign    Controlled no changes       Relevant Orders   CBC with Differential/Platelet   Comprehensive metabolic panel   H/O renal cell carcinoma    Referral to urology for F/U       Relevant Orders   Ambulatory referral to Urology   Comprehensive metabolic panel   Iron deficiency anemia - Primary   Relevant Orders   CBC with Differential/Platelet   Comprehensive metabolic panel   Iron, TIBC and Ferritin Panel   RESOLVED: OSA (obstructive sleep apnea)   Protein-calorie malnutrition ([place])    Restart remeron again He is going to look into mail order Maybe this will help keep his meds on track      Relevant Orders   CBC with Differential/Platelet   Comprehensive metabolic panel   Sleep apnea    I will contact his neurologist regarding CPAP machine /orders, he has not received yet        Other Visit Diagnoses    Skin lesion       appears to be irriated bug bite, given triamcinolone , no superinfection noted  Need for immunization against influenza       Relevant Orders   Flu Vaccine QUAD 36+ mos IM (Completed)      Note: This dictation was prepared with Dragon dictation along with smaller phrase technology. Any transcriptional errors that result from this process are unintentional.

## 2020-04-05 ENCOUNTER — Telehealth: Payer: Self-pay | Admitting: Neurology

## 2020-04-05 ENCOUNTER — Encounter: Payer: Self-pay | Admitting: Neurology

## 2020-04-05 LAB — COMPREHENSIVE METABOLIC PANEL
AG Ratio: 1.4 (calc) (ref 1.0–2.5)
ALT: 18 U/L (ref 9–46)
AST: 23 U/L (ref 10–35)
Albumin: 4.2 g/dL (ref 3.6–5.1)
Alkaline phosphatase (APISO): 40 U/L (ref 35–144)
BUN: 16 mg/dL (ref 7–25)
CO2: 24 mmol/L (ref 20–32)
Calcium: 9.3 mg/dL (ref 8.6–10.3)
Chloride: 104 mmol/L (ref 98–110)
Creat: 1.09 mg/dL (ref 0.70–1.33)
Globulin: 2.9 g/dL (calc) (ref 1.9–3.7)
Glucose, Bld: 93 mg/dL (ref 65–99)
Potassium: 4.1 mmol/L (ref 3.5–5.3)
Sodium: 138 mmol/L (ref 135–146)
Total Bilirubin: 0.4 mg/dL (ref 0.2–1.2)
Total Protein: 7.1 g/dL (ref 6.1–8.1)

## 2020-04-05 LAB — CBC WITH DIFFERENTIAL/PLATELET
Absolute Monocytes: 926 cells/uL (ref 200–950)
Basophils Absolute: 36 cells/uL (ref 0–200)
Basophils Relative: 0.4 %
Eosinophils Absolute: 392 cells/uL (ref 15–500)
Eosinophils Relative: 4.4 %
HCT: 35.5 % — ABNORMAL LOW (ref 38.5–50.0)
Hemoglobin: 12.2 g/dL — ABNORMAL LOW (ref 13.2–17.1)
Lymphs Abs: 3293 cells/uL (ref 850–3900)
MCH: 32.3 pg (ref 27.0–33.0)
MCHC: 34.4 g/dL (ref 32.0–36.0)
MCV: 93.9 fL (ref 80.0–100.0)
MPV: 10.1 fL (ref 7.5–12.5)
Monocytes Relative: 10.4 %
Neutro Abs: 4254 cells/uL (ref 1500–7800)
Neutrophils Relative %: 47.8 %
Platelets: 212 10*3/uL (ref 140–400)
RBC: 3.78 10*6/uL — ABNORMAL LOW (ref 4.20–5.80)
RDW: 13 % (ref 11.0–15.0)
Total Lymphocyte: 37 %
WBC: 8.9 10*3/uL (ref 3.8–10.8)

## 2020-04-05 LAB — IRON,TIBC AND FERRITIN PANEL
%SAT: 17 % (calc) — ABNORMAL LOW (ref 20–48)
Ferritin: 148 ng/mL (ref 38–380)
Iron: 54 ug/dL (ref 50–180)
TIBC: 316 mcg/dL (calc) (ref 250–425)

## 2020-04-05 NOTE — Telephone Encounter (Signed)
Dear Brandon Lawrence, Dear Dr. Buelah Manis,  Unfortunately, we suffer in the CPAP/ BiPAP World from supply chain strains and shortages just as so many other branches of economy.  On top of all , there a has been a major recall of a leading brand name for PAP and respirator machines.  CPAPs we had ordered in September 21 have just been announced for delivery in January- February.  These are a new Chinese made brand' LUNA" CPAP,  which has been offered to [place], but we had hesitated as there are no longer term reliability scores and no patient experiences known regarding this product.  We certainly can offer to order it for Brandon Lawrence, as this will shorten his wait.    Sincerely,  Larey Seat, MD

## 2020-04-13 ENCOUNTER — Other Ambulatory Visit: Payer: Self-pay | Admitting: *Deleted

## 2020-04-13 MED ORDER — SLOW RELEASE IRON 160 (50 FE) MG PO TBCR: 160.0000 mg | EXTENDED_RELEASE_TABLET | Freq: Every day | ORAL | 0 refills | Status: DC

## 2020-05-15 ENCOUNTER — Ambulatory Visit (INDEPENDENT_AMBULATORY_CARE_PROVIDER_SITE_OTHER): Payer: Medicare Other | Admitting: Urology

## 2020-05-15 ENCOUNTER — Other Ambulatory Visit: Payer: Self-pay

## 2020-05-15 VITALS — BP 105/51 | HR 57 | Temp 98.6°F | Ht 66.0 in | Wt 135.0 lb

## 2020-05-15 DIAGNOSIS — C649 Malignant neoplasm of unspecified kidney, except renal pelvis: Secondary | ICD-10-CM | POA: Diagnosis not present

## 2020-05-15 LAB — URINALYSIS, ROUTINE W REFLEX MICROSCOPIC
Bilirubin, UA: NEGATIVE
Glucose, UA: NEGATIVE
Leukocytes,UA: NEGATIVE
Nitrite, UA: NEGATIVE
Protein,UA: NEGATIVE
RBC, UA: NEGATIVE
Specific Gravity, UA: 1.025 (ref 1.005–1.030)
Urobilinogen, Ur: 0.2 mg/dL (ref 0.2–1.0)
pH, UA: 5.5 (ref 5.0–7.5)

## 2020-05-15 NOTE — Progress Notes (Signed)
Urological Symptom Review  Patient is experiencing the following symptoms: Get up at night to urinate Erection problems  Review of Systems  Gastrointestinal (upper)  : Negative for upper GI symptoms  Gastrointestinal (lower) : Negative for lower GI symptoms  Constitutional : Negative for symptoms  Skin: Negative for skin symptoms  Eyes: Negative for eye symptoms  Ear/Nose/Throat : Negative for Ear/Nose/Throat symptoms  Hematologic/Lymphatic: Negative for Hematologic/Lymphatic symptoms  Cardiovascular : Negative for cardiovascular symptoms  Respiratory : Negative for respiratory symptoms  Endocrine: Negative for endocrine symptoms  Musculoskeletal: Negative for musculoskeletal symptoms  Neurological: Negative for neurological symptoms  Psychologic: Negative for psychiatric symptoms  
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
## 2020-05-15 NOTE — Patient Instructions (Signed)

## 2020-05-17 ENCOUNTER — Telehealth: Payer: Self-pay

## 2020-05-17 NOTE — Telephone Encounter (Signed)
I spoke with patient to see if he received his CPAP yet due to having an initial CPAP visit scheduled for tomorrow 05/18/20. He said he had not received it yet and didn't know when he would. I advised him to let us know when he receives it so we can schedule him a visit. He understood.

## 2020-05-18 ENCOUNTER — Ambulatory Visit: Payer: Self-pay | Admitting: Family Medicine

## 2020-05-23 ENCOUNTER — Encounter: Payer: Self-pay | Admitting: Urology

## 2020-06-06 ENCOUNTER — Ambulatory Visit (HOSPITAL_COMMUNITY)
Admission: RE | Admit: 2020-06-06 | Discharge: 2020-06-06 | Disposition: A | Discharge status: Home / Self Care | Payer: Medicare Other | Source: Ambulatory Visit | Attending: Urology | Admitting: Urology

## 2020-06-06 ENCOUNTER — Other Ambulatory Visit: Payer: Self-pay

## 2020-06-06 DIAGNOSIS — C649 Malignant neoplasm of unspecified kidney, except renal pelvis: Secondary | ICD-10-CM | POA: Diagnosis present

## 2020-06-06 DIAGNOSIS — Z905 Acquired absence of kidney: Secondary | ICD-10-CM | POA: Insufficient documentation

## 2020-06-06 DIAGNOSIS — I745 Embolism and thrombosis of iliac artery: Secondary | ICD-10-CM | POA: Diagnosis not present

## 2020-06-06 DIAGNOSIS — I700 Atherosclerosis of aorta: Secondary | ICD-10-CM | POA: Insufficient documentation

## 2020-06-06 LAB — POCT I-STAT CREATININE: Creatinine, Ser: 1.1 mg/dL (ref 0.61–1.24)

## 2020-06-06 MED ORDER — IOHEXOL 300 MG/ML  SOLN
100.0000 mL | Freq: Once | INTRAMUSCULAR | Status: AC | PRN
  Administered 2020-06-06: 100 mL via INTRAVENOUS

## 2020-06-14 ENCOUNTER — Encounter: Payer: Self-pay | Admitting: Urology

## 2020-06-14 ENCOUNTER — Ambulatory Visit (INDEPENDENT_AMBULATORY_CARE_PROVIDER_SITE_OTHER): Payer: Medicare Other | Admitting: Urology

## 2020-06-14 ENCOUNTER — Other Ambulatory Visit: Payer: Self-pay

## 2020-06-14 VITALS — BP 113/56 | HR 55 | Temp 98.2°F | Ht 66.0 in | Wt 138.0 lb

## 2020-06-14 DIAGNOSIS — C649 Malignant neoplasm of unspecified kidney, except renal pelvis: Secondary | ICD-10-CM

## 2020-06-14 LAB — URINALYSIS, ROUTINE W REFLEX MICROSCOPIC
Bilirubin, UA: NEGATIVE
Glucose, UA: NEGATIVE
Ketones, UA: NEGATIVE
Leukocytes,UA: NEGATIVE
Nitrite, UA: NEGATIVE
Protein,UA: NEGATIVE
RBC, UA: NEGATIVE
Specific Gravity, UA: 1.02 (ref 1.005–1.030)
Urobilinogen, Ur: 0.2 mg/dL (ref 0.2–1.0)
pH, UA: 6 (ref 5.0–7.5)
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
## 2020-06-14 NOTE — Patient Instructions (Signed)

## 2020-06-14 NOTE — Progress Notes (Signed)
Urological Symptom Review  Patient is experiencing the following symptoms: Get up at night to urinate   Review of Systems  Gastrointestinal (upper)  : Negative for upper GI symptoms  Gastrointestinal (lower) : Negative for lower GI symptoms  Constitutional : Weight loss  Skin: Negative for skin symptoms  Eyes: Blurred vision  Ear/Nose/Throat : Negative for Ear/Nose/Throat symptoms  Hematologic/Lymphatic: Negative for Hematologic/Lymphatic symptoms  Cardiovascular : Negative for cardiovascular symptoms  Respiratory : Shortness of breath  Endocrine: Negative for endocrine symptoms  Musculoskeletal: Back pain Joint pain  Neurological: Headaches Dizziness  Psychologic: Negative for psychiatric symptoms

## 2020-06-27 ENCOUNTER — Telehealth: Payer: Self-pay | Admitting: Family Medicine

## 2020-06-27 MED ORDER — DOXAZOSIN MESYLATE 4 MG PO TABS: ORAL_TABLET | ORAL | 0 refills | Status: DC

## 2020-06-27 NOTE — Telephone Encounter (Signed)
Calling to delete [place] add [employer]. He need refill on all of his mediation been out since Nov

## 2020-06-27 NOTE — Telephone Encounter (Signed)
Call placed to patient to inquire.   Reports that he has only been taking Cardura for the past few months.   Advised that since he has not been on medication, he may require taper.   Appointment scheduled with PCP to review.

## 2020-06-28 NOTE — Telephone Encounter (Signed)
Noted pt has appt Friday

## 2020-06-30 ENCOUNTER — Other Ambulatory Visit: Payer: Self-pay

## 2020-06-30 ENCOUNTER — Encounter: Payer: Self-pay | Admitting: Family Medicine

## 2020-06-30 ENCOUNTER — Ambulatory Visit (INDEPENDENT_AMBULATORY_CARE_PROVIDER_SITE_OTHER): Payer: Medicare Other | Admitting: Family Medicine

## 2020-06-30 VITALS — BP 138/68 | HR 56 | Temp 97.7°F | Resp 16 | Ht 66.0 in | Wt 128.0 lb

## 2020-06-30 DIAGNOSIS — K5909 Other constipation: Secondary | ICD-10-CM

## 2020-06-30 DIAGNOSIS — N400 Enlarged prostate without lower urinary tract symptoms: Secondary | ICD-10-CM

## 2020-06-30 DIAGNOSIS — I10 Essential (primary) hypertension: Secondary | ICD-10-CM

## 2020-06-30 DIAGNOSIS — D229 Melanocytic nevi, unspecified: Secondary | ICD-10-CM

## 2020-06-30 DIAGNOSIS — M1711 Unilateral primary osteoarthritis, right knee: Secondary | ICD-10-CM

## 2020-06-30 DIAGNOSIS — E441 Mild protein-calorie malnutrition: Secondary | ICD-10-CM

## 2020-06-30 DIAGNOSIS — L989 Disorder of the skin and subcutaneous tissue, unspecified: Secondary | ICD-10-CM

## 2020-06-30 DIAGNOSIS — G8929 Other chronic pain: Secondary | ICD-10-CM

## 2020-06-30 DIAGNOSIS — F1290 Cannabis use, unspecified, uncomplicated: Secondary | ICD-10-CM

## 2020-06-30 DIAGNOSIS — K219 Gastro-esophageal reflux disease without esophagitis: Secondary | ICD-10-CM

## 2020-06-30 DIAGNOSIS — D508 Other iron deficiency anemias: Secondary | ICD-10-CM

## 2020-06-30 DIAGNOSIS — G4733 Obstructive sleep apnea (adult) (pediatric): Secondary | ICD-10-CM | POA: Diagnosis not present

## 2020-06-30 DIAGNOSIS — M47817 Spondylosis without myelopathy or radiculopathy, lumbosacral region: Secondary | ICD-10-CM

## 2020-06-30 DIAGNOSIS — R109 Unspecified abdominal pain: Secondary | ICD-10-CM

## 2020-06-30 MED ORDER — MIRTAZAPINE 15 MG PO TABS: ORAL_TABLET | ORAL | 2 refills | Status: DC

## 2020-06-30 MED ORDER — SUMATRIPTAN SUCCINATE 100 MG PO TABS: 100.0000 mg | ORAL_TABLET | ORAL | 2 refills | Status: DC | PRN

## 2020-06-30 MED ORDER — SLOW RELEASE IRON 160 (50 FE) MG PO TBCR: 160.0000 mg | EXTENDED_RELEASE_TABLET | Freq: Every day | ORAL | 1 refills | Status: DC

## 2020-06-30 MED ORDER — SUCRALFATE 1 G PO TABS: ORAL_TABLET | ORAL | 2 refills | Status: DC

## 2020-06-30 MED ORDER — DULOXETINE HCL 30 MG PO CPEP: ORAL_CAPSULE | ORAL | 2 refills | Status: DC

## 2020-06-30 MED ORDER — DOXAZOSIN MESYLATE 4 MG PO TABS: ORAL_TABLET | ORAL | 2 refills | Status: DC

## 2020-06-30 MED ORDER — DEXLANSOPRAZOLE 60 MG PO CPDR: DELAYED_RELEASE_CAPSULE | ORAL | 2 refills | Status: DC

## 2020-06-30 MED ORDER — CELECOXIB 100 MG PO CAPS: 100.0000 mg | ORAL_CAPSULE | Freq: Every day | ORAL | 1 refills | Status: DC

## 2020-06-30 MED ORDER — PROMETHAZINE HCL 12.5 MG PO TABS: 12.5000 mg | ORAL_TABLET | Freq: Three times a day (TID) | ORAL | 1 refills | Status: DC | PRN

## 2020-06-30 MED ORDER — LINACLOTIDE 72 MCG PO CAPS: ORAL_CAPSULE | ORAL | 3 refills | Status: DC

## 2020-06-30 MED ORDER — DICLOFENAC SODIUM 1 % EX GEL: CUTANEOUS | 3 refills | Status: DC

## 2020-06-30 NOTE — Assessment & Plan Note (Signed)
Slow release iron supplement once a day.

## 2020-06-30 NOTE — Assessment & Plan Note (Signed)
Recommend he take the mirtazapine every day to help with his appetite.

## 2020-06-30 NOTE — Assessment & Plan Note (Signed)
Restart Celebrex 100 mg once a day and topical Voltaren

## 2020-06-30 NOTE — Assessment & Plan Note (Signed)
Continue as needed medications refills were sent on everything he can take to any pharmacy with we will also deliver medication nightly.  We will get him set back up with GI

## 2020-06-30 NOTE — Patient Instructions (Addendum)
F/U 3 months for Physical with Janett Billow  For pain take celebrex once a day Use the topical cream Referral back to GI for your stomach

## 2020-06-30 NOTE — Progress Notes (Signed)
Subjective:    Patient ID: Brandon Lawrence, male    DOB: 01-Mar-1961, 60 y.o.   MRN: [id number]  Patient presents for Medication Management (Pt states that he has not been taking any >1 month) Patient here to follow-up chronic medical problems.  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  Will often have this problem where he is confused about his medications and does not have the refills.  This time he stated that he had not been taking the doxazosin which we used for this blood pressure in his BPH.  Recent follow-up with urology history of renal cell carcinoma found in 2013 with partial left nephrectomy he still cancer free.   Obstructive sleep apnea he is still waiting on device unfortunately sleep apnea devices are backboard in the setting of acute injury.  Protein calorie malnutrition chronic marijuana use related to chronic nausea GI issues.  He has not seen gastroenterology since 2019 Has chronic nausea   He doest have his arthritsi medication   2 spots on his skin.  we looked at this back  In November, looked like a bug bite, given topical steroid, it never healed and itches intensely itchy really black spots on the side of his right thigh that he is worried about.  Not sure how long they have been present.  Review Of Systems:  GEN- denies fatigue, fever, weight loss,weakness, recent illness HEENT- denies eye drainage, change in vision, nasal discharge, CVS- denies chest pain, palpitations RESP- denies SOB, cough, wheeze ABD- denies N/V, change in stools, +abd pain GU- denies dysuria, hematuria, dribbling, incontinence MSK- + joint pain, muscle aches, injury Neuro- denies headache, dizziness, syncope, seizure activity       Objective:    BP 138/68   Pulse (!) 56   Temp 97.7 F (36.5 C) (Temporal)   Resp 16   Ht 5\' 6"  (1.676 m)   Wt 128 lb (58.1 kg)   SpO2 98%   BMI 20.66 kg/m  GEN- NAD, alert and oriented x3 HEENT- PERRL, EOMI, non injected sclera,  pink conjunctiva, MMM, oropharynx clear CVS- RRR, no murmur RESP-CTAB ABD-NABS,soft,NT,ND Skin left mid inner thigh hyperpigmented papule with scab at center no fluctuance, NT  , 2 small flat hyperigmented almost black nevus EXT- No edema EXT- No edema Pulses- Radial, DP- 2+   Procedure- Shave biopsy  Procedure explained to patient questions answered benefits and risks discussed verbal consent obtained. Antiseptic-Betadine  Anesthesia-lidocaine 1% with Epi  Bilat shave biopsy,  Left thigh ingrown  hair at center removed with shave Minimal blood loss, Triple antibiotic applied  Patient tolerated procedure well Bandage applied       Assessment & Plan:      Problem List Items Addressed This Visit      Unprioritized   Benign prostatic hyperplasia   Chronic abdominal pain    Chronic nausea constipation reflux symptoms.  He is not consistently taking his medications and he continues to smoke marijuana which contribute to his chronic nausea      Relevant Medications   DULoxetine (CYMBALTA) 30 MG capsule   mirtazapine (REMERON) 15 MG tablet   celecoxib (CELEBREX) 100 MG capsule   Other Relevant Orders   Ambulatory referral to Gastroenterology   Constipation (Chronic)   Relevant Orders   Ambulatory referral to Gastroenterology   Essential hypertension, benign - Primary    Blood pressure looks okay today.  I refilled his doxazosin because his blood pressure and his prostate.  Relevant Medications   doxazosin (CARDURA) 4 MG tablet   GERD (gastroesophageal reflux disease)    Continue as needed medications refills were sent on everything he can take to any pharmacy with we will also deliver medication nightly.  We will get him set back up with GI      Relevant Medications   dexlansoprazole (DEXILANT) 60 MG capsule   linaclotide (LINZESS) 72 MCG capsule   sucralfate (CARAFATE) 1 g tablet   Other Relevant Orders   Ambulatory referral to Gastroenterology   Iron  deficiency anemia    Slow release iron supplement once a day.      Relevant Medications   ferrous sulfate (SLOW RELEASE IRON) 160 (50 Fe) MG TBCR SR tablet   Lumbosacral spondylosis without myelopathy   Relevant Medications   celecoxib (CELEBREX) 100 MG capsule   Marijuana use   OA (osteoarthritis) of knee    Restart Celebrex 100 mg once a day and topical Voltaren      Relevant Medications   celecoxib (CELEBREX) 100 MG capsule   Protein-calorie malnutrition ([place])    Recommend he take the mirtazapine every day to help with his appetite.      Relevant Orders   Ambulatory referral to Gastroenterology   Sleep apnea    Other Visit Diagnoses    Skin lesion       Relevant Orders   Pathology Report ([facility])   Atypical nevus       Relevant Orders   Pathology Report ([facility])      Note: This dictation was prepared with Dragon dictation along with smaller phrase technology. Any transcriptional errors that result from this process are unintentional.

## 2020-06-30 NOTE — Assessment & Plan Note (Addendum)
Blood pressure looks okay today.  I refilled his doxazosin because his blood pressure and his prostate.

## 2020-06-30 NOTE — Assessment & Plan Note (Signed)
Chronic nausea constipation reflux symptoms.  He is not consistently taking his medications and he continues to smoke marijuana which contribute to his chronic nausea

## 2020-07-03 ENCOUNTER — Other Ambulatory Visit: Payer: Self-pay | Admitting: Family Medicine

## 2020-07-05 ENCOUNTER — Other Ambulatory Visit: Payer: Self-pay

## 2020-07-05 ENCOUNTER — Encounter: Payer: Self-pay | Admitting: Nurse Practitioner

## 2020-07-05 ENCOUNTER — Ambulatory Visit (INDEPENDENT_AMBULATORY_CARE_PROVIDER_SITE_OTHER): Payer: Medicare Other | Admitting: Nurse Practitioner

## 2020-07-05 VITALS — BP 125/58 | HR 61 | Temp 97.7°F | Ht 66.0 in | Wt 136.0 lb

## 2020-07-05 DIAGNOSIS — K219 Gastro-esophageal reflux disease without esophagitis: Secondary | ICD-10-CM

## 2020-07-05 DIAGNOSIS — F1290 Cannabis use, unspecified, uncomplicated: Secondary | ICD-10-CM | POA: Diagnosis not present

## 2020-07-05 DIAGNOSIS — D508 Other iron deficiency anemias: Secondary | ICD-10-CM | POA: Diagnosis not present

## 2020-07-05 DIAGNOSIS — K5909 Other constipation: Secondary | ICD-10-CM

## 2020-07-05 DIAGNOSIS — R112 Nausea with vomiting, unspecified: Secondary | ICD-10-CM | POA: Diagnosis not present

## 2020-07-05 DIAGNOSIS — R1030 Lower abdominal pain, unspecified: Secondary | ICD-10-CM

## 2020-07-05 LAB — TISSUE PATH REPORT

## 2020-07-05 LAB — PATHOLOGY REPORT

## 2020-07-05 MED ORDER — PEG 3350-KCL-NA BICARB-NACL 420 G PO SOLR: 4000.0000 mL | ORAL | 0 refills | Status: DC
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
## 2020-07-05 NOTE — Progress Notes (Signed)
Cc'ed to pcp °

## 2020-07-05 NOTE — Patient Instructions (Signed)
Your health issues we discussed today were:   GERD (reflux/heartburn) with abdominal pain: 1. I am glad you are feeling better now that you are back on Dexilant 2. Continue taking your Dexilant 60 mg daily 3. You can also use Carafate as needed 4. Call us for any worsening or severe symptoms  Constipation: 1. Glad you are doing well on Linzess 2. Continue taking Linzess and let us know if you have any worsening symptoms  Anemia and due for colonoscopy: 1. As we discussed, your recent endoscopy within the past couple years is reassuring for no abnormalities 2. However, you are due for colonoscopy.  We will plan to proceed with this in the near future 3. Further recommendations will follow  Overall I recommend:  1. Continue other current medications 2. Return for follow-up in 6 months 3. Call us for any questions or concerns   ---------------------------------------------------------------  I am glad you have gotten your COVID-19 vaccination!  Even though you are fully vaccinated you should continue to follow CDC and state/local guidelines.  ---------------------------------------------------------------   At [facility] we value your feedback. You may receive a survey about your visit today. Please share your experience as we strive to create trusting relationships with our patients to provide genuine, compassionate, quality care.  We appreciate your understanding and patience as we review any laboratory studies, imaging, and other diagnostic tests that are ordered as we care for you. Our office policy is 5 business days for review of these results, and any emergent or urgent results are addressed in a timely manner for your best interest. If you do not hear from our office in 1 week, please contact us.   We also encourage the use of MyChart, which contains your medical information for your review as well. If you are not enrolled in this feature, an access code is on  this after visit summary for your convenience. Thank you for allowing [place] to be involved in your care.  It was great to see you today!  I hope you have a great spring!!

## 2020-08-09 NOTE — Patient Instructions (Signed)
Brandon Lawrence  08/09/2020     @PREFPERIOPPHARMACY @   Your procedure is scheduled on  08/14/2020.   Report to [facility] at  1100  A.M.   Call this number if you have problems the morning of surgery:  [phone]   Remember:  Follow the diet and prep instructions given to you by the office.                      Take these medicines the morning of surgery with A SIP OF WATER  Celebrex, dexilant, carudra, cymbalta, phenergan(if needed). imitrex (if needed).     Please brush your teeth.  Do not wear jewelry, make-up or nail polish.  Do not wear lotions, powders, or perfumes, or deodorant.  Do not shave 48 hours prior to surgery.  Men may shave face and neck.  Do not bring valuables to the hospital.  [facility] is not responsible for any belongings or valuables.  Contacts, dentures or bridgework may not be worn into surgery.  Leave your suitcase in the car.  After surgery it may be brought to your room.  For patients admitted to the hospital, discharge time will be determined by your treatment team.  Patients discharged the day of surgery will not be allowed to drive home and must have someone with them for 24 hours.   Special instructions:  DO NOT smoke tobacco or vape for 24 hours before your procedure.   Please read over the following fact sheets that you were given. Anesthesia Post-op Instructions and Care and Recovery After Surgery       Colonoscopy, Adult, Care After This sheet gives you information about how to care for yourself after your procedure. Your health care provider may also give you more specific instructions. If you have problems or questions, contact your health care provider. What can I expect after the procedure? After the procedure, it is common to have:  A small amount of blood in your stool for 24 hours after the procedure.  Some gas.  Mild cramping or bloating of your abdomen. Follow these instructions at home: Eating and  drinking  Drink enough fluid to keep your urine pale yellow.  Follow instructions from your health care provider about eating or drinking restrictions.  Resume your normal diet as instructed by your health care provider. Avoid heavy or fried foods that are hard to digest.   Activity  Rest as told by your health care provider.  Avoid sitting for a long time without moving. Get up to take short walks every 1-2 hours. This is important to improve blood flow and breathing. Ask for help if you feel weak or unsteady.  Return to your normal activities as told by your health care provider. Ask your health care provider what activities are safe for you. Managing cramping and bloating  Try walking around when you have cramps or feel bloated.  Apply heat to your abdomen as told by your health care provider. Use the heat source that your health care provider recommends, such as a moist heat pack or a heating pad. ? Place a towel between your skin and the heat source. ? Leave the heat on for 20-30 minutes. ? Remove the heat if your skin turns bright red. This is especially important if you are unable to feel pain, heat, or cold. You may have a greater risk of getting burned.   General instructions  If you were given  a sedative during the procedure, it can affect you for several hours. Do not drive or operate machinery until your health care provider says that it is safe.  For the first 24 hours after the procedure: ? Do not sign important documents. ? Do not drink alcohol. ? Do your regular daily activities at a slower pace than normal. ? Eat soft foods that are easy to digest.  Take over-the-counter and prescription medicines only as told by your health care provider.  Keep all follow-up visits as told by your health care provider. This is important. Contact a health care provider if:  You have blood in your stool 2-3 days after the procedure. Get help right away if you have:  More than a  small spotting of blood in your stool.  Large blood clots in your stool.  Swelling of your abdomen.  Nausea or vomiting.  A fever.  Increasing pain in your abdomen that is not relieved with medicine. Summary  After the procedure, it is common to have a small amount of blood in your stool. You may also have mild cramping and bloating of your abdomen.  If you were given a sedative during the procedure, it can affect you for several hours. Do not drive or operate machinery until your health care provider says that it is safe.  Get help right away if you have a lot of blood in your stool, nausea or vomiting, a fever, or increased pain in your abdomen. This information is not intended to replace advice given to you by your health care provider. Make sure you discuss any questions you have with your health care provider. Document Revised: 04/16/2019 Document Reviewed: 11/16/2018 Elsevier Patient Education  2021 [place] After This sheet gives you information about how to care for yourself after your procedure. Your health care provider may also give you more specific instructions. If you have problems or questions, contact your health care provider. What can I expect after the procedure? After the procedure, it is common to have:  Tiredness.  Forgetfulness about what happened after the procedure.  Impaired judgment for important decisions.  Nausea or vomiting.  Some difficulty with balance. Follow these instructions at home: For the time period you were told by your health care provider:  Rest as needed.  Do not participate in activities where you could fall or become injured.  Do not drive or use machinery.  Do not drink alcohol.  Do not take sleeping pills or medicines that cause drowsiness.  Do not make important decisions or sign legal documents.  Do not take care of children on your own.      Eating and drinking  Follow the  diet that is recommended by your health care provider.  Drink enough fluid to keep your urine pale yellow.  If you vomit: ? Drink water, juice, or soup when you can drink without vomiting. ? Make sure you have little or no nausea before eating solid foods. General instructions  Have a responsible adult stay with you for the time you are told. It is important to have someone help care for you until you are awake and alert.  Take over-the-counter and prescription medicines only as told by your health care provider.  If you have sleep apnea, surgery and certain medicines can increase your risk for breathing problems. Follow instructions from your health care provider about wearing your sleep device: ? Anytime you are sleeping, including during daytime naps. ? While  taking prescription pain medicines, sleeping medicines, or medicines that make you drowsy.  Avoid smoking.  Keep all follow-up visits as told by your health care provider. This is important. Contact a health care provider if:  You keep feeling nauseous or you keep vomiting.  You feel light-headed.  You are still sleepy or having trouble with balance after 24 hours.  You develop a rash.  You have a fever.  You have redness or swelling around the IV site. Get help right away if:  You have trouble breathing.  You have new-onset confusion at home. Summary  For several hours after your procedure, you may feel tired. You may also be forgetful and have poor judgment.  Have a responsible adult stay with you for the time you are told. It is important to have someone help care for you until you are awake and alert.  Rest as told. Do not drive or operate machinery. Do not drink alcohol or take sleeping pills.  Get help right away if you have trouble breathing, or if you suddenly become confused. This information is not intended to replace advice given to you by your health care provider. Make sure you discuss any questions  you have with your health care provider. Document Revised: 01/06/2020 Document Reviewed: 03/25/2019 Elsevier Patient Education  2021 [employer].

## 2020-08-10 ENCOUNTER — Other Ambulatory Visit: Payer: Self-pay

## 2020-08-10 ENCOUNTER — Encounter (HOSPITAL_COMMUNITY)
Admission: RE | Admit: 2020-08-10 | Discharge: 2020-08-10 | Disposition: A | Discharge status: Home / Self Care | Payer: Medicare Other | Source: Ambulatory Visit | Attending: Internal Medicine | Admitting: Internal Medicine

## 2020-08-10 ENCOUNTER — Encounter (HOSPITAL_COMMUNITY): Payer: Self-pay

## 2020-08-10 ENCOUNTER — Other Ambulatory Visit (HOSPITAL_COMMUNITY)
Admission: RE | Admit: 2020-08-10 | Discharge: 2020-08-10 | Disposition: A | Discharge status: Home / Self Care | Payer: Medicare Other | Source: Ambulatory Visit | Attending: Internal Medicine | Admitting: Internal Medicine

## 2020-08-10 DIAGNOSIS — Z20822 Contact with and (suspected) exposure to covid-19: Secondary | ICD-10-CM | POA: Diagnosis not present

## 2020-08-10 DIAGNOSIS — Z01818 Encounter for other preprocedural examination: Secondary | ICD-10-CM | POA: Insufficient documentation

## 2020-08-10 LAB — BASIC METABOLIC PANEL
Anion gap: 9 (ref 5–15)
BUN: 23 mg/dL — ABNORMAL HIGH (ref 6–20)
CO2: 25 mmol/L (ref 22–32)
Calcium: 9.4 mg/dL (ref 8.9–10.3)
Chloride: 105 mmol/L (ref 98–111)
Creatinine, Ser: 1.14 mg/dL (ref 0.61–1.24)
GFR, Estimated: >60 mL/min (ref >60)
Glucose, Bld: 127 mg/dL — ABNORMAL HIGH (ref 70–99)
Potassium: 4.1 mmol/L (ref 3.5–5.1)
Sodium: 139 mmol/L (ref 135–145)

## 2020-08-10 LAB — CBC WITH DIFFERENTIAL/PLATELET
Abs Immature Granulocytes: 0.01 10*3/uL (ref 0.00–0.07)
Basophils Absolute: 0 10*3/uL (ref 0.0–0.1)
Basophils Relative: 1 %
Eosinophils Absolute: 0.3 10*3/uL (ref 0.0–0.5)
Eosinophils Relative: 4 %
HCT: 35.1 % — ABNORMAL LOW (ref 39.0–52.0)
Hemoglobin: 12.2 g/dL — ABNORMAL LOW (ref 13.0–17.0)
Immature Granulocytes: 0 %
Lymphocytes Relative: 50 %
Lymphs Abs: 3.1 10*3/uL (ref 0.7–4.0)
MCH: 33.6 pg (ref 26.0–34.0)
MCHC: 34.8 g/dL (ref 30.0–36.0)
MCV: 96.7 fL (ref 80.0–100.0)
Monocytes Absolute: 0.5 10*3/uL (ref 0.1–1.0)
Monocytes Relative: 7 %
Neutro Abs: 2.4 10*3/uL (ref 1.7–7.7)
Neutrophils Relative %: 38 %
Platelets: 181 10*3/uL (ref 150–400)
RBC: 3.63 MIL/uL — ABNORMAL LOW (ref 4.22–5.81)
RDW: 14.4 % (ref 11.5–15.5)
WBC: 6.3 10*3/uL (ref 4.0–10.5)
nRBC: 0 % (ref 0.0–0.2)

## 2020-08-10 LAB — RAPID URINE DRUG SCREEN, HOSP PERFORMED
Amphetamines: NOT DETECTED
Barbiturates: NOT DETECTED
Benzodiazepines: NOT DETECTED
Cocaine: NOT DETECTED
Opiates: NOT DETECTED
Tetrahydrocannabinol: POSITIVE — AB

## 2020-08-10 LAB — SARS CORONAVIRUS 2 (TAT 6-24 HRS): SARS Coronavirus 2: NEGATIVE

## 2020-08-14 ENCOUNTER — Encounter (HOSPITAL_COMMUNITY)
Admission: RE | Disposition: A | Discharge status: Home / Self Care | Payer: Self-pay | Source: Ambulatory Visit | Attending: Internal Medicine

## 2020-08-14 ENCOUNTER — Encounter: Payer: Self-pay | Admitting: Internal Medicine

## 2020-08-14 ENCOUNTER — Ambulatory Visit (HOSPITAL_COMMUNITY): Payer: Medicare Other | Admitting: Anesthesiology

## 2020-08-14 ENCOUNTER — Ambulatory Visit (HOSPITAL_COMMUNITY)
Admission: RE | Admit: 2020-08-14 | Discharge: 2020-08-14 | Disposition: A | Discharge status: Home / Self Care | Payer: Medicare Other | Source: Ambulatory Visit | Attending: Internal Medicine | Admitting: Internal Medicine

## 2020-08-14 ENCOUNTER — Encounter (HOSPITAL_COMMUNITY): Payer: Self-pay | Admitting: Internal Medicine

## 2020-08-14 DIAGNOSIS — Z1211 Encounter for screening for malignant neoplasm of colon: Secondary | ICD-10-CM | POA: Insufficient documentation

## 2020-08-14 DIAGNOSIS — Z560 Unemployment, unspecified: Secondary | ICD-10-CM | POA: Insufficient documentation

## 2020-08-14 DIAGNOSIS — Z886 Allergy status to analgesic agent status: Secondary | ICD-10-CM | POA: Diagnosis not present

## 2020-08-14 DIAGNOSIS — Z87891 Personal history of nicotine dependence: Secondary | ICD-10-CM | POA: Insufficient documentation

## 2020-08-14 DIAGNOSIS — Z86010 Personal history of colonic polyps: Secondary | ICD-10-CM | POA: Diagnosis not present

## 2020-08-14 DIAGNOSIS — Z85528 Personal history of other malignant neoplasm of kidney: Secondary | ICD-10-CM | POA: Diagnosis not present

## 2020-08-14 DIAGNOSIS — Z79899 Other long term (current) drug therapy: Secondary | ICD-10-CM | POA: Diagnosis not present

## 2020-08-14 DIAGNOSIS — Z905 Acquired absence of kidney: Secondary | ICD-10-CM | POA: Insufficient documentation

## 2020-08-14 DIAGNOSIS — Z791 Long term (current) use of non-steroidal anti-inflammatories (NSAID): Secondary | ICD-10-CM | POA: Insufficient documentation

## 2020-08-14 HISTORY — PX: COLONOSCOPY WITH PROPOFOL: SHX5780

## 2020-08-14 SURGERY — COLONOSCOPY WITH PROPOFOL
Anesthesia: Monitor Anesthesia Care

## 2020-08-14 MED ORDER — PROPOFOL 10 MG/ML IV BOLUS
INTRAVENOUS | Status: DC | PRN
  Administered 2020-08-14: 50 mg via INTRAVENOUS
  Administered 2020-08-14: 100 mg via INTRAVENOUS
  Administered 2020-08-14 (×2): 50 mg via INTRAVENOUS

## 2020-08-14 MED ORDER — PROPOFOL 10 MG/ML IV BOLUS
INTRAVENOUS | Status: AC
  Filled 2020-08-14: qty 60

## 2020-08-14 MED ORDER — PROPOFOL 10 MG/ML IV BOLUS
INTRAVENOUS | Status: AC
  Filled 2020-08-14: qty 20

## 2020-08-14 MED ORDER — STERILE WATER FOR IRRIGATION IR SOLN
Status: DC | PRN
  Administered 2020-08-14: 200 mL

## 2020-08-14 MED ORDER — PROPOFOL 10 MG/ML IV BOLUS
INTRAVENOUS | Status: AC
  Filled 2020-08-14: qty 40

## 2020-08-14 MED ORDER — PROPOFOL 500 MG/50ML IV EMUL
INTRAVENOUS | Status: DC | PRN
  Administered 2020-08-14: 200 ug/kg/min via INTRAVENOUS

## 2020-08-14 MED ORDER — LACTATED RINGERS IV SOLN: INTRAVENOUS | Status: DC

## 2020-08-14 NOTE — Transfer of Care (Signed)
Immediate Anesthesia Transfer of Care Note  Patient: Brandon Lawrence  Procedure(s) Performed: COLONOSCOPY WITH PROPOFOL (N/A )  Patient Location: PACU  Anesthesia Type:General  Level of Consciousness: drowsy  Airway & Oxygen Therapy: Patient Spontanous Breathing and Patient connected to nasal cannula oxygen  Post-op Assessment: Report given to RN and Post -op Vital signs reviewed and stable  Post vital signs: Reviewed and stable  Last Vitals:  Vitals Value Taken Time  BP    Temp    Pulse 65 08/14/20 1413  Resp 17 08/14/20 1413  SpO2 100 % 08/14/20 1413  Vitals shown include unvalidated device data.  Last Pain:  Vitals:   08/14/20 1350  TempSrc:   PainSc: 0-No pain      Patients Stated Pain Goal: 5 (15/95/39 [id number])  Complications: No complications documented.

## 2020-08-14 NOTE — Discharge Instructions (Signed)
Colonoscopy Discharge Instructions  Read the instructions outlined below and refer to this sheet in the next few weeks. These discharge instructions provide you with general information on caring for yourself after you leave the hospital. Your doctor may also give you specific instructions. While your treatment has been planned according to the most current medical practices available, unavoidable complications occasionally occur. If you have any problems or questions after discharge, call Dr. Gala Romney at [phone]. ACTIVITY  You may resume your regular activity, but move at a slower pace for the next 24 hours.   Take frequent rest periods for the next 24 hours.   Walking will help get rid of the air and reduce the bloated feeling in your belly (abdomen).   No driving for 24 hours (because of the medicine (anesthesia) used during the test).    Do not sign any important legal documents or operate any machinery for 24 hours (because of the anesthesia used during the test).  NUTRITION  Drink plenty of fluids.   You may resume your normal diet as instructed by your doctor.   Begin with a light meal and progress to your normal diet. Heavy or fried foods are harder to digest and may make you feel sick to your stomach (nauseated).   Avoid alcoholic beverages for 24 hours or as instructed.  MEDICATIONS  You may resume your normal medications unless your doctor tells you otherwise.  WHAT YOU CAN EXPECT TODAY  Some feelings of bloating in the abdomen.   Passage of more gas than usual.   Spotting of blood in your stool or on the toilet paper.  IF YOU HAD POLYPS REMOVED DURING THE COLONOSCOPY:  No aspirin products for 7 days or as instructed.   No alcohol for 7 days or as instructed.   Eat a soft diet for the next 24 hours.  FINDING OUT THE RESULTS OF YOUR TEST Not all test results are available during your visit. If your test results are not back during the visit, make an appointment  with your caregiver to find out the results. Do not assume everything is normal if you have not heard from your caregiver or the medical facility. It is important for you to follow up on all of your test results.  SEEK IMMEDIATE MEDICAL ATTENTION IF:  You have more than a spotting of blood in your stool.   Your belly is swollen (abdominal distention).   You are nauseated or vomiting.   You have a temperature over 101.   You have abdominal pain or discomfort that is severe or gets worse throughout the day.    Your colonoscopy was normal today   I recommend you return for repeat colonoscopy in 5 years   At patient request, I called Germaine Pomfret at [phone] -discussed results and recommendations       Monitored Anesthesia Care, Care After This sheet gives you information about how to care for yourself after your procedure. Your health care provider may also give you more specific instructions. If you have problems or questions, contact your health care provider. What can I expect after the procedure? After the procedure, it is common to have:  Tiredness.  Forgetfulness about what happened after the procedure.  Impaired judgment for important decisions.  Nausea or vomiting.  Some difficulty with balance. Follow these instructions at home: For the time period you were told by your health care provider:  Rest as needed.  Do not participate in activities where you could fall  or become injured.  Do not drive or use machinery.  Do not drink alcohol.  Do not take sleeping pills or medicines that cause drowsiness.  Do not make important decisions or sign legal documents.  Do not take care of children on your own.      Eating and drinking  Follow the diet that is recommended by your health care provider.  Drink enough fluid to keep your urine pale yellow.  If you vomit: ? Drink water, juice, or soup when you can drink without vomiting. ? Make sure you have  little or no nausea before eating solid foods. General instructions  Have a responsible adult stay with you for the time you are told. It is important to have someone help care for you until you are awake and alert.  Take over-the-counter and prescription medicines only as told by your health care provider.  If you have sleep apnea, surgery and certain medicines can increase your risk for breathing problems. Follow instructions from your health care provider about wearing your sleep device: ? Anytime you are sleeping, including during daytime naps. ? While taking prescription pain medicines, sleeping medicines, or medicines that make you drowsy.  Avoid smoking.  Keep all follow-up visits as told by your health care provider. This is important. Contact a health care provider if:  You keep feeling nauseous or you keep vomiting.  You feel light-headed.  You are still sleepy or having trouble with balance after 24 hours.  You develop a rash.  You have a fever.  You have redness or swelling around the IV site. Get help right away if:  You have trouble breathing.  You have new-onset confusion at home. Summary  For several hours after your procedure, you may feel tired. You may also be forgetful and have poor judgment.  Have a responsible adult stay with you for the time you are told. It is important to have someone help care for you until you are awake and alert.  Rest as told. Do not drive or operate machinery. Do not drink alcohol or take sleeping pills.  Get help right away if you have trouble breathing, or if you suddenly become confused. This information is not intended to replace advice given to you by your health care provider. Make sure you discuss any questions you have with your health care provider. Document Revised: 01/06/2020 Document Reviewed: 03/25/2019 Elsevier Patient Education  2021 [employer].

## 2020-08-14 NOTE — Anesthesia Preprocedure Evaluation (Signed)
Anesthesia Evaluation  Patient identified by MRN, date of birth, ID band Patient awake    Reviewed: Allergy & Precautions, NPO status , Patient's Chart, lab work & pertinent test results  History of Anesthesia Complications (+) history of anesthetic complications  Airway Mallampati: II  TM Distance: >3 FB Neck ROM: Full    Dental  (+) Missing, Dental Advisory Given   Pulmonary shortness of breath and with exertion, sleep apnea , former smoker,    Pulmonary exam normal breath sounds clear to auscultation       Cardiovascular Exercise Tolerance: Good hypertension, Pt. on medications  Rhythm:Regular Rate:Bradycardia - Systolic murmurs, - Diastolic murmurs, - Friction Rub, - Carotid Bruit, - Peripheral Edema and - Systolic Click Left ventricle: The cavity size was normal. Wall thickness was  normal. Systolic function was at the lower limits of normal. The  estimated ejection fraction was in the range of 50% to 55%.  Regional wall motion abnormalities: Possible mild hypokinesis of  the apical myocardium. The transmitral flow pattern was normal. The  deceleration time of the early transmitral flow velocity was  normal. The pulmonary vein flow pattern was normal. The tissue  Doppler parameters were normal. Left ventricular diastolic function  parameters were normal.   Neuro/Psych  Headaches, PSYCHIATRIC DISORDERS Depression  Neuromuscular disease    GI/Hepatic hiatal hernia, GERD  Medicated,(+)     substance abuse  marijuana use,   Endo/Other  negative endocrine ROS  Renal/GU Renal disease (partial nephrectomy)     Musculoskeletal  (+) Arthritis ,   Abdominal   Peds  Hematology  (+) anemia ,   Anesthesia Other Findings   Reproductive/Obstetrics                             Anesthesia Physical Anesthesia Plan  ASA: III  Anesthesia Plan:    Post-op Pain Management:    Induction:    PONV Risk Score and Plan:   Airway Management Planned:   Additional Equipment:   Intra-op Plan:   Post-operative Plan:   Informed Consent:   Plan Discussed with:   Anesthesia Plan Comments:         Anesthesia Quick Evaluation

## 2020-08-14 NOTE — Anesthesia Postprocedure Evaluation (Signed)
Anesthesia Post Note  Patient: Brandon Lawrence  Procedure(s) Performed: COLONOSCOPY WITH PROPOFOL (N/A )  Patient location during evaluation: PACU Level of consciousness: awake and alert and oriented Pain management: pain level controlled Vital Signs Assessment: post-procedure vital signs reviewed and stable Respiratory status: spontaneous breathing and respiratory function stable Cardiovascular status: blood pressure returned to baseline and stable Postop Assessment: no apparent nausea or vomiting Anesthetic complications: no   No complications documented.   Last Vitals:  Vitals:   08/14/20 1415 08/14/20 1430  BP: (!) 119/48 (!) 147/82  Pulse: 66 (!) 48  Resp: 17 18  Temp: 36.5 C 36.4 C  SpO2: 100% 100%    Last Pain:  Vitals:   08/14/20 1430  TempSrc: Oral  PainSc: 0-No pain                 Alfreida Steffenhagen C Gurpreet Mikhail

## 2020-08-14 NOTE — Op Note (Signed)
[facility] Patient Name: Brandon Lawrence Procedure Date: 08/14/2020 1:29 PM MRN: [id number] Date of Birth: 09-14-1960 Attending MD: Norvel Richards , MD CSN: [id number] Age: 60 Admit Type: Outpatient Procedure:                Colonoscopy Indications:              High risk colon cancer surveillance: Personal                            history of colonic polyps Providers:                Norvel Richards, MD, Crystal Page, Aram Candela Referring MD:              Medicines:                Propofol per Anesthesia Complications:            No immediate complications. Estimated Blood Loss:     Estimated blood loss: none. Procedure:                Pre-Anesthesia Assessment:                           - Prior to the procedure, a History and Physical                            was performed, and patient medications and                            allergies were reviewed. The patient's tolerance of                            previous anesthesia was also reviewed. The risks                            and benefits of the procedure and the sedation                            options and risks were discussed with the patient.                            All questions were answered, and informed consent                            was obtained. Prior Anticoagulants: The patient has                            taken no previous anticoagulant or antiplatelet                            agents. ASA Grade Assessment: III - A patient with                            severe systemic disease. After reviewing the risks  and benefits, the patient was deemed in                            satisfactory condition to undergo the procedure.                           After obtaining informed consent, the colonoscope                            was passed under direct vision. Throughout the                            procedure, the patient's blood pressure, pulse, and                             oxygen saturations were monitored continuously. The                            CF-HQ190L ([id number]) scope was introduced through                            the anus and advanced to the the cecum, identified                            by appendiceal orifice and ileocecal valve. The                            colonoscopy was performed without difficulty. The                            patient tolerated the procedure well. The quality                            of the bowel preparation was adequate. Scope In: 1:57:11 PM Scope Out: 2:09:21 PM Scope Withdrawal Time: 0 hours 6 minutes 40 seconds  Total Procedure Duration: 0 hours 12 minutes 10 seconds  Findings:      The perianal and digital rectal examinations were normal.      The colon (entire examined portion) appeared normal.      The retroflexed view of the distal rectum and anal verge was normal and       showed no anal or rectal abnormalities. Impression:               - The entire examined colon is normal.                           - The distal rectum and anal verge are normal on                            retroflexion view.                           - No specimens collected. Moderate Sedation:      Moderate (conscious) sedation was personally administered by an       anesthesia  professional. The following parameters were monitored: oxygen       saturation, heart rate, blood pressure, respiratory rate, EKG, adequacy       of pulmonary ventilation, and response to care. Recommendation:           - Patient has a contact number available for                            emergencies. The signs and symptoms of potential                            delayed complications were discussed with the                            patient. Return to normal activities tomorrow.                            Written discharge instructions were provided to the                            patient.                           - Resume previous diet.                            - Continue present medications.                           - Repeat colonoscopy in 5 years for surveillance.                           - Return to GI office (date not yet determined). Procedure Code(s):        --- Professional ---                           [phone], Colonoscopy, flexible; diagnostic, including                            collection of specimen(s) by brushing or washing,                            when performed (separate procedure) Diagnosis Code(s):        --- Professional ---                           Z86.010, Personal history of colonic polyps CPT copyright 2019 American Medical Association. All rights reserved. The codes documented in this report are preliminary and upon coder review may  be revised to meet current compliance requirements. Cristopher Estimable. Liviana Mills, MD Norvel Richards, MD 08/14/2020 2:16:54 PM This report has been signed electronically. Number of Addenda: 0
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
## 2020-08-17 ENCOUNTER — Encounter (HOSPITAL_COMMUNITY): Payer: Self-pay | Admitting: Internal Medicine

## 2020-08-31 ENCOUNTER — Encounter (HOSPITAL_COMMUNITY): Payer: Self-pay | Admitting: Emergency Medicine

## 2020-08-31 ENCOUNTER — Other Ambulatory Visit: Payer: Self-pay

## 2020-08-31 ENCOUNTER — Emergency Department (HOSPITAL_COMMUNITY): Payer: Medicare Other

## 2020-08-31 ENCOUNTER — Emergency Department (HOSPITAL_COMMUNITY)
Admission: EM | Admit: 2020-08-31 | Discharge: 2020-08-31 | Disposition: A | Discharge status: Home / Self Care | Payer: Medicare Other | Attending: Emergency Medicine | Admitting: Emergency Medicine

## 2020-08-31 DIAGNOSIS — I10 Essential (primary) hypertension: Secondary | ICD-10-CM | POA: Diagnosis not present

## 2020-08-31 DIAGNOSIS — M79604 Pain in right leg: Secondary | ICD-10-CM | POA: Insufficient documentation

## 2020-08-31 DIAGNOSIS — Y92410 Unspecified street and highway as the place of occurrence of the external cause: Secondary | ICD-10-CM | POA: Insufficient documentation

## 2020-08-31 DIAGNOSIS — G8929 Other chronic pain: Secondary | ICD-10-CM | POA: Diagnosis not present

## 2020-08-31 DIAGNOSIS — R001 Bradycardia, unspecified: Secondary | ICD-10-CM | POA: Diagnosis not present

## 2020-08-31 DIAGNOSIS — M25552 Pain in left hip: Secondary | ICD-10-CM | POA: Diagnosis not present

## 2020-08-31 DIAGNOSIS — Z85528 Personal history of other malignant neoplasm of kidney: Secondary | ICD-10-CM | POA: Diagnosis not present

## 2020-08-31 DIAGNOSIS — Z87891 Personal history of nicotine dependence: Secondary | ICD-10-CM | POA: Insufficient documentation
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
## 2020-08-31 NOTE — ED Notes (Signed)
Pt in bed, pt states that his pain is a 7/10, states that he doesn't need anything for pain at this time
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
## 2020-08-31 NOTE — Discharge Instructions (Signed)
Today your CT scan was reassuring.  Please follow-up with your primary care doctor.  Your heart rate was low today.  Some times this can be caused by medications.  Please follow up with your primary care doctor.

## 2020-09-01 ENCOUNTER — Telehealth: Payer: Self-pay

## 2020-09-01 NOTE — Telephone Encounter (Signed)
Transition Care Management Unsuccessful Follow-up Telephone Call  Date of discharge and from where:  08/31/2020 from [facility]  Attempts:  1st Attempt  Reason for unsuccessful TCM follow-up call:  Left voice message

## 2020-09-04 NOTE — Telephone Encounter (Signed)
Transition Care Management Follow-up Telephone Call  Date of discharge and from where: 08/31/2020 from [facility]  How have you been since you were released from the hospital? Pt stated that she is feeling well today, there is still some soreness  Any questions or concerns? No  Items Reviewed:  Did the pt receive and understand the discharge instructions provided? Yes   Medications obtained and verified? Yes   Other? No   Any new allergies since your discharge? No   Dietary orders reviewed? n/a  Do you have support at home? Yes   Functional Questionnaire: (I = Independent and D = Dependent) ADLs: I  Bathing/Dressing- I  Meal Prep- I  Eating- I  Maintaining continence- I  Transferring/Ambulation- I  Managing Meds- I   Follow up appointments reviewed:   PCP Hospital f/u appt confirmed? Yes  Scheduled to see Noemi Chapel, NP on 09/26/2020 @ 09:45am.  [facility] f/u appt confirmed? No    Are transportation arrangements needed? No   If their condition worsens, is the pt aware to call PCP or go to the Emergency Dept.? Yes  Was the patient provided with contact information for the PCP's office or ED? Yes  Was to pt encouraged to call back with questions or concerns? Yes

## 2020-09-06 ENCOUNTER — Other Ambulatory Visit: Payer: Self-pay

## 2020-09-06 ENCOUNTER — Ambulatory Visit (HOSPITAL_COMMUNITY)
Admission: RE | Admit: 2020-09-06 | Discharge: 2020-09-06 | Disposition: A | Discharge status: Home / Self Care | Payer: Medicare Other | Source: Ambulatory Visit | Attending: Nurse Practitioner | Admitting: Nurse Practitioner

## 2020-09-06 ENCOUNTER — Encounter: Payer: Self-pay | Admitting: Nurse Practitioner

## 2020-09-06 ENCOUNTER — Ambulatory Visit (INDEPENDENT_AMBULATORY_CARE_PROVIDER_SITE_OTHER): Payer: Medicare Other | Admitting: Nurse Practitioner

## 2020-09-06 VITALS — BP 140/60 | HR 64 | Temp 98.2°F | Ht 66.0 in | Wt 122.0 lb

## 2020-09-06 DIAGNOSIS — L989 Disorder of the skin and subcutaneous tissue, unspecified: Secondary | ICD-10-CM

## 2020-09-06 DIAGNOSIS — M5442 Lumbago with sciatica, left side: Secondary | ICD-10-CM

## 2020-09-06 DIAGNOSIS — M5441 Lumbago with sciatica, right side: Secondary | ICD-10-CM

## 2020-09-06 MED ORDER — TIZANIDINE HCL 4 MG PO TABS: 4.0000 mg | ORAL_TABLET | Freq: Four times a day (QID) | ORAL | 0 refills | Status: DC | PRN
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
## 2020-09-08 ENCOUNTER — Telehealth: Payer: Self-pay | Admitting: Nurse Practitioner

## 2020-09-08 NOTE — Telephone Encounter (Signed)
[facility] called and stated that they are no longer accepting new medicaid referral pts at this time.

## 2020-09-11 NOTE — Telephone Encounter (Signed)
Spoke with referral clerk, pt does not have medicaid

## 2020-09-12 ENCOUNTER — Other Ambulatory Visit: Payer: Self-pay

## 2020-09-12 DIAGNOSIS — M5441 Lumbago with sciatica, right side: Secondary | ICD-10-CM

## 2020-09-12 DIAGNOSIS — M5442 Lumbago with sciatica, left side: Secondary | ICD-10-CM

## 2020-09-12 NOTE — Progress Notes (Signed)
Pt decided to do PT, referral placed

## 2020-09-26 ENCOUNTER — Ambulatory Visit: Payer: Medicare Other | Admitting: Nurse Practitioner

## 2020-09-27 ENCOUNTER — Other Ambulatory Visit: Payer: Self-pay

## 2020-09-27 ENCOUNTER — Encounter: Payer: Self-pay | Admitting: Nurse Practitioner

## 2020-09-27 ENCOUNTER — Ambulatory Visit (INDEPENDENT_AMBULATORY_CARE_PROVIDER_SITE_OTHER): Payer: Medicare Other | Admitting: Nurse Practitioner

## 2020-09-27 VITALS — BP 106/84 | HR 84 | Temp 97.2°F | Ht 66.0 in | Wt 122.0 lb

## 2020-09-27 DIAGNOSIS — I10 Essential (primary) hypertension: Secondary | ICD-10-CM | POA: Diagnosis not present

## 2020-09-27 DIAGNOSIS — Z0001 Encounter for general adult medical examination with abnormal findings: Secondary | ICD-10-CM | POA: Diagnosis not present

## 2020-09-27 DIAGNOSIS — N400 Enlarged prostate without lower urinary tract symptoms: Secondary | ICD-10-CM | POA: Diagnosis not present

## 2020-09-27 DIAGNOSIS — K219 Gastro-esophageal reflux disease without esophagitis: Secondary | ICD-10-CM | POA: Diagnosis not present

## 2020-09-27 DIAGNOSIS — Z0000 Encounter for general adult medical examination without abnormal findings: Secondary | ICD-10-CM
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
## 2020-09-28 ENCOUNTER — Other Ambulatory Visit: Payer: Medicare Other

## 2020-09-28 DIAGNOSIS — N400 Enlarged prostate without lower urinary tract symptoms: Secondary | ICD-10-CM

## 2020-09-28 DIAGNOSIS — K219 Gastro-esophageal reflux disease without esophagitis: Secondary | ICD-10-CM

## 2020-09-28 DIAGNOSIS — I10 Essential (primary) hypertension: Secondary | ICD-10-CM

## 2020-09-29 LAB — COMPLETE METABOLIC PANEL WITH GFR
AG Ratio: 1.5 (calc) (ref 1.0–2.5)
ALT: 16 U/L (ref 9–46)
AST: 22 U/L (ref 10–35)
Albumin: 4.1 g/dL (ref 3.6–5.1)
Alkaline phosphatase (APISO): 33 U/L — ABNORMAL LOW (ref 35–144)
BUN: 24 mg/dL (ref 7–25)
CO2: 28 mmol/L (ref 20–32)
Calcium: 9.9 mg/dL (ref 8.6–10.3)
Chloride: 105 mmol/L (ref 98–110)
Creat: 1.11 mg/dL (ref 0.70–1.33)
GFR, Est African American: 84 mL/min/{1.73_m2} (ref >=60)
GFR, Est Non African American: 73 mL/min/{1.73_m2} (ref >=60)
Globulin: 2.7 g/dL (calc) (ref 1.9–3.7)
Glucose, Bld: 92 mg/dL (ref 65–99)
Potassium: 4.9 mmol/L (ref 3.5–5.3)
Sodium: 139 mmol/L (ref 135–146)
Total Bilirubin: 0.4 mg/dL (ref 0.2–1.2)
Total Protein: 6.8 g/dL (ref 6.1–8.1)

## 2020-09-29 LAB — CBC WITH DIFFERENTIAL/PLATELET
Absolute Monocytes: 847 cells/uL (ref 200–950)
Basophils Absolute: 28 cells/uL (ref 0–200)
Basophils Relative: 0.4 %
Eosinophils Absolute: 546 cells/uL — ABNORMAL HIGH (ref 15–500)
Eosinophils Relative: 7.8 %
HCT: 35.8 % — ABNORMAL LOW (ref 38.5–50.0)
Hemoglobin: 12 g/dL — ABNORMAL LOW (ref 13.2–17.1)
Lymphs Abs: 3129 cells/uL (ref 850–3900)
MCH: 32.5 pg (ref 27.0–33.0)
MCHC: 33.5 g/dL (ref 32.0–36.0)
MCV: 97 fL (ref 80.0–100.0)
MPV: 10.6 fL (ref 7.5–12.5)
Monocytes Relative: 12.1 %
Neutro Abs: 2450 cells/uL (ref 1500–7800)
Neutrophils Relative %: 35 %
Platelets: 161 10*3/uL (ref 140–400)
RBC: 3.69 10*6/uL — ABNORMAL LOW (ref 4.20–5.80)
RDW: 13.2 % (ref 11.0–15.0)
Total Lymphocyte: 44.7 %
WBC: 7 10*3/uL (ref 3.8–10.8)

## 2020-09-29 LAB — LIPID PANEL
Cholesterol: 138 mg/dL (ref <200)
HDL: 40 mg/dL (ref >=40)
LDL Cholesterol (Calc): 83 mg/dL (calc)
Non-HDL Cholesterol (Calc): 98 mg/dL (calc) (ref <130)
Total CHOL/HDL Ratio: 3.5 (calc) (ref <5.0)
Triglycerides: 69 mg/dL (ref <150)

## 2020-09-29 LAB — MAGNESIUM: Magnesium: 1.8 mg/dL (ref 1.5–2.5)

## 2020-09-29 LAB — PSA: PSA: 0.75 ng/mL (ref <=4.00)
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
## 2020-10-05 ENCOUNTER — Ambulatory Visit: Payer: Medicare Other | Admitting: Nurse Practitioner

## 2020-10-05 ENCOUNTER — Encounter: Payer: Self-pay | Admitting: Gastroenterology

## 2020-10-05 ENCOUNTER — Other Ambulatory Visit: Payer: Self-pay

## 2020-10-05 ENCOUNTER — Ambulatory Visit (INDEPENDENT_AMBULATORY_CARE_PROVIDER_SITE_OTHER): Payer: Medicare Other | Admitting: Gastroenterology

## 2020-10-05 VITALS — BP 133/62 | HR 59 | Temp 97.5°F | Ht 66.0 in | Wt 127.8 lb

## 2020-10-05 DIAGNOSIS — R112 Nausea with vomiting, unspecified: Secondary | ICD-10-CM

## 2020-10-05 DIAGNOSIS — R634 Abnormal weight loss: Secondary | ICD-10-CM

## 2020-10-05 DIAGNOSIS — D508 Other iron deficiency anemias: Secondary | ICD-10-CM

## 2020-10-05 DIAGNOSIS — R1013 Epigastric pain: Secondary | ICD-10-CM | POA: Diagnosis not present

## 2020-10-05 DIAGNOSIS — D649 Anemia, unspecified: Secondary | ICD-10-CM | POA: Insufficient documentation

## 2020-10-05 DIAGNOSIS — K219 Gastro-esophageal reflux disease without esophagitis: Secondary | ICD-10-CM

## 2020-10-05 DIAGNOSIS — K5909 Other constipation: Secondary | ICD-10-CM

## 2020-10-05 DIAGNOSIS — R1310 Dysphagia, unspecified: Secondary | ICD-10-CM

## 2020-10-05 MED ORDER — ONDANSETRON HCL 4 MG PO TABS: 4.0000 mg | ORAL_TABLET | Freq: Three times a day (TID) | ORAL | 0 refills | Status: DC | PRN

## 2020-10-05 MED ORDER — FAMOTIDINE 20 MG PO TABS: 20.0000 mg | ORAL_TABLET | Freq: Every day | ORAL | 3 refills | Status: DC

## 2020-10-05 NOTE — Patient Instructions (Addendum)
Please have blood work completed at [facility] to recheck your iron levels.  We will arrange for you to have an upper endoscopy with possible dilation of your esophagus in the near future with Dr. Gala Romney. Hold iron for 7 days prior to your procedure.  I have sent in Zofran 4 mg to your pharmacy.  You may take this medication every 8 hours as needed for nausea/vomiting.  Start famotidine 20 mg every evening before bedtime.  Continue Dexilant 60 mg daily for acid reflux.  Follow a GERD diet:  Avoid fried, fatty, greasy, spicy, citrus foods. Avoid caffeine and carbonated beverages. Avoid chocolate. Try eating 4-6 small meals a day rather than 3 large meals. Do not eat within 3 hours of laying down. Prop head of bed up on wood or bricks to create a 6 inch incline.  Limit Celebrex as much as possible and use Tylenol as needed for pain.  No more than 3000 mg of Tylenol per day.  Avoid all NSAID products including ibuprofen, Aleve, Advil, Goody powders, BC powders, and anything that says "NSAID" on the package.  Continue Linzess 72 mcg daily 30 minutes before your first meal for constipation.  If this medication does not continue to work well for you, please let me know.  We will see you back after your procedure.  Do not hesitate to call with any questions or concerns prior.  Aliene Altes, PA-C [facility]    Gastroesophageal Reflux Disease, Adult  Gastroesophageal reflux (GER) happens when acid from the stomach flows up into the tube that connects the mouth and the stomach (esophagus). Normally, food travels down the esophagus and stays in the stomach to be digested. With GER, food and stomach acid sometimes move back up into the esophagus. You may have a disease called gastroesophageal reflux disease (GERD) if the reflux:  Happens often.  Causes frequent or very bad symptoms.  Causes problems such as damage to the esophagus. When this happens, the esophagus becomes  sore and swollen. Over time, GERD can make small holes (ulcers) in the lining of the esophagus. What are the causes? This condition is caused by a problem with the muscle between the esophagus and the stomach. When this muscle is weak or not normal, it does not close properly to keep food and acid from coming back up from the stomach. The muscle can be weak because of:  Tobacco use.  Pregnancy.  Having a certain type of hernia (hiatal hernia).  Alcohol use.  Certain foods and drinks, such as coffee, chocolate, onions, and peppermint. What increases the risk?  Being overweight.  Having a disease that affects your connective tissue.  Taking NSAIDs, such a ibuprofen. What are the signs or symptoms?  Heartburn.  Difficult or painful swallowing.  The feeling of having a lump in the throat.  A bitter taste in the mouth.  Bad breath.  Having a lot of saliva.  Having an upset or bloated stomach.  Burping.  Chest pain. Different conditions can cause chest pain. Make sure you see your doctor if you have chest pain.  Shortness of breath or wheezing.  A long-term cough or a cough at night.  Wearing away of the surface of teeth (tooth enamel).  Weight loss. How is this treated?  Making changes to your diet.  Taking medicine.  Having surgery. Treatment will depend on how bad your symptoms are. Follow these instructions at home: Eating and drinking  Follow a diet as told by your doctor. You  may need to avoid foods and drinks such as: ? Coffee and tea, with or without caffeine. ? Drinks that contain alcohol. ? Energy drinks and sports drinks. ? Bubbly (carbonated) drinks or sodas. ? Chocolate and cocoa. ? Peppermint and mint flavorings. ? Garlic and onions. ? Horseradish. ? Spicy and acidic foods. These include peppers, chili powder, curry powder, vinegar, hot sauces, and BBQ sauce. ? Citrus fruit juices and citrus fruits, such as oranges, lemons, and  limes. ? Tomato-based foods. These include red sauce, chili, salsa, and pizza with red sauce. ? Fried and fatty foods. These include donuts, french fries, potato chips, and high-fat dressings. ? High-fat meats. These include hot dogs, rib eye steak, sausage, ham, and bacon. ? High-fat dairy items, such as whole milk, butter, and cream cheese.  Eat small meals often. Avoid eating large meals.  Avoid drinking large amounts of liquid with your meals.  Avoid eating meals during the 2-3 hours before bedtime.  Avoid lying down right after you eat.  Do not exercise right after you eat.   Lifestyle  Do not smoke or use any products that contain nicotine or tobacco. If you need help quitting, ask your doctor.  Try to lower your stress. If you need help doing this, ask your doctor.  If you are overweight, lose an amount of weight that is healthy for you. Ask your doctor about a safe weight loss goal.   General instructions  Pay attention to any changes in your symptoms.  Take over-the-counter and prescription medicines only as told by your doctor.  Do not take aspirin, ibuprofen, or other NSAIDs unless your doctor says it is okay.  Wear loose clothes. Do not wear anything tight around your waist.  Raise (elevate) the head of your bed about 6 inches (15 cm). You may need to use a wedge to do this.  Avoid bending over if this makes your symptoms worse.  Keep all follow-up visits. Contact a doctor if:  You have new symptoms.  You lose weight and you do not know why.  You have trouble swallowing or it hurts to swallow.  You have wheezing or a cough that keeps happening.  You have a hoarse voice.  Your symptoms do not get better with treatment. Get help right away if:  You have sudden pain in your arms, neck, jaw, teeth, or back.  You suddenly feel sweaty, dizzy, or light-headed.  You have chest pain or shortness of breath.  You vomit and the vomit is green, yellow, or  black, or it looks like blood or coffee grounds.  You faint.  Your poop (stool) is red, bloody, or black.  You cannot swallow, drink, or eat. These symptoms may represent a serious problem that is an emergency. Do not wait to see if the symptoms will go away. Get medical help right away. Call your local emergency services (911 in the [place] Do not drive yourself to the hospital. Summary  If a person has gastroesophageal reflux disease (GERD), food and stomach acid move back up into the esophagus and cause symptoms or problems such as damage to the esophagus.  Treatment will depend on how bad your symptoms are.  Follow a diet as told by your doctor.  Take all medicines only as told by your doctor. This information is not intended to replace advice given to you by your health care provider. Make sure you discuss any questions you have with your health care provider. Document Revised: 11/01/2019 Document Reviewed:  11/01/2019 Elsevier Patient Education  2021 [place] for Gastroesophageal Reflux Disease, Adult When you have gastroesophageal reflux disease (GERD), the foods you eat and your eating habits are very important. Choosing the right foods can help ease the discomfort of GERD. Consider working with a dietitian to help you make healthy food choices. What are tips for following this plan? Reading food labels  Look for foods that are low in saturated fat. Foods that have less than 5% of daily value (DV) of fat and 0 g of trans fats may help with your symptoms. Cooking  Cook foods using methods other than frying. This may include baking, steaming, grilling, or broiling. These are all methods that do not need a lot of fat for cooking.  To add flavor, try to use herbs that are low in spice and acidity. Meal planning  Choose healthy foods that are low in fat, such as fruits, vegetables, whole grains, low-fat dairy products, lean meats, fish, and poultry.  Eat  frequent, small meals instead of three large meals each day. Eat your meals slowly, in a relaxed setting. Avoid bending over or lying down until 2-3 hours after eating.  Limit high-fat foods such as fatty meats or fried foods.  Limit your intake of fatty foods, such as oils, butter, and shortening.  Avoid the following as told by your health care provider: ? Foods that cause symptoms. These may be different for different people. Keep a food diary to keep track of foods that cause symptoms. ? Alcohol. ? Drinking large amounts of liquid with meals. ? Eating meals during the 2-3 hours before bed.   Lifestyle  Maintain a healthy weight. Ask your health care provider what weight is healthy for you. If you need to lose weight, work with your health care provider to do so safely.  Exercise for at least 30 minutes on 5 or more days each week, or as told by your health care provider.  Avoid wearing clothes that fit tightly around your waist and chest.  Do not use any products that contain nicotine or tobacco. These products include cigarettes, chewing tobacco, and vaping devices, such as e-cigarettes. If you need help quitting, ask your health care provider.  Sleep with the head of your bed raised. Use a wedge under the mattress or blocks under the bed frame to raise the head of the bed.  Chew sugar-free gum after mealtimes. What foods should I eat? Eat a healthy, well-balanced diet of fruits, vegetables, whole grains, low-fat dairy products, lean meats, fish, and poultry. Each person is different. Foods that may trigger symptoms in one person may not trigger any symptoms in another person. Work with your health care provider to identify foods that are safe for you. The items listed above may not be a complete list of recommended foods and beverages. Contact a dietitian for more information.   What foods should I avoid? Limiting some of these foods may help manage the symptoms of GERD. Everyone is  different. Consult a dietitian or your health care provider to help you identify the exact foods to avoid, if any. Fruits Any fruits prepared with added fat. Any fruits that cause symptoms. For some people this may include citrus fruits, such as oranges, grapefruit, pineapple, and lemons. Vegetables Deep-fried vegetables. [place] fries. Any vegetables prepared with added fat. Any vegetables that cause symptoms. For some people, this may include tomatoes and tomato products, chili peppers, onions and garlic, and horseradish. Grains Pastries or  quick breads with added fat. Meats and other proteins High-fat meats, such as fatty beef or pork, hot dogs, ribs, ham, sausage, salami, and bacon. Fried meat or protein, including fried fish and fried chicken. Nuts and nut butters, in large amounts. Dairy Whole milk and chocolate milk. Sour cream. Cream. Ice cream. Cream cheese. Milkshakes. Fats and oils Butter. Margarine. Shortening. Ghee. Beverages Coffee and tea, with or without caffeine. Carbonated beverages. Sodas. Energy drinks. Fruit juice made with acidic fruits, such as orange or grapefruit. Tomato juice. Alcoholic drinks. Sweets and desserts Chocolate and cocoa. Donuts. Seasonings and condiments Pepper. Peppermint and spearmint. Added salt. Any condiments, herbs, or seasonings that cause symptoms. For some people, this may include curry, hot sauce, or vinegar-based salad dressings. The items listed above may not be a complete list of foods and beverages to avoid. Contact a dietitian for more information. Questions to ask your health care provider Diet and lifestyle changes are usually the first steps that are taken to manage symptoms of GERD. If diet and lifestyle changes do not improve your symptoms, talk with your health care provider about taking medicines. Where to find more information  International Foundation for Gastrointestinal Disorders: aboutgerd.org Summary  When you have  gastroesophageal reflux disease (GERD), food and lifestyle choices may be very helpful in easing the discomfort of GERD.  Eat frequent, small meals instead of three large meals each day. Eat your meals slowly, in a relaxed setting. Avoid bending over or lying down until 2-3 hours after eating.  Limit high-fat foods such as fatty meats or fried foods. This information is not intended to replace advice given to you by your health care provider. Make sure you discuss any questions you have with your health care provider. Document Revised: 11/01/2019 Document Reviewed: 11/01/2019 Elsevier Patient Education  [place].

## 2020-10-06 ENCOUNTER — Telehealth: Payer: Self-pay

## 2020-10-06 ENCOUNTER — Other Ambulatory Visit: Payer: Self-pay

## 2020-10-06 LAB — IRON,TIBC AND FERRITIN PANEL
%SAT: 24 % (calc) (ref 20–48)
Ferritin: 143 ng/mL (ref 38–380)
Iron: 65 ug/dL (ref 50–180)
TIBC: 276 mcg/dL (calc) (ref 250–425)

## 2020-10-06 NOTE — Telephone Encounter (Signed)
Called pt, EGD/-/+DIL w/Propofol ASA 3 w/Dr. Gala Romney scheduled for 11/17/20 at 12:00pm. Orders entered.  PA for EGD/-/+DIL submitted via [facility] website. PA# [id number], valid 11/17/20-02/15/21.

## 2020-10-09 NOTE — Telephone Encounter (Signed)
Pre-op appt 11/15/20. Appt letter mailed with procedure instructions. 

## 2020-10-23 ENCOUNTER — Other Ambulatory Visit: Payer: Self-pay | Admitting: *Deleted

## 2020-10-23 DIAGNOSIS — D508 Other iron deficiency anemias: Secondary | ICD-10-CM

## 2020-11-04 DIAGNOSIS — G4733 Obstructive sleep apnea (adult) (pediatric): Secondary | ICD-10-CM | POA: Diagnosis not present

## 2020-11-13 NOTE — Patient Instructions (Signed)
RONI SCOW  11/13/2020     @PREFPERIOPPHARMACY @   Your procedure is scheduled on  11/17/2020.   Report to [facility] at  1015 A.M.   Call this number if you have problems the morning of surgery:  [phone]   Remember:  Follow the diet and prep instructions given to you by the office.    Take these medicines the morning of surgery with A SIP OF WATER        celebrex, dexilant, cardura,    imitrex(if needed), zanaflex (if needed).     Do not wear jewelry, make-up or nail polish.  Do not wear lotions, powders, or perfumes, or deodorant.  Do not shave 48 hours prior to surgery.  Men may shave face and neck.  Do not bring valuables to the hospital.  [facility] is not responsible for any belongings or valuables.  Contacts, dentures or bridgework may not be worn into surgery.  Leave your suitcase in the car.  After surgery it may be brought to your room.  For patients admitted to the hospital, discharge time will be determined by your treatment team.  Patients discharged the day of surgery will not be allowed to drive home and must have someone with them for 24 hours.    Special instructions:    DO NOT smoke tobacco or vape fore 24 hours before your procedure.  Please read over the following fact sheets that you were given. Anesthesia Post-op Instructions and Care and Recovery After Surgery      Upper Endoscopy, Adult, Care After This sheet gives you information about how to care for yourself after your procedure. Your health care provider may also give you more specific instructions. If you have problems or questions, contact your health careprovider. What can I expect after the procedure? After the procedure, it is common to have: A sore throat. Mild stomach pain or discomfort. Bloating. Nausea. Follow these instructions at home:  Follow instructions from your health care provider about what to eat or drink after your procedure. Return to your  normal activities as told by your health care provider. Ask your health care provider what activities are safe for you. Take over-the-counter and prescription medicines only as told by your health care provider. If you were given a sedative during the procedure, it can affect you for several hours. Do not drive or operate machinery until your health care provider says that it is safe. Keep all follow-up visits as told by your health care provider. This is important. Contact a health care provider if you have: A sore throat that lasts longer than one day. Trouble swallowing. Get help right away if: You vomit blood or your vomit looks like coffee grounds. You have: A fever. Bloody, black, or tarry stools. A severe sore throat or you cannot swallow. Difficulty breathing. Severe pain in your chest or abdomen. Summary After the procedure, it is common to have a sore throat, mild stomach discomfort, bloating, and nausea. If you were given a sedative during the procedure, it can affect you for several hours. Do not drive or operate machinery until your health care provider says that it is safe. Follow instructions from your health care provider about what to eat or drink after your procedure. Return to your normal activities as told by your health care provider. This information is not intended to replace advice given to you by your health care provider. Make sure you discuss  any questions you have with your healthcare provider. Document Revised: 04/20/2019 Document Reviewed: 09/22/2017 Elsevier Patient Education  2022 [place]. https://www.asge.org/home/for-patients/patient-information/understanding-eso-dilation-updated">  Esophageal Dilatation Esophageal dilatation, also called esophageal dilation, is a procedure to widen or open a blocked or narrowed part of the esophagus. The esophagus is the part of the body that moves food and liquid from the mouth to the stomach. You may need this  procedure if: You have a buildup of scar tissue in your esophagus that makes it difficult, painful, or impossible to swallow. This can be caused by gastroesophageal reflux disease (GERD). You have cancer of the esophagus. There is a problem with how food moves through your esophagus. In some cases, you may need this procedure repeated at a later time to dilatethe esophagus gradually. Tell a health care provider about: Any allergies you have. All medicines you are taking, including vitamins, herbs, eye drops, creams, and over-the-counter medicines. Any problems you or family members have had with anesthetic medicines. Any blood disorders you have. Any surgeries you have had. Any medical conditions you have. Any antibiotic medicines you are required to take before dental procedures. Whether you are pregnant or may be pregnant. What are the risks? Generally, this is a safe procedure. However, problems may occur, including: Bleeding due to a tear in the lining of the esophagus. A hole, or perforation, in the esophagus. What happens before the procedure? Ask your health care provider about: Changing or stopping your regular medicines. This is especially important if you are taking diabetes medicines or blood thinners. Taking medicines such as aspirin and ibuprofen. These medicines can thin your blood. Do not take these medicines unless your health care provider tells you to take them. Taking over-the-counter medicines, vitamins, herbs, and supplements. Follow instructions from your health care provider about eating or drinking restrictions. Plan to have a responsible adult take you home from the hospital or clinic. Plan to have a responsible adult care for you for the time you are told after you leave the hospital or clinic. This is important. What happens during the procedure? You may be given a medicine to help you relax (sedative). A numbing medicine may be sprayed into the back of your  throat, or you may gargle the medicine. Your health care provider may perform the dilatation using various surgical instruments, such as: Simple dilators. This instrument is carefully placed in the esophagus to stretch it. Guided wire bougies. This involves using an endoscope to insert a wire into the esophagus. A dilator is passed over this wire to enlarge the esophagus. Then the wire is removed. Balloon dilators. An endoscope with a small balloon is inserted into the esophagus. The balloon is inflated to stretch the esophagus and open it up. The procedure may vary among health care providers and hospitals. What can I expect after the procedure? Your blood pressure, heart rate, breathing rate, and blood oxygen level will be monitored until you leave the hospital or clinic. Your throat may feel slightly sore and numb. This will get better over time. You will not be allowed to eat or drink until your throat is no longer numb. When you are able to drink, urinate, and sit on the edge of the bed without nausea or dizziness, you may be able to return home. Follow these instructions at home: Take over-the-counter and prescription medicines only as told by your health care provider. If you were given a sedative during the procedure, it can affect you for several hours. Do not  drive or operate machinery until your health care provider says that it is safe. Plan to have a responsible adult care for you for the time you are told. This is important. Follow instructions from your health care provider about any eating or drinking restrictions. Do not use any products that contain nicotine or tobacco, such as cigarettes, e-cigarettes, and chewing tobacco. If you need help quitting, ask your health care provider. Keep all follow-up visits. This is important. Contact a health care provider if: You have a fever. You have pain that is not relieved by medicine. Get help right away if: You have chest pain. You  have trouble breathing. You have trouble swallowing. You vomit blood. You have black, tarry, or bloody stools. These symptoms may represent a serious problem that is an emergency. Do not wait to see if the symptoms will go away. Get medical help right away. Call your local emergency services (911 in the [place] Do not drive yourself to the hospital. Summary Esophageal dilatation, also called esophageal dilation, is a procedure to widen or open a blocked or narrowed part of the esophagus. Plan to have a responsible adult take you home from the hospital or clinic. For this procedure, a numbing medicine may be sprayed into the back of your throat, or you may gargle the medicine. Do not drive or operate machinery until your health care provider says that it is safe. This information is not intended to replace advice given to you by your health care provider. Make sure you discuss any questions you have with your healthcare provider. Document Revised: 09/08/2019 Document Reviewed: 09/08/2019 Elsevier Patient Education  [place] After This sheet gives you information about how to care for yourself after your procedure. Your health care provider may also give you more specific instructions. If you have problems or questions, contact your health careprovider. What can I expect after the procedure? After the procedure, it is common to have: Tiredness. Forgetfulness about what happened after the procedure. Impaired judgment for important decisions. Nausea or vomiting. Some difficulty with balance. Follow these instructions at home: For the time period you were told by your health care provider:     Rest as needed. Do not participate in activities where you could fall or become injured. Do not drive or use machinery. Do not drink alcohol. Do not take sleeping pills or medicines that cause drowsiness. Do not make important decisions or sign legal  documents. Do not take care of children on your own. Eating and drinking Follow the diet that is recommended by your health care provider. Drink enough fluid to keep your urine pale yellow. If you vomit: Drink water, juice, or soup when you can drink without vomiting. Make sure you have little or no nausea before eating solid foods. General instructions Have a responsible adult stay with you for the time you are told. It is important to have someone help care for you until you are awake and alert. Take over-the-counter and prescription medicines only as told by your health care provider. If you have sleep apnea, surgery and certain medicines can increase your risk for breathing problems. Follow instructions from your health care provider about wearing your sleep device: Anytime you are sleeping, including during daytime naps. While taking prescription pain medicines, sleeping medicines, or medicines that make you drowsy. Avoid smoking. Keep all follow-up visits as told by your health care provider. This is important. Contact a health care provider if: You keep feeling  nauseous or you keep vomiting. You feel light-headed. You are still sleepy or having trouble with balance after 24 hours. You develop a rash. You have a fever. You have redness or swelling around the IV site. Get help right away if: You have trouble breathing. You have new-onset confusion at home. Summary For several hours after your procedure, you may feel tired. You may also be forgetful and have poor judgment. Have a responsible adult stay with you for the time you are told. It is important to have someone help care for you until you are awake and alert. Rest as told. Do not drive or operate machinery. Do not drink alcohol or take sleeping pills. Get help right away if you have trouble breathing, or if you suddenly become confused. This information is not intended to replace advice given to you by your health care  provider. Make sure you discuss any questions you have with your healthcare provider. Document Revised: 01/06/2020 Document Reviewed: 03/25/2019 Elsevier Patient Education  2022 [employer].

## 2020-11-15 ENCOUNTER — Other Ambulatory Visit: Payer: Self-pay

## 2020-11-15 ENCOUNTER — Encounter (HOSPITAL_COMMUNITY)
Admission: RE | Admit: 2020-11-15 | Discharge: 2020-11-15 | Disposition: A | Discharge status: Home / Self Care | Payer: Medicare Other | Source: Ambulatory Visit | Attending: Internal Medicine | Admitting: Internal Medicine

## 2020-11-17 ENCOUNTER — Ambulatory Visit (HOSPITAL_COMMUNITY): Payer: Medicare Other | Admitting: Certified Registered Nurse Anesthetist

## 2020-11-17 ENCOUNTER — Encounter (HOSPITAL_COMMUNITY)
Admission: RE | Disposition: A | Discharge status: Home / Self Care | Payer: Self-pay | Source: Ambulatory Visit | Attending: Internal Medicine

## 2020-11-17 ENCOUNTER — Other Ambulatory Visit: Payer: Self-pay

## 2020-11-17 ENCOUNTER — Ambulatory Visit (HOSPITAL_COMMUNITY)
Admission: RE | Admit: 2020-11-17 | Discharge: 2020-11-17 | Disposition: A | Discharge status: Home / Self Care | Payer: Medicare Other | Source: Ambulatory Visit | Attending: Internal Medicine | Admitting: Internal Medicine

## 2020-11-17 ENCOUNTER — Encounter (HOSPITAL_COMMUNITY): Payer: Self-pay | Admitting: Internal Medicine

## 2020-11-17 DIAGNOSIS — Z85528 Personal history of other malignant neoplasm of kidney: Secondary | ICD-10-CM | POA: Diagnosis not present

## 2020-11-17 DIAGNOSIS — Z87891 Personal history of nicotine dependence: Secondary | ICD-10-CM | POA: Insufficient documentation

## 2020-11-17 DIAGNOSIS — Z79899 Other long term (current) drug therapy: Secondary | ICD-10-CM | POA: Insufficient documentation

## 2020-11-17 DIAGNOSIS — K219 Gastro-esophageal reflux disease without esophagitis: Secondary | ICD-10-CM | POA: Diagnosis not present

## 2020-11-17 DIAGNOSIS — K449 Diaphragmatic hernia without obstruction or gangrene: Secondary | ICD-10-CM | POA: Diagnosis not present

## 2020-11-17 DIAGNOSIS — R1310 Dysphagia, unspecified: Secondary | ICD-10-CM | POA: Diagnosis not present

## 2020-11-17 DIAGNOSIS — Z791 Long term (current) use of non-steroidal anti-inflammatories (NSAID): Secondary | ICD-10-CM | POA: Diagnosis not present

## 2020-11-17 DIAGNOSIS — G4730 Sleep apnea, unspecified: Secondary | ICD-10-CM | POA: Diagnosis not present

## 2020-11-17 DIAGNOSIS — Z886 Allergy status to analgesic agent status: Secondary | ICD-10-CM | POA: Diagnosis not present

## 2020-11-17 HISTORY — PX: MALONEY DILATION: SHX5535

## 2020-11-17 HISTORY — PX: ESOPHAGOGASTRODUODENOSCOPY (EGD) WITH PROPOFOL: SHX5813

## 2020-11-17 SURGERY — ESOPHAGOGASTRODUODENOSCOPY (EGD) WITH PROPOFOL
Anesthesia: General

## 2020-11-17 MED ORDER — PROPOFOL 10 MG/ML IV BOLUS
INTRAVENOUS | Status: DC | PRN
  Administered 2020-11-17: 20 mg via INTRAVENOUS
  Administered 2020-11-17: 100 mg via INTRAVENOUS
  Administered 2020-11-17: 30 mg via INTRAVENOUS
  Administered 2020-11-17: 20 mg via INTRAVENOUS
  Administered 2020-11-17: 30 mg via INTRAVENOUS

## 2020-11-17 MED ORDER — LIDOCAINE HCL (CARDIAC) PF 100 MG/5ML IV SOSY
PREFILLED_SYRINGE | INTRAVENOUS | Status: DC | PRN
  Administered 2020-11-17: 50 mg via INTRAVENOUS

## 2020-11-17 MED ORDER — SODIUM CHLORIDE 0.9 % IV SOLN: INTRAVENOUS | Status: DC | PRN

## 2020-11-17 MED ORDER — EPHEDRINE SULFATE 50 MG/ML IJ SOLN
INTRAMUSCULAR | Status: DC | PRN
  Administered 2020-11-17: 5 mg via INTRAVENOUS

## 2020-11-17 NOTE — Anesthesia Preprocedure Evaluation (Signed)
Anesthesia Evaluation  Patient identified by MRN, date of birth, ID band Patient awake    Reviewed: Allergy & Precautions, H&P , NPO status , Patient's Chart, lab work & pertinent test results, reviewed documented beta blocker date and time   Airway Mallampati: II  TM Distance: >3 FB Neck ROM: full    Dental no notable dental hx.    Pulmonary shortness of breath and with exertion, sleep apnea , Patient abstained from smoking., former smoker,    Pulmonary exam normal breath sounds clear to auscultation       Cardiovascular Exercise Tolerance: Good hypertension, negative cardio ROS   Rhythm:regular Rate:Normal     Neuro/Psych  Headaches, PSYCHIATRIC DISORDERS Depression  Neuromuscular disease    GI/Hepatic Neg liver ROS, hiatal hernia, GERD  Medicated,  Endo/Other  negative endocrine ROS  Renal/GU Renal disease  negative genitourinary   Musculoskeletal   Abdominal   Peds  Hematology  (+) Blood dyscrasia, anemia ,   Anesthesia Other Findings   Reproductive/Obstetrics negative OB ROS                             Anesthesia Physical Anesthesia Plan  ASA: 2  Anesthesia Plan: General   Post-op Pain Management:    Induction:   PONV Risk Score and Plan: Propofol infusion  Airway Management Planned:   Additional Equipment:   Intra-op Plan:   Post-operative Plan:   Informed Consent: I have reviewed the patients History and Physical, chart, labs and discussed the procedure including the risks, benefits and alternatives for the proposed anesthesia with the patient or authorized representative who has indicated his/her understanding and acceptance.     Dental Advisory Given  Plan Discussed with: CRNA  Anesthesia Plan Comments:         Anesthesia Quick Evaluation

## 2020-11-17 NOTE — Op Note (Signed)
[facility] Patient Name: Brandon Lawrence Procedure Date: 11/17/2020 11:39 AM MRN: [id number] Date of Birth: 08-19-1960 Attending MD: Norvel Richards , MD CSN: [id number] Age: 60 Admit Type: Outpatient Procedure:                Upper GI endoscopy Indications:              Dysphagia Providers:                Norvel Richards, MD, Gwenlyn Fudge, RN,                            Kristine L. Risa Grill, Technician Referring MD:              Medicines:                Propofol per Anesthesia Complications:            No immediate complications. Estimated Blood Loss:     Estimated blood loss: none. Procedure:                Pre-Anesthesia Assessment:                           - Prior to the procedure, a History and Physical                            was performed, and patient medications and                            allergies were reviewed. The patient's tolerance of                            previous anesthesia was also reviewed. The risks                            and benefits of the procedure and the sedation                            options and risks were discussed with the patient.                            All questions were answered, and informed consent                            was obtained. Prior Anticoagulants: The patient has                            taken no previous anticoagulant or antiplatelet                            agents. ASA Grade Assessment: III - A patient with                            severe systemic disease. After reviewing the risks  and benefits, the patient was deemed in                            satisfactory condition to undergo the procedure.                           After obtaining informed consent, the endoscope was                            passed under direct vision. Throughout the                            procedure, the patient's blood pressure, pulse, and                            oxygen saturations  were monitored continuously. The                            [id number]-H190 ([id number]) scope was introduced through the                            mouth, and advanced to the second part of duodenum.                            The upper GI endoscopy was accomplished without                            difficulty. The patient tolerated the procedure                            well. Scope In: 47:42:59 PM Scope Out: 12:43:56 PM Total Procedure Duration: 0 hours 7 minutes 15 seconds  Findings:      The examined esophagus was normal.      A small hiatal hernia was present.      The exam was otherwise without abnormality.      The duodenal bulb and second portion of the duodenum were normal. The       scope was withdrawn. Dilation was performed with a Maloney dilator with       mild resistance at 56 Fr. The dilation site was examined following       endoscope reinsertion and showed no change. Estimated blood loss: none. Impression:               - Normal esophagus. Dilated.                           - Small hiatal hernia.                           - The examination was otherwise normal.                           - Normal duodenal bulb and second portion of the  duodenum.                           - No specimens collected. Moderate Sedation:      Moderate (conscious) sedation was personally administered by an       anesthesia professional. The following parameters were monitored: oxygen       saturation, heart rate, blood pressure, respiratory rate, EKG, adequacy       of pulmonary ventilation, and response to care. Recommendation:           - Patient has a contact number available for                            emergencies. The signs and symptoms of potential                            delayed complications were discussed with the                            patient. Return to normal activities tomorrow.                            Written discharge instructions were provided  to the                            patient.                           - Resume previous diet.                           - Continue present medications.                           - Return to my office in 6 weeks. Procedure Code(s):        --- Professional ---                           [phone], Esophagogastroduodenoscopy, flexible,                            transoral; diagnostic, including collection of                            specimen(s) by brushing or washing, when performed                            (separate procedure)                           43450, Dilation of esophagus, by unguided sound or                            bougie, single or multiple passes Diagnosis Code(s):        --- Professional ---  K44.9, Diaphragmatic hernia without obstruction or                            gangrene                           R13.10, Dysphagia, unspecified CPT copyright 2019 American Medical Association. All rights reserved. The codes documented in this report are preliminary and upon coder review may  be revised to meet current compliance requirements. Cristopher Estimable. Normand Damron, MD Norvel Richards, MD 11/17/2020 12:52:45 PM This report has been signed electronically. Number of Addenda: 0

## 2020-11-17 NOTE — Discharge Instructions (Addendum)
EGD Discharge instructions Please read the instructions outlined below and refer to this sheet in the next few weeks. These discharge instructions provide you with general information on caring for yourself after you leave the hospital. Your doctor may also give you specific instructions. While your treatment has been planned according to the most current medical practices available, unavoidable complications occasionally occur. If you have any problems or questions after discharge, please call your doctor. ACTIVITY You may resume your regular activity but move at a slower pace for the next 24 hours.  Take frequent rest periods for the next 24 hours.  Walking will help expel (get rid of) the air and reduce the bloated feeling in your abdomen.  No driving for 24 hours (because of the anesthesia (medicine) used during the test).  You may shower.  Do not sign any important legal documents or operate any machinery for 24 hours (because of the anesthesia used during the test).  NUTRITION Drink plenty of fluids.  You may resume your normal diet.  Begin with a light meal and progress to your normal diet.  Avoid alcoholic beverages for 24 hours or as instructed by your caregiver.  MEDICATIONS You may resume your normal medications unless your caregiver tells you otherwise.  WHAT YOU CAN EXPECT TODAY You may experience abdominal discomfort such as a feeling of fullness or "gas" pains.  FOLLOW-UP Your doctor will discuss the results of your test with you.  SEEK IMMEDIATE MEDICAL ATTENTION IF ANY OF THE FOLLOWING OCCUR: Excessive nausea (feeling sick to your stomach) and/or vomiting.  Severe abdominal pain and distention (swelling).  Trouble swallowing.  Temperature over 101 F (37.8 C).  Rectal bleeding or vomiting of blood.     Your esophagus was stretched today  Office visit with Aliene Altes in 6 weeks  At patient request, I called [facility] at [phone] results
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
## 2020-11-17 NOTE — Transfer of Care (Signed)
Immediate Anesthesia Transfer of Care Note  Patient: Brandon Lawrence  Procedure(s) Performed: ESOPHAGOGASTRODUODENOSCOPY (EGD) WITH PROPOFOL MALONEY DILATION  Patient Location: Short Stay  Anesthesia Type:General  Level of Consciousness: awake  Airway & Oxygen Therapy: Patient Spontanous Breathing  Post-op Assessment: Report given to RN and Post -op Vital signs reviewed and stable  Post vital signs: Reviewed and stable  Last Vitals:  Vitals Value Taken Time  BP    Temp    Pulse    Resp    SpO2      Last Pain:  Vitals:   11/17/20 1233  TempSrc:   PainSc: 0-No pain         Complications: No notable events documented.

## 2020-11-17 NOTE — Anesthesia Postprocedure Evaluation (Signed)
Anesthesia Post Note  Patient: Brandon Lawrence  Procedure(s) Performed: ESOPHAGOGASTRODUODENOSCOPY (EGD) WITH PROPOFOL [place]  Patient location during evaluation: Phase II Anesthesia Type: General Level of consciousness: awake Pain management: pain level controlled Vital Signs Assessment: post-procedure vital signs reviewed and stable Respiratory status: spontaneous breathing and respiratory function stable Cardiovascular status: blood pressure returned to baseline and stable Postop Assessment: no headache and no apparent nausea or vomiting Anesthetic complications: no Comments: Late entry   No notable events documented.   Last Vitals:  Vitals:   11/17/20 1114 11/17/20 1249  BP: 119/64 (!) 115/56  Pulse: (!) 49 69  Resp: (!) 22 (!) 23  Temp: 36.8 C 36.6 C  SpO2: 99% 99%    Last Pain:  Vitals:   11/17/20 1249  TempSrc: Oral  PainSc: 0-No pain                 Louann Sjogren

## 2020-11-24 ENCOUNTER — Encounter (HOSPITAL_COMMUNITY): Payer: Self-pay | Admitting: Internal Medicine

## 2020-11-27 ENCOUNTER — Encounter: Payer: Self-pay | Admitting: Neurology

## 2020-11-27 ENCOUNTER — Ambulatory Visit: Payer: Medicare Other | Admitting: Neurology

## 2020-11-27 ENCOUNTER — Other Ambulatory Visit: Payer: Self-pay

## 2020-11-27 VITALS — BP 128/74 | HR 67 | Ht 66.0 in | Wt 127.0 lb

## 2020-11-27 DIAGNOSIS — G4733 Obstructive sleep apnea (adult) (pediatric): Secondary | ICD-10-CM

## 2020-11-27 DIAGNOSIS — Z7722 Contact with and (suspected) exposure to environmental tobacco smoke (acute) (chronic): Secondary | ICD-10-CM | POA: Diagnosis not present

## 2020-11-27 DIAGNOSIS — K219 Gastro-esophageal reflux disease without esophagitis: Secondary | ICD-10-CM | POA: Diagnosis not present

## 2020-11-27 DIAGNOSIS — R519 Headache, unspecified: Secondary | ICD-10-CM

## 2020-11-27 DIAGNOSIS — G4739 Other sleep apnea: Secondary | ICD-10-CM | POA: Diagnosis not present

## 2020-11-27 DIAGNOSIS — Z9989 Dependence on other enabling machines and devices: Secondary | ICD-10-CM | POA: Diagnosis not present

## 2020-11-27 NOTE — Patient Instructions (Addendum)
CENTRAL SLEEP APNEA noted on CPAP download.   I will order an overnight pulse-oximetry while on CPAP, and then ( if indicated ) an in lab titration to CPAP.   Sleepiness has improved a bit, headaches have resolved since CPAP is in use.
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
## 2020-12-05 DIAGNOSIS — G4733 Obstructive sleep apnea (adult) (pediatric): Secondary | ICD-10-CM | POA: Diagnosis not present

## 2020-12-06 DIAGNOSIS — G4733 Obstructive sleep apnea (adult) (pediatric): Secondary | ICD-10-CM | POA: Diagnosis not present

## 2020-12-12 ENCOUNTER — Other Ambulatory Visit: Payer: Self-pay

## 2020-12-12 DIAGNOSIS — D508 Other iron deficiency anemias: Secondary | ICD-10-CM

## 2020-12-20 DIAGNOSIS — D508 Other iron deficiency anemias: Secondary | ICD-10-CM | POA: Diagnosis not present

## 2020-12-21 LAB — CBC WITH DIFFERENTIAL/PLATELET
Absolute Monocytes: 676 cells/uL (ref 200–950)
Basophils Absolute: 39 cells/uL (ref 0–200)
Basophils Relative: 0.6 %
Eosinophils Absolute: 416 cells/uL (ref 15–500)
Eosinophils Relative: 6.4 %
HCT: 34.5 % — ABNORMAL LOW (ref 38.5–50.0)
Hemoglobin: 11.7 g/dL — ABNORMAL LOW (ref 13.2–17.1)
Lymphs Abs: 3062 cells/uL (ref 850–3900)
MCH: 33.2 pg — ABNORMAL HIGH (ref 27.0–33.0)
MCHC: 33.9 g/dL (ref 32.0–36.0)
MCV: 98 fL (ref 80.0–100.0)
MPV: 10.7 fL (ref 7.5–12.5)
Monocytes Relative: 10.4 %
Neutro Abs: 2308 cells/uL (ref 1500–7800)
Neutrophils Relative %: 35.5 %
Platelets: 173 10*3/uL (ref 140–400)
RBC: 3.52 10*6/uL — ABNORMAL LOW (ref 4.20–5.80)
RDW: 13.2 % (ref 11.0–15.0)
Total Lymphocyte: 47.1 %
WBC: 6.5 10*3/uL (ref 3.8–10.8)

## 2020-12-21 LAB — IRON,TIBC AND FERRITIN PANEL
%SAT: 30 % (calc) (ref 20–48)
Ferritin: 125 ng/mL (ref 38–380)
Iron: 89 ug/dL (ref 50–180)
TIBC: 295 mcg/dL (calc) (ref 250–425)

## 2020-12-25 ENCOUNTER — Encounter: Payer: Self-pay | Admitting: Internal Medicine

## 2021-01-05 ENCOUNTER — Other Ambulatory Visit: Payer: Self-pay | Admitting: Family Medicine

## 2021-01-05 DIAGNOSIS — G4733 Obstructive sleep apnea (adult) (pediatric): Secondary | ICD-10-CM | POA: Diagnosis not present

## 2021-02-04 DIAGNOSIS — G4733 Obstructive sleep apnea (adult) (pediatric): Secondary | ICD-10-CM | POA: Diagnosis not present

## 2021-02-08 ENCOUNTER — Encounter: Payer: Self-pay | Admitting: Neurology

## 2021-02-08 DIAGNOSIS — G4730 Sleep apnea, unspecified: Secondary | ICD-10-CM | POA: Diagnosis not present

## 2021-02-08 DIAGNOSIS — R0683 Snoring: Secondary | ICD-10-CM | POA: Diagnosis not present

## 2021-02-15 ENCOUNTER — Telehealth: Payer: Self-pay | Admitting: Neurology

## 2021-02-15 NOTE — Telephone Encounter (Signed)
Received the report from the DME company where the ONO was completed while the patient wore his CPAP.  There was a total recording of 7 hours and 42 min and 44 sec. Out of that time the oxygen level stayed at 90% or higher while wearing CPAP. Will provide this information for review to Dr Brett Fairy and let the patient know this information.   **I did call the pt and inform him of the report. Pt verbalized understanding. Pt had no questions at this time but was encouraged to call back if questions arise.

## 2021-02-23 DIAGNOSIS — H31091 Other chorioretinal scars, right eye: Secondary | ICD-10-CM | POA: Diagnosis not present

## 2021-02-23 DIAGNOSIS — H31011 Macula scars of posterior pole (postinflammatory) (post-traumatic), right eye: Secondary | ICD-10-CM | POA: Diagnosis not present

## 2021-02-23 DIAGNOSIS — H35361 Drusen (degenerative) of macula, right eye: Secondary | ICD-10-CM | POA: Diagnosis not present

## 2021-02-23 DIAGNOSIS — H524 Presbyopia: Secondary | ICD-10-CM | POA: Diagnosis not present

## 2021-02-23 DIAGNOSIS — H2513 Age-related nuclear cataract, bilateral: Secondary | ICD-10-CM | POA: Diagnosis not present

## 2021-03-07 DIAGNOSIS — G4733 Obstructive sleep apnea (adult) (pediatric): Secondary | ICD-10-CM | POA: Diagnosis not present

## 2021-03-13 DIAGNOSIS — G4733 Obstructive sleep apnea (adult) (pediatric): Secondary | ICD-10-CM | POA: Diagnosis not present

## 2021-04-02 ENCOUNTER — Other Ambulatory Visit: Payer: Self-pay

## 2021-04-02 ENCOUNTER — Telehealth (INDEPENDENT_AMBULATORY_CARE_PROVIDER_SITE_OTHER): Payer: Medicare Other | Admitting: Nurse Practitioner

## 2021-04-02 ENCOUNTER — Encounter: Payer: Self-pay | Admitting: Nurse Practitioner

## 2021-04-02 DIAGNOSIS — E441 Mild protein-calorie malnutrition: Secondary | ICD-10-CM

## 2021-04-02 DIAGNOSIS — N400 Enlarged prostate without lower urinary tract symptoms: Secondary | ICD-10-CM

## 2021-04-02 DIAGNOSIS — I10 Essential (primary) hypertension: Secondary | ICD-10-CM

## 2021-04-02 DIAGNOSIS — K219 Gastro-esophageal reflux disease without esophagitis: Secondary | ICD-10-CM

## 2021-04-02 DIAGNOSIS — G8929 Other chronic pain: Secondary | ICD-10-CM | POA: Diagnosis not present

## 2021-04-02 DIAGNOSIS — M5442 Lumbago with sciatica, left side: Secondary | ICD-10-CM | POA: Diagnosis not present

## 2021-04-02 DIAGNOSIS — M5441 Lumbago with sciatica, right side: Secondary | ICD-10-CM | POA: Diagnosis not present

## 2021-04-02 MED ORDER — MIRTAZAPINE 7.5 MG PO TABS: 7.5000 mg | ORAL_TABLET | Freq: Every day | ORAL | 1 refills | Status: DC

## 2021-04-02 MED ORDER — DOXAZOSIN MESYLATE 4 MG PO TABS: 4.0000 mg | ORAL_TABLET | Freq: Every day | ORAL | 1 refills | Status: DC

## 2021-04-02 MED ORDER — DULOXETINE HCL 30 MG PO CPEP: 30.0000 mg | ORAL_CAPSULE | Freq: Every day | ORAL | 1 refills | Status: DC

## 2021-04-02 MED ORDER — TIZANIDINE HCL 4 MG PO TABS: 4.0000 mg | ORAL_TABLET | Freq: Four times a day (QID) | ORAL | 0 refills | Status: DC | PRN

## 2021-04-02 NOTE — Assessment & Plan Note (Signed)
Chronic.  It sounds like symptoms are not well controlled based on what the patient is on me today.  I have encouraged him to reach out to his gastroenterologist to schedule a follow-up visit to discuss options and treatment going forward.

## 2021-04-02 NOTE — Assessment & Plan Note (Signed)
Chronic.  He has run out of the mirtazapine-we will refill today.  Suggested to take this every day to help with appetite.  Follow-up 4 months.

## 2021-04-02 NOTE — Assessment & Plan Note (Signed)
Chronic.  Refill given for tizanidine.  We need to stop Celebrex secondary to significant acid reflux/burning.  Referral placed to pain management per patient request today.

## 2021-04-02 NOTE — Assessment & Plan Note (Signed)
Stable with doxazosin.  Continue this medication.

## 2021-04-02 NOTE — Assessment & Plan Note (Signed)
Chronic.  Blood pressure unable to be checked today-patient does not have pressure cuff at home.  Recent blood pressure at specialty office suggests good control of blood pressure.  We will continue doxazosin 4 mg daily for now.  Check CBC and electrolytes with kidney function when patient is able to stop by our clinic for blood work.  Follow-up 4 months.
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
## 2021-04-06 DIAGNOSIS — G4733 Obstructive sleep apnea (adult) (pediatric): Secondary | ICD-10-CM | POA: Diagnosis not present

## 2021-04-11 ENCOUNTER — Other Ambulatory Visit: Payer: Self-pay | Admitting: Gastroenterology

## 2021-04-11 ENCOUNTER — Other Ambulatory Visit: Payer: Self-pay | Admitting: Family Medicine

## 2021-04-11 DIAGNOSIS — R112 Nausea with vomiting, unspecified: Secondary | ICD-10-CM

## 2021-04-12 ENCOUNTER — Other Ambulatory Visit: Payer: Self-pay

## 2021-04-12 ENCOUNTER — Other Ambulatory Visit: Payer: Medicare Other

## 2021-04-12 DIAGNOSIS — K219 Gastro-esophageal reflux disease without esophagitis: Secondary | ICD-10-CM | POA: Diagnosis not present

## 2021-04-12 DIAGNOSIS — E441 Mild protein-calorie malnutrition: Secondary | ICD-10-CM

## 2021-04-12 DIAGNOSIS — I10 Essential (primary) hypertension: Secondary | ICD-10-CM

## 2021-04-13 LAB — COMPLETE METABOLIC PANEL WITH GFR
AG Ratio: 1.5 (calc) (ref 1.0–2.5)
ALT: 19 U/L (ref 9–46)
AST: 21 U/L (ref 10–35)
Albumin: 4.1 g/dL (ref 3.6–5.1)
Alkaline phosphatase (APISO): 37 U/L (ref 35–144)
BUN/Creatinine Ratio: 28 (calc) — ABNORMAL HIGH (ref 6–22)
BUN: 30 mg/dL — ABNORMAL HIGH (ref 7–25)
CO2: 29 mmol/L (ref 20–32)
Calcium: 9.4 mg/dL (ref 8.6–10.3)
Chloride: 106 mmol/L (ref 98–110)
Creat: 1.07 mg/dL (ref 0.70–1.30)
Globulin: 2.8 g/dL (calc) (ref 1.9–3.7)
Glucose, Bld: 103 mg/dL — ABNORMAL HIGH (ref 65–99)
Potassium: 4.5 mmol/L (ref 3.5–5.3)
Sodium: 140 mmol/L (ref 135–146)
Total Bilirubin: 0.4 mg/dL (ref 0.2–1.2)
Total Protein: 6.9 g/dL (ref 6.1–8.1)
eGFR: 80 mL/min/{1.73_m2} (ref >=60)

## 2021-04-13 LAB — CBC WITH DIFFERENTIAL/PLATELET
Absolute Monocytes: 869 cells/uL (ref 200–950)
Basophils Absolute: 63 cells/uL (ref 0–200)
Basophils Relative: 0.8 %
Eosinophils Absolute: 458 cells/uL (ref 15–500)
Eosinophils Relative: 5.8 %
HCT: 34.5 % — ABNORMAL LOW (ref 38.5–50.0)
Hemoglobin: 11.9 g/dL — ABNORMAL LOW (ref 13.2–17.1)
Lymphs Abs: 3389 cells/uL (ref 850–3900)
MCH: 33.8 pg — ABNORMAL HIGH (ref 27.0–33.0)
MCHC: 34.5 g/dL (ref 32.0–36.0)
MCV: 98 fL (ref 80.0–100.0)
MPV: 10.7 fL (ref 7.5–12.5)
Monocytes Relative: 11 %
Neutro Abs: 3121 cells/uL (ref 1500–7800)
Neutrophils Relative %: 39.5 %
Platelets: 187 10*3/uL (ref 140–400)
RBC: 3.52 10*6/uL — ABNORMAL LOW (ref 4.20–5.80)
RDW: 13.1 % (ref 11.0–15.0)
Total Lymphocyte: 42.9 %
WBC: 7.9 10*3/uL (ref 3.8–10.8)

## 2021-05-07 DIAGNOSIS — G4733 Obstructive sleep apnea (adult) (pediatric): Secondary | ICD-10-CM | POA: Diagnosis not present
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
## 2021-05-09 ENCOUNTER — Ambulatory Visit: Payer: Medicare Other | Admitting: Gastroenterology

## 2021-05-09 ENCOUNTER — Encounter: Payer: Self-pay | Admitting: Internal Medicine

## 2021-05-22 ENCOUNTER — Encounter: Payer: Self-pay | Admitting: Internal Medicine

## 2021-05-22 ENCOUNTER — Other Ambulatory Visit: Payer: Self-pay

## 2021-05-22 ENCOUNTER — Ambulatory Visit: Payer: Medicare Other | Admitting: Internal Medicine

## 2021-05-22 VITALS — BP 131/63 | HR 51 | Temp 97.5°F | Ht 66.0 in | Wt 135.6 lb

## 2021-05-22 DIAGNOSIS — K5909 Other constipation: Secondary | ICD-10-CM

## 2021-05-22 DIAGNOSIS — R1310 Dysphagia, unspecified: Secondary | ICD-10-CM

## 2021-05-22 DIAGNOSIS — R1013 Epigastric pain: Secondary | ICD-10-CM | POA: Diagnosis not present

## 2021-05-22 DIAGNOSIS — K219 Gastro-esophageal reflux disease without esophagitis: Secondary | ICD-10-CM

## 2021-05-22 MED ORDER — SUCRALFATE 1 G PO TABS: 1.0000 g | ORAL_TABLET | Freq: Every day | ORAL | 11 refills | Status: DC
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
## 2021-05-22 NOTE — Patient Instructions (Signed)
It was good to see you again today!  Continue Linzess and Dexilant without change  Continue famotidine 20 mg at bedtime  Carafate 1 g slurry orally at bedtime (dispense 30 with 11 refills)  Limited use of NSAIDs is much as possible  Plan for repeat colonoscopy 2027  Office visit here with Aliene Altes in 4 months

## 2021-06-07 DIAGNOSIS — G4733 Obstructive sleep apnea (adult) (pediatric): Secondary | ICD-10-CM | POA: Diagnosis not present

## 2021-06-18 ENCOUNTER — Other Ambulatory Visit: Payer: Self-pay

## 2021-06-18 ENCOUNTER — Ambulatory Visit: Payer: Medicare Other | Admitting: Urology

## 2021-06-18 VITALS — BP 162/62 | HR 56

## 2021-06-18 DIAGNOSIS — R351 Nocturia: Secondary | ICD-10-CM | POA: Diagnosis not present

## 2021-06-18 DIAGNOSIS — C649 Malignant neoplasm of unspecified kidney, except renal pelvis: Secondary | ICD-10-CM | POA: Diagnosis not present

## 2021-06-18 LAB — MICROSCOPIC EXAMINATION
Bacteria, UA: NONE SEEN
Epithelial Cells (non renal): NONE SEEN /hpf (ref 0–10)
RBC, Urine: NONE SEEN /hpf (ref 0–2)
Renal Epithel, UA: NONE SEEN /hpf
WBC, UA: NONE SEEN /hpf (ref 0–5)

## 2021-06-18 LAB — URINALYSIS, ROUTINE W REFLEX MICROSCOPIC
Bilirubin, UA: NEGATIVE
Glucose, UA: NEGATIVE
Ketones, UA: NEGATIVE
Leukocytes,UA: NEGATIVE
Nitrite, UA: NEGATIVE
RBC, UA: NEGATIVE
Specific Gravity, UA: >1.03 — ABNORMAL HIGH (ref 1.005–1.030)
Urobilinogen, Ur: 0.2 mg/dL (ref 0.2–1.0)
pH, UA: 6 (ref 5.0–7.5)
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
## 2021-06-25 ENCOUNTER — Encounter: Payer: Self-pay | Admitting: Urology

## 2021-06-25 NOTE — Patient Instructions (Signed)
Kidney Cancer ?Kidney cancer is an abnormal growth of cells in one or both kidneys. The kidneys filter waste from your blood and produce urine. Kidney cancer may spread to other parts of your body. This type of cancer may also be called renal cell carcinoma. ?What are the causes? ?The cause of this condition is not always known. In some cases, abnormal changes to genes (genetic mutations) can cause cells to form cancer. ?What increases the risk? ?You may be more likely to develop kidney cancer if you: ?Are over age 60. The risk increases with age. ?Have a family history of kidney cancer. ?Are of African-American, Native American, or Native [place] descent. ?Smoke. ?Are male. ?Are obese. ?Have high blood pressure (hypertension). ?Have advanced kidney disease, especially if you need long-term dialysis. ?Have certain conditions that are passed from parent to child (inherited), such as von Hippel-Lindau disease, tuberous sclerosis, or hereditary papillary renal carcinoma. ?Have been exposed to certain chemicals. ?What are the signs or symptoms? ?In the early stages, kidney cancer does not cause symptoms. As the cancer grows, symptoms may include: ?Blood in the urine. ?Pain in the upper back or abdomen, just below the rib cage. You may feel pain on one or both sides of the body. ?Fatigue. ?Unexplained weight loss. ?Fever. ?How is this diagnosed? ?This condition may be diagnosed based on: ?Your symptoms and medical history. ?A physical exam. ?Blood and urine tests. ?X-rays. ?Imaging tests, such as CT scans, MRIs, and PET scans. ?Having dye injected into your blood through an IV, and then having X-rays taken of: ?Your kidneys and the rest of the organs involved in making and storing urine (intravenous pyelogram). ?Your blood vessels (angiogram). ?Removal and testing of a kidney tissue sample (biopsy). ?Your cancer will be assessed (staged), based on how severe it is and how much it has spread. ?How is this  treated? ?Treatment depends on the type and stage of the cancer. Treatment may include one or more of the following: ?Surgery. This may include surgery to remove: ?Just the tumor (nephron-sparing surgery). ?The entire kidney (nephrectomy). ?The kidney, some of the surrounding healthy tissue, nearby lymph nodes, and the adrenal gland in certain cases (radical nephrectomy). ?Medicines that kill cancer cells (chemotherapy). ?High-energy rays that kill cancer cells (radiation therapy). ?Targeted therapy. This targets specific parts of cancer cells and the area around them to block the growth and the spread of the cancer. Targeted therapy can help to limit the damage to healthy cells. ?Medicines that help your body's disease-fighting system (immune system) fight cancer cells (immunotherapy). ?Freezing cancer cells using gas or liquid that is delivered through a needle (cryoablation). ?Destroying cancer cells using high-energy radio waves that are delivered through a needle-like probe (radiofrequency ablation). ?A procedure to block the artery that supplies blood to the tumor, which kills the cancer cells (embolization). ?Follow these instructions at home: ?Eating and drinking ?Some of your treatments might affect your appetite and your ability to chew and swallow. If you are having problems eating, or if you do not have an appetite, meet with a diet and nutrition specialist (dietitian). ?If you have side effects that affect eating, it may help to: ?Eat smaller meals and snacks often. ?Drink high-nutrition and high-calorie shakes or supplements. ?Eat bland and soft foods that are easy to eat. ?Not eat foods that are hot, spicy, or hard to swallow. ?Lifestyle ?Do not drink alcohol. ?Do not use any products that contain nicotine or tobacco, such as cigarettes and e-cigarettes. If you need help   quitting, ask your health care provider. ?General instructions ? ?Take over-the-counter and prescription medicines only as told by  your health care provider. This includes vitamins, supplements, and herbal products. ?Consider joining a support group to help you cope with the stress of having kidney cancer. ?Work with your health care provider to manage any side effects of treatment. ?Keep all follow-up visits as told by your health care provider. This is important. ?Where to find more information ?American Cancer Society: https://www.cancer.org ?National Cancer Institute (NCI): https://www.cancer.gov ?Contact a health care provider if you: ?Notice that you bruise or bleed easily. ?Are losing weight without trying. ?Have new or increased fatigue or weakness. ?Get help right away if you have: ?Blood in your urine. ?A sudden increase in pain. ?A fever. ?Shortness of breath. ?Chest pain. ?Yellow skin or whites of your eyes (jaundice). ?Summary ?Kidney cancer is an abnormal growth of cells (tumor) in one or both kidneys. Tumors may spread to other parts of your body. ?In the early stages, kidney cancer does not cause symptoms. As the cancer grows, symptoms may include blood in the urine, pain in the upper back or abdomen, unexplained weight loss, fatigue, and fever. ?Treatment depends on the type and stage of the cancer. It may include surgery to remove the tumor, procedures and medicines to kill the cancer cells, or medicines to help your body fight cancer cells. ?This information is not intended to replace advice given to you by your health care provider. Make sure you discuss any questions you have with your health care provider. ?Document Revised: 12/12/2020 Document Reviewed: 12/12/2020 ?Elsevier Patient Education ? 2022 Elsevier Inc. ? ?

## 2021-06-27 ENCOUNTER — Telehealth: Payer: Self-pay | Admitting: Family Medicine

## 2021-06-27 DIAGNOSIS — K5900 Constipation, unspecified: Secondary | ICD-10-CM

## 2021-06-27 DIAGNOSIS — E441 Mild protein-calorie malnutrition: Secondary | ICD-10-CM

## 2021-06-27 DIAGNOSIS — F330 Major depressive disorder, recurrent, mild: Secondary | ICD-10-CM

## 2021-06-27 MED ORDER — MIRTAZAPINE 7.5 MG PO TABS: 7.5000 mg | ORAL_TABLET | Freq: Every day | ORAL | 1 refills | Status: DC

## 2021-06-27 MED ORDER — DICLOFENAC SODIUM 1 % EX GEL: 1.0000 "application " | Freq: Three times a day (TID) | CUTANEOUS | 1 refills | Status: DC

## 2021-06-27 MED ORDER — DEXLANSOPRAZOLE 60 MG PO CPDR: DELAYED_RELEASE_CAPSULE | ORAL | 1 refills | Status: DC

## 2021-06-27 MED ORDER — LINACLOTIDE 72 MCG PO CAPS: ORAL_CAPSULE | ORAL | 1 refills | Status: DC

## 2021-06-27 MED ORDER — CELECOXIB 100 MG PO CAPS: 100.0000 mg | ORAL_CAPSULE | Freq: Every day | ORAL | 1 refills | Status: DC

## 2021-06-27 NOTE — Telephone Encounter (Signed)
Patient called to request refill of   tiZANidine (ZANAFLEX) 4 MG tablet [[id number]]   famotidine (PEPCID) 20 MG tablet [[id number]]   mirtazapine (REMERON) 15 MG tablet [[id number]]   DULoxetine (CYMBALTA) 30 MG capsule [[id number]]   doxazosin (CARDURA) 4 MG tablet [[id number]]   sucralfate (CARAFATE) 1 g tablet [[id number]]   celecoxib (CELEBREX) 100 MG capsule [[id number]]   dexlansoprazole (DEXILANT) 60 MG capsule [[id number]]  linaclotide (LINZESS) 72 MCG capsule [[id number]]   diclofenac Sodium (VOLTAREN) 1 % GEL [[id number]]     Pharmacy confirmed as   [facility] [place] [facility] [address]  [address], [place] [address]  Phone:  [phone]  Fax:  [phone]   Please advise at [phone].

## 2021-06-27 NOTE — Telephone Encounter (Signed)
Per chart pt is not due for refills on all of the requested medications, and some we do not prescribe.   Refills that are due were sent to the pharmacy.   [facility] to advise patient

## 2021-06-27 NOTE — Telephone Encounter (Signed)
°  tiZANidine (ZANAFLEX) 4 MG tablet [[id number]]    Not available for refill at this time famotidine (PEPCID) 20 MG tablet [[id number]]    We did not prescribe this medication mirtazapine (REMERON) 15 MG tablet [[id number]]    Refill sent to pharmacy DULoxetine (CYMBALTA) 30 MG capsule [[id number]]    Not due for refill at this time doxazosin (CARDURA) 4 MG tablet [[id number]]    Not due for refill at this time sucralfate (CARAFATE) 1 g tablet [[id number]]    We do not prescribe this medication; not due for refill at this time celecoxib (CELEBREX) 100 MG capsule [[id number]]    Refill sent to pharmacy dexlansoprazole (DEXILANT) 60 MG capsule [[id number]]   Refill sent to pharmacy linaclotide Rolan Lipa) 72 MCG capsule [[id number]]    Refill sent to pharmacy diclofenac Sodium (VOLTAREN) 1 % GEL [[id number]]    Refill sent to pharmacy

## 2021-06-28 ENCOUNTER — Other Ambulatory Visit: Payer: Self-pay

## 2021-06-28 ENCOUNTER — Encounter: Payer: Self-pay | Admitting: Family Medicine

## 2021-06-28 ENCOUNTER — Ambulatory Visit (INDEPENDENT_AMBULATORY_CARE_PROVIDER_SITE_OTHER): Payer: Medicare Other | Admitting: Family Medicine

## 2021-06-28 VITALS — BP 168/78 | HR 70 | Temp 97.5°F | Resp 18 | Ht 66.0 in | Wt 130.0 lb

## 2021-06-28 DIAGNOSIS — R079 Chest pain, unspecified: Secondary | ICD-10-CM | POA: Diagnosis not present

## 2021-06-28 MED ORDER — VALSARTAN 160 MG PO TABS: 160.0000 mg | ORAL_TABLET | Freq: Every day | ORAL | 3 refills | Status: DC
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
## 2021-07-15 ENCOUNTER — Emergency Department (HOSPITAL_BASED_OUTPATIENT_CLINIC_OR_DEPARTMENT_OTHER)
Admission: EM | Admit: 2021-07-15 | Discharge: 2021-07-16 | Disposition: A | Discharge status: Home / Self Care | Payer: Medicare Other | Source: Home / Self Care | Attending: Emergency Medicine | Admitting: Emergency Medicine

## 2021-07-15 ENCOUNTER — Emergency Department (HOSPITAL_BASED_OUTPATIENT_CLINIC_OR_DEPARTMENT_OTHER): Payer: Medicare Other

## 2021-07-15 ENCOUNTER — Encounter (HOSPITAL_COMMUNITY): Payer: Self-pay | Admitting: Emergency Medicine

## 2021-07-15 ENCOUNTER — Emergency Department (HOSPITAL_COMMUNITY)
Admission: EM | Admit: 2021-07-15 | Discharge: 2021-07-15 | Disposition: A | Discharge status: Home / Self Care | Payer: Medicare Other | Attending: Emergency Medicine | Admitting: Emergency Medicine

## 2021-07-15 ENCOUNTER — Encounter (HOSPITAL_BASED_OUTPATIENT_CLINIC_OR_DEPARTMENT_OTHER): Payer: Self-pay | Admitting: *Deleted

## 2021-07-15 ENCOUNTER — Other Ambulatory Visit: Payer: Self-pay

## 2021-07-15 DIAGNOSIS — Z87891 Personal history of nicotine dependence: Secondary | ICD-10-CM | POA: Insufficient documentation

## 2021-07-15 DIAGNOSIS — Z85528 Personal history of other malignant neoplasm of kidney: Secondary | ICD-10-CM | POA: Insufficient documentation

## 2021-07-15 DIAGNOSIS — I10 Essential (primary) hypertension: Secondary | ICD-10-CM | POA: Insufficient documentation

## 2021-07-15 DIAGNOSIS — R0789 Other chest pain: Secondary | ICD-10-CM | POA: Diagnosis not present

## 2021-07-15 DIAGNOSIS — G8929 Other chronic pain: Secondary | ICD-10-CM

## 2021-07-15 DIAGNOSIS — Z79899 Other long term (current) drug therapy: Secondary | ICD-10-CM | POA: Insufficient documentation

## 2021-07-15 DIAGNOSIS — R519 Headache, unspecified: Secondary | ICD-10-CM | POA: Diagnosis not present

## 2021-07-15 LAB — BASIC METABOLIC PANEL
Anion gap: 10 (ref 5–15)
BUN: 25 mg/dL — ABNORMAL HIGH (ref 6–20)
CO2: 24 mmol/L (ref 22–32)
Calcium: 9.5 mg/dL (ref 8.9–10.3)
Chloride: 103 mmol/L (ref 98–111)
Creatinine, Ser: 1.47 mg/dL — ABNORMAL HIGH (ref 0.61–1.24)
GFR, Estimated: 55 mL/min — ABNORMAL LOW (ref >60)
Glucose, Bld: 106 mg/dL — ABNORMAL HIGH (ref 70–99)
Potassium: 4.2 mmol/L (ref 3.5–5.1)
Sodium: 137 mmol/L (ref 135–145)

## 2021-07-15 LAB — TROPONIN I (HIGH SENSITIVITY): Troponin I (High Sensitivity): 11 ng/L (ref <18)

## 2021-07-15 LAB — CBC
HCT: 35.3 % — ABNORMAL LOW (ref 39.0–52.0)
Hemoglobin: 12.4 g/dL — ABNORMAL LOW (ref 13.0–17.0)
MCH: 33.1 pg (ref 26.0–34.0)
MCHC: 35.1 g/dL (ref 30.0–36.0)
MCV: 94.1 fL (ref 80.0–100.0)
Platelets: 182 10*3/uL (ref 150–400)
RBC: 3.75 MIL/uL — ABNORMAL LOW (ref 4.22–5.81)
RDW: 12.8 % (ref 11.5–15.5)
WBC: 8.9 10*3/uL (ref 4.0–10.5)
nRBC: 0 % (ref 0.0–0.2)

## 2021-07-15 MED ORDER — PROCHLORPERAZINE EDISYLATE 10 MG/2ML IJ SOLN
10.0000 mg | Freq: Once | INTRAMUSCULAR | Status: AC
  Administered 2021-07-15: 10 mg via INTRAVENOUS
  Filled 2021-07-15: qty 2

## 2021-07-15 MED ORDER — DIPHENHYDRAMINE HCL 50 MG/ML IJ SOLN
25.0000 mg | Freq: Once | INTRAMUSCULAR | Status: AC
  Administered 2021-07-15: 25 mg via INTRAVENOUS
  Filled 2021-07-15: qty 1
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
## 2021-07-15 NOTE — ED Triage Notes (Addendum)
C/o right sided headache that started yesterday. Hx of migraine in the past. States he has taken about 8 tylenol (states '325mg'$ )  in the past 8 hours per pt. States hx of HTN. States he has seen his MD recently for HTN and his meds were changed. C/o seeing  2 dots" to his right eye for the past couple months. No obvious neuro deficits noted. Pt states he was at [facility] tonight but left due to wait. States he was having cp and dizziness while there.  ?

## 2021-07-15 NOTE — ED Triage Notes (Signed)
Pt to the ED with complaints of headache for the past couple of months. ? ?The patient's fiance states he takes tylenol and it only works for 15 minutes. ? ?Pt has a history of HTN and is on medication. Patient started taking BP meds 2 weeks ago. ?

## 2021-07-15 NOTE — ED Notes (Signed)
Pt walked out of department saying "im leaving, im not waiting any longer" RN asked pt to sign out AMA, pt refused.  ?

## 2021-07-16 LAB — TROPONIN I (HIGH SENSITIVITY): Troponin I (High Sensitivity): 11 ng/L (ref <18)

## 2021-07-16 MED ORDER — AMLODIPINE BESYLATE 5 MG PO TABS: 5.0000 mg | ORAL_TABLET | Freq: Every day | ORAL | 0 refills | Status: DC

## 2021-07-16 MED ORDER — AMLODIPINE BESYLATE 5 MG PO TABS
5.0000 mg | ORAL_TABLET | Freq: Once | ORAL | Status: AC
  Administered 2021-07-16: 5 mg via ORAL
  Filled 2021-07-16: qty 1
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
## 2021-07-16 NOTE — Discharge Instructions (Signed)
You were seen today for headache, hypertension, chest pain.  Your work-up today is largely reassuring.  You will be started on an additional blood pressure medication.  Take as prescribed and follow-up with your primary doctor for recheck.  Additionally you need to follow-up with cardiology and neurology given your risk factors.  If you have any new or worsening symptoms, you should be reevaluated immediately. ?

## 2021-07-17 ENCOUNTER — Encounter: Payer: Self-pay | Admitting: Family Medicine

## 2021-07-17 ENCOUNTER — Ambulatory Visit (INDEPENDENT_AMBULATORY_CARE_PROVIDER_SITE_OTHER): Payer: Medicare Other | Admitting: Family Medicine

## 2021-07-17 ENCOUNTER — Other Ambulatory Visit: Payer: Self-pay

## 2021-07-17 ENCOUNTER — Telehealth: Payer: Self-pay | Admitting: Family Medicine

## 2021-07-17 VITALS — BP 142/68 | HR 87 | Temp 97.3°F | Resp 18 | Ht 65.0 in | Wt 130.0 lb

## 2021-07-17 DIAGNOSIS — I10 Essential (primary) hypertension: Secondary | ICD-10-CM

## 2021-07-17 NOTE — Telephone Encounter (Signed)
Patient requesting a refill of ? ?dexlansoprazole (DEXILANT) 60 MG capsule ? ? ?Only has two pills left.  ? ?Pharmacy confirmed as ? ?[facility] [address] [place], [place] [address]  ?Phone:  [phone]  Fax:  [phone]  ? ?Please advise at [phone]  ?

## 2021-07-17 NOTE — Telephone Encounter (Signed)
Patient also wants to know if he's supposed to take doxazosin (CARDURA) 4 MG tablet ? in addition. ? ?Please advise at [phone].  ?
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
## 2021-07-17 NOTE — Telephone Encounter (Signed)
Spoke with patient and advised rx's have already been sent to pharmacy.  ?

## 2021-07-18 ENCOUNTER — Encounter: Payer: Self-pay | Admitting: Internal Medicine

## 2021-07-18 ENCOUNTER — Ambulatory Visit (INDEPENDENT_AMBULATORY_CARE_PROVIDER_SITE_OTHER): Payer: Medicare Other | Admitting: Internal Medicine

## 2021-07-18 VITALS — BP 150/68 | HR 72 | Ht 65.0 in | Wt 130.0 lb

## 2021-07-18 DIAGNOSIS — T391X1A Poisoning by 4-Aminophenol derivatives, accidental (unintentional), initial encounter: Secondary | ICD-10-CM | POA: Diagnosis not present

## 2021-07-18 DIAGNOSIS — G4733 Obstructive sleep apnea (adult) (pediatric): Secondary | ICD-10-CM | POA: Diagnosis not present

## 2021-07-18 DIAGNOSIS — R079 Chest pain, unspecified: Secondary | ICD-10-CM | POA: Diagnosis not present

## 2021-07-18 DIAGNOSIS — I161 Hypertensive emergency: Secondary | ICD-10-CM | POA: Insufficient documentation

## 2021-07-18 MED ORDER — AMLODIPINE BESYLATE 10 MG PO TABS: 10.0000 mg | ORAL_TABLET | Freq: Every day | ORAL | 3 refills | Status: DC

## 2021-07-18 NOTE — Patient Instructions (Signed)
Medication Instructions:  ?Your physician has recommended you make the following change in your medication:  ?INCREASE: amlodipine (Norvasc) to 10 mg by mouth once daily ? ?*If you need a refill on your cardiac medications before your next appointment, please call your pharmacy* ? ? ?Lab Work: ?TODAY: CMP ?If you have labs (blood work) drawn today and your tests are completely normal, you will receive your results only by: ?MyChart Message (if you have MyChart) OR ?A paper copy in the mail ?If you have any lab test that is abnormal or we need to change your treatment, we will call you to review the results. ? ? ?Testing/Procedures: ?Your physician has requested that you have en exercise stress myoview in [place]. For further information please visit [email or web address]. Please follow instructions below.  ? ?You are scheduled for a Myocardial Perfusion Imaging Study. ?Please arrive 15 minutes prior to your appointment time for registration and insurance purposes. ?  ?The test will take approximately 3 to 4 hours to complete; you may bring reading material.  If someone comes with you to your appointment, they will need to remain in the main lobby due to limited space in the testing area.  ?  ?How to prepare for your Myocardial Perfusion Test: ?Do not eat or drink 3 hours prior to your test, except you may have water. ?Do not consume products containing caffeine (regular or decaffeinated) 12 hours prior to your test. (ex: coffee, chocolate, sodas, tea). ?Do bring a list of your current medications with you.  If not listed below, you may take your medications as normal. ?Do wear comfortable clothes (no dresses or overalls) and walking shoes, tennis shoes preferred (No heels or open toe shoes are allowed). ?Do NOT wear cologne, perfume, aftershave, or lotions (deodorant is allowed). ?If these instructions are not followed, your test will have to be rescheduled. ? ?If you cannot keep your appointment, please provide 24  hours notification to the Nuclear Lab, to avoid a possible $50 charge to your account.  ?   ? ? ?Follow-Up: ?At [facility], you and your health needs are our priority.  As part of our continuing mission to provide you with exceptional heart care, we have created designated Provider Care Teams.  These Care Teams include your primary Cardiologist (physician) and Advanced Practice Providers (APPs -  Physician Assistants and Nurse Practitioners) who all work together to provide you with the care you need, when you need it. ?  ? ?Your next appointment:   ?4 -44months) ? ?The format for your next appointment:   ?In Person ? ?Provider:   ?MRudean Haskell MD    ?

## 2021-07-19 LAB — COMPREHENSIVE METABOLIC PANEL
ALT: 21 IU/L (ref 0–44)
AST: 23 IU/L (ref 0–40)
Albumin/Globulin Ratio: 1.6 (ref 1.2–2.2)
Albumin: 4.5 g/dL (ref 3.8–4.9)
Alkaline Phosphatase: 49 IU/L (ref 44–121)
BUN/Creatinine Ratio: 16 (ref 9–20)
BUN: 27 mg/dL — ABNORMAL HIGH (ref 6–24)
Bilirubin Total: 0.2 mg/dL (ref 0.0–1.2)
CO2: 26 mmol/L (ref 20–29)
Calcium: 9.9 mg/dL (ref 8.7–10.2)
Chloride: 99 mmol/L (ref 96–106)
Creatinine, Ser: 1.67 mg/dL — ABNORMAL HIGH (ref 0.76–1.27)
Globulin, Total: 2.8 g/dL (ref 1.5–4.5)
Glucose: 97 mg/dL (ref 70–99)
Potassium: 4.8 mmol/L (ref 3.5–5.2)
Sodium: 137 mmol/L (ref 134–144)
Total Protein: 7.3 g/dL (ref 6.0–8.5)
eGFR: 47 mL/min/{1.73_m2} — ABNORMAL LOW (ref >59)

## 2021-07-22 ENCOUNTER — Emergency Department (HOSPITAL_COMMUNITY): Payer: Medicare Other

## 2021-07-22 ENCOUNTER — Emergency Department (HOSPITAL_COMMUNITY)
Admission: EM | Admit: 2021-07-22 | Discharge: 2021-07-22 | Disposition: A | Discharge status: Home / Self Care | Payer: Medicare Other | Attending: Emergency Medicine | Admitting: Emergency Medicine

## 2021-07-22 ENCOUNTER — Encounter (HOSPITAL_COMMUNITY): Payer: Self-pay | Admitting: Emergency Medicine

## 2021-07-22 DIAGNOSIS — R55 Syncope and collapse: Secondary | ICD-10-CM | POA: Diagnosis not present

## 2021-07-22 DIAGNOSIS — R42 Dizziness and giddiness: Secondary | ICD-10-CM | POA: Diagnosis present

## 2021-07-22 DIAGNOSIS — U071 COVID-19: Secondary | ICD-10-CM | POA: Diagnosis not present

## 2021-07-22 DIAGNOSIS — Z79899 Other long term (current) drug therapy: Secondary | ICD-10-CM | POA: Insufficient documentation

## 2021-07-22 DIAGNOSIS — R0789 Other chest pain: Secondary | ICD-10-CM | POA: Diagnosis not present

## 2021-07-22 DIAGNOSIS — R079 Chest pain, unspecified: Secondary | ICD-10-CM | POA: Diagnosis not present

## 2021-07-22 DIAGNOSIS — Z743 Need for continuous supervision: Secondary | ICD-10-CM | POA: Diagnosis not present

## 2021-07-22 DIAGNOSIS — R404 Transient alteration of awareness: Secondary | ICD-10-CM | POA: Diagnosis not present

## 2021-07-22 DIAGNOSIS — I10 Essential (primary) hypertension: Secondary | ICD-10-CM | POA: Diagnosis not present

## 2021-07-22 DIAGNOSIS — R61 Generalized hyperhidrosis: Secondary | ICD-10-CM | POA: Diagnosis not present

## 2021-07-22 LAB — CBC
HCT: 35.7 % — ABNORMAL LOW (ref 39.0–52.0)
Hemoglobin: 12.4 g/dL — ABNORMAL LOW (ref 13.0–17.0)
MCH: 33.4 pg (ref 26.0–34.0)
MCHC: 34.7 g/dL (ref 30.0–36.0)
MCV: 96.2 fL (ref 80.0–100.0)
Platelets: 208 10*3/uL (ref 150–400)
RBC: 3.71 MIL/uL — ABNORMAL LOW (ref 4.22–5.81)
RDW: 12.9 % (ref 11.5–15.5)
WBC: 7.7 10*3/uL (ref 4.0–10.5)
nRBC: 0 % (ref 0.0–0.2)

## 2021-07-22 LAB — URINALYSIS, ROUTINE W REFLEX MICROSCOPIC
Bilirubin Urine: NEGATIVE
Glucose, UA: NEGATIVE mg/dL
Hgb urine dipstick: NEGATIVE
Ketones, ur: NEGATIVE mg/dL
Leukocytes,Ua: NEGATIVE
Nitrite: NEGATIVE
Protein, ur: NEGATIVE mg/dL
Specific Gravity, Urine: 1.017 (ref 1.005–1.030)
pH: 5 (ref 5.0–8.0)

## 2021-07-22 LAB — BASIC METABOLIC PANEL
Anion gap: 8 (ref 5–15)
BUN: 54 mg/dL — ABNORMAL HIGH (ref 6–20)
CO2: 20 mmol/L — ABNORMAL LOW (ref 22–32)
Calcium: 9.5 mg/dL (ref 8.9–10.3)
Chloride: 104 mmol/L (ref 98–111)
Creatinine, Ser: 2.11 mg/dL — ABNORMAL HIGH (ref 0.61–1.24)
GFR, Estimated: 35 mL/min — ABNORMAL LOW (ref >60)
Glucose, Bld: 190 mg/dL — ABNORMAL HIGH (ref 70–99)
Potassium: 5.1 mmol/L (ref 3.5–5.1)
Sodium: 132 mmol/L — ABNORMAL LOW (ref 135–145)

## 2021-07-22 LAB — RESP PANEL BY RT-PCR (FLU A&B, COVID) ARPGX2
Influenza A by PCR: NEGATIVE
Influenza B by PCR: NEGATIVE
SARS Coronavirus 2 by RT PCR: POSITIVE — AB

## 2021-07-22 LAB — TROPONIN I (HIGH SENSITIVITY)
Troponin I (High Sensitivity): 14 ng/L (ref <18)
Troponin I (High Sensitivity): 22 ng/L — ABNORMAL HIGH (ref <18)

## 2021-07-22 MED ORDER — MOLNUPIRAVIR EUA 200MG CAPSULE
4.0000 | ORAL_CAPSULE | Freq: Two times a day (BID) | ORAL | Status: DC
  Administered 2021-07-22: 800 mg via ORAL
  Filled 2021-07-22: qty 4

## 2021-07-22 MED ORDER — MOLNUPIRAVIR EUA 200MG CAPSULE: 4.0000 | ORAL_CAPSULE | Freq: Two times a day (BID) | ORAL | 0 refills | Status: AC

## 2021-07-22 MED ORDER — SODIUM CHLORIDE 0.9 % IV BOLUS
1000.0000 mL | Freq: Once | INTRAVENOUS | Status: AC
  Administered 2021-07-22: 1000 mL via INTRAVENOUS

## 2021-07-22 MED ORDER — MOLNUPIRAVIR 200 MG PO CAPS: 4.0000 | ORAL_CAPSULE | Freq: Two times a day (BID) | ORAL | 0 refills | Status: AC

## 2021-07-22 MED ORDER — MOLNUPIRAVIR EUA 200MG CAPSULE: 4.0000 | ORAL_CAPSULE | Freq: Two times a day (BID) | ORAL | Status: DC

## 2021-07-22 NOTE — ED Provider Notes (Signed)
?[facility] ?Provider Note ? ? ?CSN: [id number] ?Arrival date & time: 07/22/21  1232 ? ?  ? ?History ? ?Chief Complaint  ?Patient presents with  ? Loss of Consciousness  ? ? ?Brandon Lawrence is a 61 y.o. male presenting emergency department with loss of consciousness.  The patient reports he was at church, his fianc?e at bedside and his pastor on the phone provide supplemental history.  He reports he was standing and praying, he began to feel very hot and sweaty.  He tried to get to the door to get some fresh air but then lost consciousness.  He recalls some chest discomfort and lightheadedness prior to that.  The past reports that the patient was "extremely sweaty and mumbling" for about 5 to 7 minutes.  There were no seizure activity reported.  Subsequently arriving in the ED the patient feels back to normal aside from a mild headache.  He tells me he has had several episodes of syncope in the past, not particularly associated with exertion.  His last echocardiogram was in April 2018 per my review of external records, showing a normal ejection fracture and mild regurgitation of the mitral and tricuspid valves.  There is some questionable mild hypokinesis of the apical wall. ? ?The patient was seen in the office by cardiologist 4 days ago, noted to be having chest pain issues at that time.  He was scheduled for stress test.  There are some concern about hypertensive emergency per the office evaluation. ? ?CT coronary scan in Nov 2018: ?  ?IMPRESSION: ?1. Coronary calcium score of 32. This was 66 percentile for age and ?sex matched control. ?  ?2. Normal coronary origin with right dominance. ?  ?3. Moderate stenosis in the mid LAD and at least moderate stenosis ?in a small second diagonal artery. Additional analysis with CT FFR ?will be performed. ?  ?4. Mildly dilated pulmonary artery measuring 31 mm. ? ?HPI ? ?  ? ?Home Medications ?Prior to Admission medications    ?Medication Sig Start Date End Date Taking? Authorizing Provider  ?molnupiravir EUA (LAGEVRIO) 200 mg CAPS capsule Take 4 capsules (800 mg total) by mouth 2 (two) times daily for 5 days. 07/23/21 07/28/21 Yes Pearlene Teat, Carola Rhine, MD  ?molnupiravir EUA (LAGEVRIO) 200 MG CAPS capsule Take 4 capsules (800 mg total) by mouth 2 (two) times daily for 5 days. 07/22/21 07/27/21 Yes Alilah Mcmeans, Carola Rhine, MD  ?acetaminophen (TYLENOL) 325 MG tablet Take 650 mg by mouth 4 (four) times daily as needed for mild pain, moderate pain or headache.    [provider]  ?amLODipine (NORVASC) 10 MG tablet Take 1 tablet (10 mg total) by mouth daily. 07/18/21   Werner Lean, MD  ?celecoxib (CELEBREX) 100 MG capsule Take 1 capsule (100 mg total) by mouth daily. 06/27/21   Susy Frizzle, MD  ?dexlansoprazole (DEXILANT) 60 MG capsule TAKE (1) CAPSULE BY MOUTH EVERY DAY. 06/27/21   Susy Frizzle, MD  ?diclofenac Sodium (VOLTAREN) 1 % GEL Apply 1 application topically 3 (three) times daily. 06/27/21   Susy Frizzle, MD  ?doxazosin (CARDURA) 4 MG tablet Take 1 tablet (4 mg total) by mouth daily. 04/02/21   Eulogio Bear, NP  ?DULoxetine (CYMBALTA) 30 MG capsule Take 1 capsule (30 mg total) by mouth daily. 04/02/21   Eulogio Bear, NP  ?famotidine (PEPCID) 20 MG tablet Take 1 tablet (20 mg total) by mouth at bedtime. ?Patient taking differently: Take 20 mg  by mouth daily as needed for heartburn. 10/05/20   Erenest Rasher, PA-C  ?linaclotide [facility]) 72 MCG capsule TAKE 1 CAPSULE(72 MCG) BY MOUTH DAILY BEFORE BREAKFAST 06/27/21   Susy Frizzle, MD  ?mirtazapine (REMERON) 7.5 MG tablet Take 1 tablet (7.5 mg total) by mouth at bedtime. 06/27/21   Susy Frizzle, MD  ?ondansetron (ZOFRAN) 4 MG tablet TAKE 1 TABLET BY MOUTH EVERY 8 HOURS AS NEEDED FOR NAUSEA OR VOMITING 04/14/21   Mahala Menghini, PA-C  ?promethazine (PHENERGAN) 12.5 MG tablet TAKE (1) TABLET BY MOUTH EVERY EIGHT HOURS AS NEEDED. 01/05/21    Susy Frizzle, MD  ?sucralfate (CARAFATE) 1 g tablet Take 1 tablet (1 g total) by mouth at bedtime. CRUSH 1 TABLET AND MIX WITH 1 TABLESPOONFUL OF WATER AND DRINK THREE TIMES DAILY 05/22/21   Rourk, Cristopher Estimable, MD  ?tiZANidine (ZANAFLEX) 4 MG tablet Take 1 tablet (4 mg total) by mouth every 6 (six) hours as needed for muscle spasms. 04/02/21   Eulogio Bear, NP  ?valsartan (DIOVAN) 160 MG tablet Take 1 tablet (160 mg total) by mouth daily. 06/28/21   Susy Frizzle, MD  ?   ? ?Allergies    ?Aspirin and Nsaids   ? ?Review of Systems   ?Review of Systems ? ?Physical Exam ?Updated Vital Signs ?BP (!) 154/69   Pulse 65   Temp 97.7 ?F (36.5 ?C) (Oral)   Resp 16   SpO2 100%  ?Physical Exam ?Constitutional:   ?   General: He is not in acute distress. ?HENT:  ?   Head: Normocephalic and atraumatic.  ?Eyes:  ?   Conjunctiva/sclera: Conjunctivae normal.  ?   Pupils: Pupils are equal, round, and reactive to light.  ?Cardiovascular:  ?   Rate and Rhythm: Normal rate and regular rhythm.  ?   Pulses: Normal pulses.  ?Pulmonary:  ?   Effort: Pulmonary effort is normal. No respiratory distress.  ?Abdominal:  ?   General: There is no distension.  ?   Tenderness: There is no abdominal tenderness.  ?Skin: ?   General: Skin is warm and dry.  ?Neurological:  ?   General: No focal deficit present.  ?   Mental Status: He is alert. Mental status is at baseline.  ?Psychiatric:     ?   Mood and Affect: Mood normal.     ?   Behavior: Behavior normal.  ? ? ?ED Results / Procedures / Treatments   ?Labs ?(all labs ordered are listed, but only abnormal results are displayed) ?Labs Reviewed  ?RESP PANEL BY RT-PCR (FLU A&B, COVID) ARPGX2 - Abnormal; Notable for the following components:  ?    Result Value  ? SARS Coronavirus 2 by RT PCR POSITIVE (*)   ? All other components within normal limits  ?BASIC METABOLIC PANEL - Abnormal; Notable for the following components:  ? Sodium 132 (*)   ? CO2 20 (*)   ? Glucose, Bld 190 (*)   ? BUN 54  (*)   ? Creatinine, Ser 2.11 (*)   ? GFR, Estimated 35 (*)   ? All other components within normal limits  ?CBC - Abnormal; Notable for the following components:  ? RBC 3.71 (*)   ? Hemoglobin 12.4 (*)   ? HCT 35.7 (*)   ? All other components within normal limits  ?URINALYSIS, ROUTINE W REFLEX MICROSCOPIC - Abnormal; Notable for the following components:  ? Color, Urine AMBER (*)   ? APPearance HAZY (*)   ?  All other components within normal limits  ?TROPONIN I (HIGH SENSITIVITY) - Abnormal; Notable for the following components:  ? Troponin I (High Sensitivity) 22 (*)   ? All other components within normal limits  ?CBG MONITORING, ED  ?TROPONIN I (HIGH SENSITIVITY)  ? ? ?EKG ?EKG Interpretation ? ?Date/Time:  Sunday July 22 2021 12:39:37 EDT ?Ventricular Rate:  62 ?PR Interval:  122 ?QRS Duration: 84 ?QT Interval:  394 ?QTC Calculation: 399 ?R Axis:   35 ?Text Interpretation: Normal sinus rhythm Minimal voltage criteria for LVH, may be normal variant ( Sokolow-Lyon ) Septal infarct , age undetermined Abnormal ECG When compared with ECG of 15-Jul-2021 21:58, PREVIOUS ECG IS PRESENT No significant change since last tracing Confirmed by Regan Lemming ([id number]) on 07/22/2021 12:47:29 PM ? ?Radiology ?DG Chest 2 View ? ?Result Date: 07/22/2021 ?CLINICAL DATA:  syncope evaluation EXAM: CHEST - 2 VIEW COMPARISON:  July 15, 2021. FINDINGS: No consolidation. No visible pleural effusions or pneumothorax. Similar cardiomediastinal silhouette. No evidence of acute osseous abnormality. IMPRESSION: No evidence of acute cardiopulmonary disease. Electronically Signed   By: Margaretha Sheffield M.D.   On: 07/22/2021 16:36  ? ?CT HEAD WO CONTRAST (5MM) ? ?Result Date: 07/22/2021 ?CLINICAL DATA:  Syncope EXAM: CT HEAD WITHOUT CONTRAST TECHNIQUE: Contiguous axial images were obtained from the base of the skull through the vertex without intravenous contrast. RADIATION DOSE REDUCTION: This exam was performed according to the departmental  dose-optimization program which includes automated exposure control, adjustment of the mA and/or kV according to patient size and/or use of iterative reconstruction technique. COMPARISON:  CT head 07/15/2021 FINDINGS: Brain: No

## 2021-07-22 NOTE — ED Notes (Signed)
Pharmacy sent bottle of molnupiravir that contains full course of treatment for pt. ED provider wrote a prescription for pt to pick up a bottle of this medication at pt's pharmacy, stating that he believed pharmacy would send a one-time dose for pt to receive prior to discharge. ED provider has left the emergency department at the time of this note. RN called pharmacy and spoke with a pharmacist who stated to this RN that pt should NOT pick up their prescription of molnupiravir from their pharmacy, as the bottle of this medication (sent to ED from [facility] main pharmacy) contains the full course of medication needed.  ?

## 2021-07-22 NOTE — Discharge Instructions (Signed)
You were diagnosed with COVID in the ER today.  This is likely the cause of your episode at church today.  You need to keep yourself well-hydrated by drinking plenty of water at home.  I also started you on an antiviral medicine that you will need to pick up from the pharmacy and begin taking tomorrow as prescribed.  This will help reduce the severity and duration of your COVID.  You should quarantine at home for minimum of 7 days from the start of your symptoms today.  You can be contagious for up to 10 days from the start of your symptoms. ? ?You will need to call your doctors office as well as the cardiologist office to let them know that you are positive for COVID.  They may need to reschedule your office appointment and your stress test to the following week. ?

## 2021-07-22 NOTE — ED Triage Notes (Addendum)
Patient BIB [facility] from the patient's church where he felt hot then lost consciousness for approximately four minutes according to witnesses. 18g saline lock in right AC. Patient is alert, oriented, and in no apparent distress at this time. Patient states she has had multiple episodes of passing out over the last several months, last incident one month ago. Scheduled stress test this week. ? ?EMS vitals ?BP 139/70 ?HR 60 ?CBG 183 ? ?Patient denies pain, denies hitting his head, and is not on an anticoagulant. ?

## 2021-07-23 ENCOUNTER — Ambulatory Visit: Payer: Medicare Other | Admitting: Family Medicine

## 2021-07-24 ENCOUNTER — Ambulatory Visit: Payer: Medicare Other | Admitting: Family Medicine

## 2021-07-26 ENCOUNTER — Encounter (HOSPITAL_COMMUNITY): Payer: Medicare Other

## 2021-07-27 ENCOUNTER — Encounter: Payer: Self-pay | Admitting: Family Medicine

## 2021-07-27 ENCOUNTER — Ambulatory Visit (INDEPENDENT_AMBULATORY_CARE_PROVIDER_SITE_OTHER): Payer: Medicare Other | Admitting: Family Medicine

## 2021-07-27 ENCOUNTER — Other Ambulatory Visit: Payer: Self-pay

## 2021-07-27 VITALS — BP 160/64 | HR 69 | Temp 97.4°F | Ht 65.0 in | Wt 135.6 lb

## 2021-07-27 DIAGNOSIS — N289 Disorder of kidney and ureter, unspecified: Secondary | ICD-10-CM | POA: Diagnosis not present

## 2021-07-27 DIAGNOSIS — I10 Essential (primary) hypertension: Secondary | ICD-10-CM

## 2021-07-27 MED ORDER — NEBIVOLOL HCL 5 MG PO TABS: 5.0000 mg | ORAL_TABLET | Freq: Every day | ORAL | 3 refills | Status: DC
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
## 2021-07-28 LAB — BASIC METABOLIC PANEL WITH GFR
BUN/Creatinine Ratio: 18 (calc) (ref 6–22)
BUN: 32 mg/dL — ABNORMAL HIGH (ref 7–25)
CO2: 27 mmol/L (ref 20–32)
Calcium: 10 mg/dL (ref 8.6–10.3)
Chloride: 103 mmol/L (ref 98–110)
Creat: 1.74 mg/dL — ABNORMAL HIGH (ref 0.70–1.30)
Glucose, Bld: 107 mg/dL — ABNORMAL HIGH (ref 65–99)
Potassium: 4.7 mmol/L (ref 3.5–5.3)
Sodium: 137 mmol/L (ref 135–146)
eGFR: 45 mL/min/{1.73_m2} — ABNORMAL LOW (ref >=60)

## 2021-07-31 ENCOUNTER — Other Ambulatory Visit: Payer: Self-pay | Admitting: Nurse Practitioner

## 2021-07-31 ENCOUNTER — Telehealth (HOSPITAL_COMMUNITY): Payer: Self-pay | Admitting: *Deleted

## 2021-07-31 DIAGNOSIS — G8929 Other chronic pain: Secondary | ICD-10-CM

## 2021-07-31 NOTE — Telephone Encounter (Signed)
Patient given detailed instructions per Myocardial Perfusion Study Information Sheet for the test on 08/06/21. Patient notified to arrive 15 minutes early and that it is imperative to arrive on time for appointment to keep from having the test rescheduled. ? If you need to cancel or reschedule your appointment, please call the office within 24 hours of your appointment. . Patient verbalized understanding. Kirstie Peri ? ? ?

## 2021-08-02 ENCOUNTER — Telehealth: Payer: Self-pay

## 2021-08-02 NOTE — Telephone Encounter (Signed)
Mavis an NP with [facility] did a home visit with pt and performed a PAD test. NP stated that the left foot reading was 0.50 and the right foot was 1.20. NP just wanted to make pcp aware of these test results. NP stated that they will do a f/u appt with pt in a week. If nurse or pcp has any questions please feel free to call NP Mavis back at number listed. ? ? ?Cb#: Printmaker Nurse Practitioner with [facility] [phone] ?

## 2021-08-06 ENCOUNTER — Ambulatory Visit (HOSPITAL_COMMUNITY): Payer: Medicare Other | Attending: Cardiology

## 2021-08-06 DIAGNOSIS — R079 Chest pain, unspecified: Secondary | ICD-10-CM | POA: Insufficient documentation

## 2021-08-06 LAB — MYOCARDIAL PERFUSION IMAGING
Base ST Depression (mm): 0 mm
LV dias vol: 122 mL (ref 62–150)
LV sys vol: 51 mL
Nuc Stress EF: 59 %
Peak HR: 89 {beats}/min
Rest HR: 54 {beats}/min
Rest Nuclear Isotope Dose: 9.6 mCi
SDS: 0
SRS: 0
SSS: 0
ST Depression (mm): 0 mm
Stress Nuclear Isotope Dose: 31.7 mCi
TID: 1.02

## 2021-08-06 MED ORDER — TECHNETIUM TC 99M TETROFOSMIN IV KIT
9.6000 | PACK | Freq: Once | INTRAVENOUS | Status: AC | PRN
  Administered 2021-08-06: 9.6 via INTRAVENOUS
  Filled 2021-08-06: qty 10

## 2021-08-06 MED ORDER — REGADENOSON 0.4 MG/5ML IV SOLN
0.4000 mg | Freq: Once | INTRAVENOUS | Status: AC
  Administered 2021-08-06: 0.4 mg via INTRAVENOUS

## 2021-08-06 MED ORDER — TECHNETIUM TC 99M TETROFOSMIN IV KIT
31.7000 | PACK | Freq: Once | INTRAVENOUS | Status: AC | PRN
  Administered 2021-08-06: 31.7 via INTRAVENOUS
  Filled 2021-08-06: qty 32

## 2021-08-14 ENCOUNTER — Ambulatory Visit (INDEPENDENT_AMBULATORY_CARE_PROVIDER_SITE_OTHER): Payer: Medicare Other | Admitting: Family Medicine

## 2021-08-14 VITALS — BP 124/60 | HR 60 | Temp 97.1°F | Ht 65.0 in | Wt 141.8 lb

## 2021-08-14 DIAGNOSIS — N289 Disorder of kidney and ureter, unspecified: Secondary | ICD-10-CM | POA: Diagnosis not present

## 2021-08-14 DIAGNOSIS — I10 Essential (primary) hypertension: Secondary | ICD-10-CM | POA: Diagnosis not present
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
## 2021-08-15 LAB — BASIC METABOLIC PANEL WITH GFR
BUN/Creatinine Ratio: 17 (calc) (ref 6–22)
BUN: 34 mg/dL — ABNORMAL HIGH (ref 7–25)
CO2: 24 mmol/L (ref 20–32)
Calcium: 9.3 mg/dL (ref 8.6–10.3)
Chloride: 107 mmol/L (ref 98–110)
Creat: 1.99 mg/dL — ABNORMAL HIGH (ref 0.70–1.30)
Glucose, Bld: 140 mg/dL — ABNORMAL HIGH (ref 65–99)
Potassium: 4.6 mmol/L (ref 3.5–5.3)
Sodium: 138 mmol/L (ref 135–146)
eGFR: 38 mL/min/{1.73_m2} — ABNORMAL LOW (ref >=60)

## 2021-08-16 ENCOUNTER — Other Ambulatory Visit: Payer: Self-pay | Admitting: Family Medicine

## 2021-08-16 DIAGNOSIS — N1832 Chronic kidney disease, stage 3b: Secondary | ICD-10-CM

## 2021-08-29 ENCOUNTER — Ambulatory Visit: Payer: Medicare Other | Admitting: Neurology

## 2021-08-29 ENCOUNTER — Encounter: Payer: Self-pay | Admitting: Neurology

## 2021-08-29 DIAGNOSIS — R519 Headache, unspecified: Secondary | ICD-10-CM | POA: Diagnosis not present

## 2021-08-29 DIAGNOSIS — Z91199 Patient's noncompliance with other medical treatment and regimen due to unspecified reason: Secondary | ICD-10-CM | POA: Insufficient documentation
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
## 2021-08-29 NOTE — Patient Instructions (Signed)
No Follow up in Sleep Neurology will be scheduled.  ? ?

## 2021-09-10 DIAGNOSIS — C642 Malignant neoplasm of left kidney, except renal pelvis: Secondary | ICD-10-CM | POA: Diagnosis not present

## 2021-09-10 DIAGNOSIS — Z905 Acquired absence of kidney: Secondary | ICD-10-CM | POA: Diagnosis not present

## 2021-09-10 DIAGNOSIS — E1122 Type 2 diabetes mellitus with diabetic chronic kidney disease: Secondary | ICD-10-CM | POA: Diagnosis not present

## 2021-09-10 DIAGNOSIS — N189 Chronic kidney disease, unspecified: Secondary | ICD-10-CM | POA: Diagnosis not present

## 2021-09-10 DIAGNOSIS — D638 Anemia in other chronic diseases classified elsewhere: Secondary | ICD-10-CM | POA: Diagnosis not present

## 2021-09-10 DIAGNOSIS — I129 Hypertensive chronic kidney disease with stage 1 through stage 4 chronic kidney disease, or unspecified chronic kidney disease: Secondary | ICD-10-CM | POA: Diagnosis not present

## 2021-09-10 DIAGNOSIS — N170 Acute kidney failure with tubular necrosis: Secondary | ICD-10-CM | POA: Diagnosis not present

## 2021-09-13 DIAGNOSIS — I129 Hypertensive chronic kidney disease with stage 1 through stage 4 chronic kidney disease, or unspecified chronic kidney disease: Secondary | ICD-10-CM | POA: Diagnosis not present

## 2021-09-13 DIAGNOSIS — N170 Acute kidney failure with tubular necrosis: Secondary | ICD-10-CM | POA: Diagnosis not present

## 2021-09-13 DIAGNOSIS — N189 Chronic kidney disease, unspecified: Secondary | ICD-10-CM | POA: Diagnosis not present

## 2021-09-13 DIAGNOSIS — E1122 Type 2 diabetes mellitus with diabetic chronic kidney disease: Secondary | ICD-10-CM | POA: Diagnosis not present

## 2021-09-13 DIAGNOSIS — D511 Vitamin B12 deficiency anemia due to selective vitamin B12 malabsorption with proteinuria: Secondary | ICD-10-CM | POA: Diagnosis not present

## 2021-09-13 DIAGNOSIS — E559 Vitamin D deficiency, unspecified: Secondary | ICD-10-CM | POA: Diagnosis not present
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
## 2021-09-19 ENCOUNTER — Ambulatory Visit: Payer: Medicare Other | Admitting: Gastroenterology

## 2021-09-19 ENCOUNTER — Encounter: Payer: Self-pay | Admitting: Gastroenterology

## 2021-09-19 VITALS — BP 118/59 | HR 52 | Temp 97.2°F | Ht 65.0 in | Wt 145.0 lb

## 2021-09-19 DIAGNOSIS — K5900 Constipation, unspecified: Secondary | ICD-10-CM

## 2021-09-19 DIAGNOSIS — R1013 Epigastric pain: Secondary | ICD-10-CM | POA: Diagnosis not present

## 2021-09-19 DIAGNOSIS — K219 Gastro-esophageal reflux disease without esophagitis: Secondary | ICD-10-CM

## 2021-09-19 DIAGNOSIS — D649 Anemia, unspecified: Secondary | ICD-10-CM

## 2021-09-19 NOTE — Patient Instructions (Signed)
Continue taking Dexilant 60 mg daily. ? ?You can try stopping Carafate and monitor for recurrent upper abdominal discomfort.  If your symptoms return, you may resume Carafate at bedtime. ? ?Continue to avoid all NSAID products including ibuprofen, Aleve, Advil, naproxen, Celebrex, and anything that says "NSAID" on the package. ? ?Continue Linzess 72 mcg daily for constipation. ? ?We will plan to follow-up with you in 6 months.  Do not hesitate to call if you have any questions or concerns prior to your next visit. ? ?It was nice meeting you today! ? ?Aliene Altes, PA-C ?[facility] ? ?

## 2021-09-26 ENCOUNTER — Telehealth: Payer: Self-pay | Admitting: Family Medicine

## 2021-09-26 NOTE — Telephone Encounter (Signed)
Left message for patient to call back and schedule Medicare Annual Wellness Visit (AWV) in office.   If not able to come in office, please offer to do virtually or by telephone.  Left office number and my jabber #[phone].  Last AWV:09/27/2020  Please schedule at anytime with Nurse Health Advisor.   

## 2021-10-11 ENCOUNTER — Ambulatory Visit: Payer: Medicare Other | Admitting: Nurse Practitioner

## 2021-10-12 ENCOUNTER — Other Ambulatory Visit: Payer: Self-pay | Admitting: Nurse Practitioner

## 2021-10-12 DIAGNOSIS — I10 Essential (primary) hypertension: Secondary | ICD-10-CM

## 2021-10-12 NOTE — Telephone Encounter (Signed)
Requested Prescriptions  Pending Prescriptions Disp Refills  . doxazosin (CARDURA) 4 MG tablet [Pharmacy Med Name: DOXAZOSIN 4 MG TABLETS] 90 tablet 0    Sig: Take 1 tablet (4 mg total) by mouth daily. OFFICE VISIT NEEDED FOR ADDITIONAL REFILLS     Cardiovascular:  Alpha Blockers Passed - 10/12/2021  4:18 PM      Passed - Last BP in normal range    BP Readings from Last 1 Encounters:  09/19/21 (!) 118/59         Passed - Valid encounter within last 6 months    Recent Outpatient Visits          1 month ago Essential hypertension, benign   [place] [facility] Schaumann, Cammie Mcgee, MD   2 months ago Essential hypertension, benign   [place] Dennard Schaumann, Cammie Mcgee, MD   2 months ago Essential hypertension, benign   [place] Dennard Schaumann, Cammie Mcgee, MD   3 months ago Chest pain, unspecified type   [place] Susy Frizzle, MD   6 months ago Chronic midline low back pain with bilateral sciatica   [place] Eulogio Bear, NP

## 2021-10-20 ENCOUNTER — Encounter (HOSPITAL_BASED_OUTPATIENT_CLINIC_OR_DEPARTMENT_OTHER): Payer: Self-pay | Admitting: Emergency Medicine

## 2021-10-20 ENCOUNTER — Other Ambulatory Visit: Payer: Self-pay

## 2021-10-20 ENCOUNTER — Emergency Department (HOSPITAL_BASED_OUTPATIENT_CLINIC_OR_DEPARTMENT_OTHER)
Admission: EM | Admit: 2021-10-20 | Discharge: 2021-10-20 | Disposition: A | Discharge status: Home / Self Care | Payer: Medicare Other | Attending: Emergency Medicine | Admitting: Emergency Medicine

## 2021-10-20 ENCOUNTER — Emergency Department (HOSPITAL_BASED_OUTPATIENT_CLINIC_OR_DEPARTMENT_OTHER): Payer: Medicare Other

## 2021-10-20 DIAGNOSIS — R002 Palpitations: Secondary | ICD-10-CM | POA: Insufficient documentation

## 2021-10-20 DIAGNOSIS — R001 Bradycardia, unspecified: Secondary | ICD-10-CM | POA: Insufficient documentation

## 2021-10-20 DIAGNOSIS — I10 Essential (primary) hypertension: Secondary | ICD-10-CM | POA: Insufficient documentation

## 2021-10-20 DIAGNOSIS — R55 Syncope and collapse: Secondary | ICD-10-CM | POA: Diagnosis not present

## 2021-10-20 DIAGNOSIS — R42 Dizziness and giddiness: Secondary | ICD-10-CM | POA: Diagnosis present

## 2021-10-20 DIAGNOSIS — Z79899 Other long term (current) drug therapy: Secondary | ICD-10-CM | POA: Insufficient documentation

## 2021-10-20 DIAGNOSIS — Z85528 Personal history of other malignant neoplasm of kidney: Secondary | ICD-10-CM | POA: Insufficient documentation

## 2021-10-20 DIAGNOSIS — D649 Anemia, unspecified: Secondary | ICD-10-CM | POA: Insufficient documentation

## 2021-10-20 LAB — CBC
HCT: 35.2 % — ABNORMAL LOW (ref 39.0–52.0)
Hemoglobin: 11.3 g/dL — ABNORMAL LOW (ref 13.0–17.0)
MCH: 30.6 pg (ref 26.0–34.0)
MCHC: 32.1 g/dL (ref 30.0–36.0)
MCV: 95.4 fL (ref 80.0–100.0)
Platelets: 219 10*3/uL (ref 150–400)
RBC: 3.69 MIL/uL — ABNORMAL LOW (ref 4.22–5.81)
RDW: 13.4 % (ref 11.5–15.5)
WBC: 7.4 10*3/uL (ref 4.0–10.5)
nRBC: 0 % (ref 0.0–0.2)

## 2021-10-20 LAB — URINALYSIS, ROUTINE W REFLEX MICROSCOPIC
Bilirubin Urine: NEGATIVE
Glucose, UA: NEGATIVE mg/dL
Hgb urine dipstick: NEGATIVE
Ketones, ur: NEGATIVE mg/dL
Leukocytes,Ua: NEGATIVE
Nitrite: NEGATIVE
Protein, ur: NEGATIVE mg/dL
Specific Gravity, Urine: 1.014 (ref 1.005–1.030)
pH: 5 (ref 5.0–8.0)

## 2021-10-20 LAB — HEPATIC FUNCTION PANEL
ALT: 13 U/L (ref 0–44)
AST: 19 U/L (ref 15–41)
Albumin: 4.4 g/dL (ref 3.5–5.0)
Alkaline Phosphatase: 38 U/L (ref 38–126)
Bilirubin, Direct: 0.1 mg/dL (ref 0.0–0.2)
Indirect Bilirubin: 0.3 mg/dL (ref 0.3–0.9)
Total Bilirubin: 0.4 mg/dL (ref 0.3–1.2)
Total Protein: 8 g/dL (ref 6.5–8.1)

## 2021-10-20 LAB — MAGNESIUM: Magnesium: 1.9 mg/dL (ref 1.7–2.4)

## 2021-10-20 LAB — BASIC METABOLIC PANEL
Anion gap: 7 (ref 5–15)
BUN: 29 mg/dL — ABNORMAL HIGH (ref 6–20)
CO2: 27 mmol/L (ref 22–32)
Calcium: 10.1 mg/dL (ref 8.9–10.3)
Chloride: 105 mmol/L (ref 98–111)
Creatinine, Ser: 1.66 mg/dL — ABNORMAL HIGH (ref 0.61–1.24)
GFR, Estimated: 47 mL/min — ABNORMAL LOW (ref >60)
Glucose, Bld: 88 mg/dL (ref 70–99)
Potassium: 3.9 mmol/L (ref 3.5–5.1)
Sodium: 139 mmol/L (ref 135–145)

## 2021-10-20 LAB — TSH: TSH: 1.126 u[IU]/mL (ref 0.350–4.500)

## 2021-10-20 LAB — D-DIMER, QUANTITATIVE: D-Dimer, Quant: 0.42 ug/mL-FEU (ref 0.00–0.50)

## 2021-10-20 LAB — TROPONIN I (HIGH SENSITIVITY)
Troponin I (High Sensitivity): 13 ng/L (ref <18)
Troponin I (High Sensitivity): 14 ng/L (ref <18)

## 2021-10-20 LAB — CBG MONITORING, ED: Glucose-Capillary: 86 mg/dL (ref 70–99)

## 2021-10-20 MED ORDER — SODIUM CHLORIDE 0.9 % IV BOLUS
1000.0000 mL | Freq: Once | INTRAVENOUS | Status: AC
  Administered 2021-10-20: 1000 mL via INTRAVENOUS

## 2021-10-20 NOTE — ED Provider Notes (Signed)
[facility] EMERGENC[facility] DEPT Provider Note   CSN: [id number] Arrival date & time: 10/20/21  5093     History  Chief Complaint  Patient presents with   Dizziness    Brandon Lawrence is a 61 y.o. male.  Pt is a 61 yo male with a pmhx significant for gerd, depression, renal cell carcinoma, htn, and OSA.  Pt said he was standing at the kitchen counter and felt dizzy, lightheaded, and weak.  Family said he broke out into a sweat and that his HR was fast.  He said that lasted about 25 minutes.  He feels back to normal now.  He denies cp.  He had an episode similar to this in March and was seen in the ED.  He was found to have had Covid then.  He did get a stress test in April that was normal.  Pt does have a hx of hard to control htn and is on 4 meds for bp (amlodipine, Bystolic, doxazosin, valsartan).         Home Medications Prior to Admission medications   Medication Sig Start Date End Date Taking? Authorizing Provider  amLODipine (NORVASC) 10 MG tablet Take 1 tablet (10 mg total) by mouth daily. 07/18/21   Chandrasekhar, Terisa Starr, MD  dexlansoprazole (DEXILANT) 60 MG capsule TAKE (1) CAPSULE BY MOUTH EVERY DAY. 06/27/21   Susy Frizzle, MD  diclofenac Sodium (VOLTAREN) 1 % GEL Apply 1 application topically 3 (three) times daily. 06/27/21   Susy Frizzle, MD  doxazosin (CARDURA) 4 MG tablet Take 1 tablet (4 mg total) by mouth daily. OFFICE VISIT NEEDED FOR ADDITIONAL REFILLS 10/12/21   Susy Frizzle, MD  DULoxetine (CYMBALTA) 30 MG capsule TAKE ONE CAPSULE BY MOUTH ONCE DAILY. 08/01/21   Susy Frizzle, MD  linaclotide [employer]) 72 MCG capsule TAKE 1 CAPSULE(72 MCG) BY MOUTH DAILY BEFORE BREAKFAST 06/27/21   Susy Frizzle, MD  mirtazapine (REMERON) 7.5 MG tablet Take 1 tablet (7.5 mg total) by mouth at bedtime. 06/27/21   Susy Frizzle, MD  nebivolol (BYSTOLIC) 5 MG tablet Take 1 tablet (5 mg total) by mouth daily. 07/27/21   Susy Frizzle, MD  sucralfate  (CARAFATE) 1 g tablet Take 1 tablet (1 g total) by mouth at bedtime. CRUSH 1 TABLET AND MIX WITH 1 TABLESPOONFUL OF WATER AND DRINK THREE TIMES DAILY 05/22/21   Rourk, Cristopher Estimable, MD  valsartan (DIOVAN) 160 MG tablet Take 1 tablet (160 mg total) by mouth daily. 06/28/21   Susy Frizzle, MD      Allergies    Aspirin and Nsaids    Review of Systems   Review of Systems  Cardiovascular:  Positive for palpitations.  Neurological:        Near syncope  All other systems reviewed and are negative.   Physical Exam Updated Vital Signs BP (!) 158/72   Pulse (!) 47   Temp 97.9 F (36.6 C)   Resp 18   Ht '5\' 5"'$  (1.651 m)   Wt 65.8 kg   SpO2 99%   BMI 24.13 kg/m  Physical Exam Vitals and nursing note reviewed.  Constitutional:      Appearance: Normal appearance.  HENT:     Head: Normocephalic and atraumatic.     Right Ear: External ear normal.     Left Ear: External ear normal.     Nose: Nose normal.     Mouth/Throat:     Mouth: Mucous membranes are moist.  Pharynx: Oropharynx is clear.  Eyes:     Extraocular Movements: Extraocular movements intact.     Conjunctiva/sclera: Conjunctivae normal.     Pupils: Pupils are equal, round, and reactive to light.  Cardiovascular:     Rate and Rhythm: Regular rhythm. Bradycardia present.     Pulses: Normal pulses.     Heart sounds: Normal heart sounds.  Pulmonary:     Effort: Pulmonary effort is normal.     Breath sounds: Normal breath sounds.  Abdominal:     General: Abdomen is flat. Bowel sounds are normal.     Palpations: Abdomen is soft.  Musculoskeletal:        General: Normal range of motion.     Cervical back: Normal range of motion and neck supple.  Skin:    General: Skin is warm.     Capillary Refill: Capillary refill takes less than 2 seconds.  Neurological:     General: No focal deficit present.     Mental Status: He is alert and oriented to person, place, and time.  Psychiatric:        Mood and Affect: Mood normal.         Behavior: Behavior normal.     ED Results / Procedures / Treatments   Labs (all labs ordered are listed, but only abnormal results are displayed) Labs Reviewed  BASIC METABOLIC PANEL - Abnormal; Notable for the following components:      Result Value   BUN 29 (*)    Creatinine, Ser 1.66 (*)    GFR, Estimated 47 (*)    All other components within normal limits  CBC - Abnormal; Notable for the following components:   RBC 3.69 (*)    Hemoglobin 11.3 (*)    HCT 35.2 (*)    All other components within normal limits  URINALYSIS, ROUTINE W REFLEX MICROSCOPIC  D-DIMER, QUANTITATIVE  TSH  MAGNESIUM  HEPATIC FUNCTION PANEL  CBG MONITORING, ED  TROPONIN I (HIGH SENSITIVITY)  TROPONIN I (HIGH SENSITIVITY)    EKG EKG Interpretation  Date/Time:  Saturday October 20 2021 09:29:56 EDT Ventricular Rate:  47 PR Interval:  138 QRS Duration: 94 QT Interval:  462 QTC Calculation: 408 R Axis:   -14 Text Interpretation: Sinus bradycardia Minimal voltage criteria for LVH, may be normal variant ( Cornell product ) Nonspecific ST abnormality Abnormal ECG When compared with ECG of 22-Jul-2021 12:39, Criteria for Septal infarct are no longer Present Nonspecific T wave abnormality now evident in Anterior leads No significant change since last tracing Confirmed by Isla Pence [phone]) on 10/20/2021 11:51:16 AM  Radiology DG Chest Portable 1 View  Result Date: 10/20/2021 CLINICAL DATA:  Near syncope. EXAM: PORTABLE CHEST 1 VIEW COMPARISON:  07/22/2021 FINDINGS: Lungs are adequately inflated without consolidation or effusion. Cardiomediastinal silhouette and remainder of the exam is unchanged. IMPRESSION: No active disease. Electronically Signed   By: Marin Olp M.D.   On: 10/20/2021 11:29    Procedures Procedures    Medications Ordered in ED Medications  sodium chloride 0.9 % bolus 1,000 mL (1,000 mLs Intravenous New Bag/Given 10/20/21 1049)    ED Course/ Medical Decision Making/  A&P                           Medical Decision Making Amount and/or Complexity of Data Reviewed Labs: ordered. Radiology: ordered.   This patient presents to the ED for concern of palpitations, this involves an extensive number of treatment  options, and is a complaint that carries with it a high risk of complications and morbidity.  The differential diagnosis includes afib, svt, sinus tach   Co morbidities that complicate the patient evaluation   gerd, depression, renal cell carcinoma, htn, and OSA   Additional history obtained:  Additional history obtained from epic chart review External records from outside source obtained and reviewed including family   Lab Tests:  I Ordered, and personally interpreted labs.  The pertinent results include:  cbc with mild, chronic anemia (hgb 11.3), bmp with Cr 1.66 (chronic), LFTs nl, mg nl, ddimer neg, trop nl; ua nl   Imaging Studies ordered:  I ordered imaging studies including cxr  I independently visualized and interpreted imaging which showed  IMPRESSION:  No active disease.   I agree with the radiologist interpretation   Cardiac Monitoring:  The patient was maintained on a cardiac monitor.  I personally viewed and interpreted the cardiac monitored which showed an underlying rhythm of: sinus brady   Medicines ordered and prescription drug management:  I ordered medication including ivfs  for dehydration  Reevaluation of the patient after these medicines showed that the patient improved I have reviewed the patients home medicines and have made adjustments as needed   Test Considered:  Ct head, but neurologically intact   Critical Interventions:  ivfs    Problem List / ED Course:  Palpitations/near-syncope:  work up here unremarkable.  He has been observed for several hrs and feels good.  I have referred him back to cards to consider monitor.  Pt is stable for d/c.  He is to return if worse.  F/u with  cards/pcp.   Reevaluation:  After the interventions noted above, I reevaluated the patient and found that they have :improved   Social Determinants of Health:  Lives at home   Dispostion:  After consideration of the diagnostic results and the patients response to treatment, I feel that the patent would benefit from discharge with outpatient f/u.          Final Clinical Impression(s) / ED Diagnoses Final diagnoses:  Palpitations  Near syncope    Rx / DC Orders ED Discharge Orders          Ordered    Ambulatory referral to Cardiology       Comments: If you have not heard from the Cardiology office within the next 72 hours please call [phone].   10/20/21 1336              Isla Pence, MD 10/20/21 1338

## 2021-10-20 NOTE — ED Notes (Signed)
Blue top also sent

## 2021-10-20 NOTE — ED Notes (Signed)
Pt stated he did not feel dizzy while doing orthostatics.

## 2021-10-20 NOTE — ED Triage Notes (Signed)
Pt via pov from home with a spell of dizziness/weakness and sweating about an hour ago, which has resolved. Pt reports that he is cold now, denies any weakness. Pt alert & oriented, nad noted.

## 2021-10-20 NOTE — ED Notes (Signed)
Dc instructions reviewed with patient. Patient voiced understanding. Dc with belongings.  °

## 2021-10-24 ENCOUNTER — Other Ambulatory Visit: Payer: Self-pay | Admitting: Nephrology

## 2021-10-24 ENCOUNTER — Other Ambulatory Visit (HOSPITAL_COMMUNITY): Payer: Self-pay | Admitting: Nephrology

## 2021-10-24 DIAGNOSIS — I129 Hypertensive chronic kidney disease with stage 1 through stage 4 chronic kidney disease, or unspecified chronic kidney disease: Secondary | ICD-10-CM

## 2021-10-24 DIAGNOSIS — R809 Proteinuria, unspecified: Secondary | ICD-10-CM | POA: Diagnosis not present

## 2021-10-24 DIAGNOSIS — N189 Chronic kidney disease, unspecified: Secondary | ICD-10-CM | POA: Diagnosis not present

## 2021-10-24 DIAGNOSIS — D638 Anemia in other chronic diseases classified elsewhere: Secondary | ICD-10-CM | POA: Diagnosis not present

## 2021-10-24 DIAGNOSIS — E1122 Type 2 diabetes mellitus with diabetic chronic kidney disease: Secondary | ICD-10-CM

## 2021-10-24 DIAGNOSIS — R768 Other specified abnormal immunological findings in serum: Secondary | ICD-10-CM | POA: Diagnosis not present

## 2021-10-24 DIAGNOSIS — Z905 Acquired absence of kidney: Secondary | ICD-10-CM | POA: Diagnosis not present

## 2021-10-24 DIAGNOSIS — E1129 Type 2 diabetes mellitus with other diabetic kidney complication: Secondary | ICD-10-CM

## 2021-10-24 DIAGNOSIS — N170 Acute kidney failure with tubular necrosis: Secondary | ICD-10-CM

## 2021-10-25 ENCOUNTER — Ambulatory Visit (INDEPENDENT_AMBULATORY_CARE_PROVIDER_SITE_OTHER): Payer: Medicare Other | Admitting: Family Medicine

## 2021-10-25 VITALS — BP 140/60 | HR 48 | Temp 98.6°F | Resp 18 | Wt 152.0 lb

## 2021-10-25 DIAGNOSIS — R55 Syncope and collapse: Secondary | ICD-10-CM | POA: Diagnosis not present
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
## 2021-10-30 DIAGNOSIS — G4733 Obstructive sleep apnea (adult) (pediatric): Secondary | ICD-10-CM | POA: Diagnosis not present

## 2021-10-31 ENCOUNTER — Ambulatory Visit (HOSPITAL_COMMUNITY)
Admission: RE | Admit: 2021-10-31 | Discharge: 2021-10-31 | Disposition: A | Discharge status: Home / Self Care | Payer: Medicare Other | Source: Ambulatory Visit | Attending: Nephrology | Admitting: Nephrology

## 2021-10-31 DIAGNOSIS — R809 Proteinuria, unspecified: Secondary | ICD-10-CM | POA: Insufficient documentation

## 2021-10-31 DIAGNOSIS — E1129 Type 2 diabetes mellitus with other diabetic kidney complication: Secondary | ICD-10-CM | POA: Insufficient documentation

## 2021-10-31 DIAGNOSIS — N170 Acute kidney failure with tubular necrosis: Secondary | ICD-10-CM | POA: Diagnosis not present

## 2021-10-31 DIAGNOSIS — N189 Chronic kidney disease, unspecified: Secondary | ICD-10-CM | POA: Diagnosis not present

## 2021-10-31 DIAGNOSIS — I129 Hypertensive chronic kidney disease with stage 1 through stage 4 chronic kidney disease, or unspecified chronic kidney disease: Secondary | ICD-10-CM | POA: Diagnosis not present

## 2021-10-31 DIAGNOSIS — E1122 Type 2 diabetes mellitus with diabetic chronic kidney disease: Secondary | ICD-10-CM | POA: Insufficient documentation

## 2021-10-31 DIAGNOSIS — R768 Other specified abnormal immunological findings in serum: Secondary | ICD-10-CM | POA: Diagnosis not present

## 2021-10-31 DIAGNOSIS — N179 Acute kidney failure, unspecified: Secondary | ICD-10-CM | POA: Diagnosis not present

## 2021-11-07 ENCOUNTER — Telehealth: Payer: Self-pay | Admitting: Family Medicine

## 2021-11-07 NOTE — Telephone Encounter (Signed)
Left message for patient to call back and schedule Medicare Annual Wellness Visit (AWV) in office.   If not able to come in office, please offer to do virtually or by telephone.  Left office number and my jabber [phone].  Last AWV:09/27/2020  Please schedule at anytime with Nurse Health Advisor.

## 2021-11-19 ENCOUNTER — Ambulatory Visit (INDEPENDENT_AMBULATORY_CARE_PROVIDER_SITE_OTHER): Payer: Medicare Other

## 2021-11-19 ENCOUNTER — Encounter: Payer: Self-pay | Admitting: Physician Assistant

## 2021-11-19 ENCOUNTER — Ambulatory Visit: Payer: Medicare Other | Admitting: Physician Assistant

## 2021-11-19 VITALS — BP 138/66 | HR 52 | Ht 65.0 in | Wt 149.2 lb

## 2021-11-19 DIAGNOSIS — R55 Syncope and collapse: Secondary | ICD-10-CM

## 2021-11-19 DIAGNOSIS — I10 Essential (primary) hypertension: Secondary | ICD-10-CM | POA: Diagnosis not present

## 2021-11-19 DIAGNOSIS — I2510 Atherosclerotic heart disease of native coronary artery without angina pectoris: Secondary | ICD-10-CM | POA: Diagnosis not present
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
## 2021-11-19 NOTE — Progress Notes (Unsigned)
Enrolled for [employer] to mail a ZIO AT Live Telemetry monitor to patients address on file.  ZIO AT serial # [id number] mailed to patient 11/19/21 and applied to patient in office on 12/03/2021.   Dr. Gasper Sells to read.

## 2021-11-19 NOTE — Patient Instructions (Signed)
Medication Instructions:  Your physician recommends that you continue on your current medications as directed. Please refer to the Current Medication list given to you today.  *If you need a refill on your cardiac medications before your next appointment, please call your pharmacy*   Lab Work: NONE If you have labs (blood work) drawn today and your tests are completely normal, you will receive your results only by: [place] (if you have MyChart) OR A paper copy in the mail If you have any lab test that is abnormal or we need to change your treatment, we will call you to review the results.   Testing/Procedures: Your physician has requested that you have an echocardiogram. Echocardiography is a painless test that uses sound waves to create images of your heart. It provides your doctor with information about the size and shape of your heart and how well your heart's chambers and valves are working. This procedure takes approximately one hour. There are no restrictions for this procedure.   ZIO AT Long term monitor-Live Telemetry  Your physician has requested you wear a ZIO patch monitor for 14 days.  This is a single patch monitor. [employer] supplies one patch monitor per enrollment. Additional  stickers are not available.  Please do not apply patch if you will be having a Nuclear Stress Test, Echocardiogram, Cardiac CT, MRI,  or Chest Xray during the period you would be wearing the monitor. The patch cannot be worn during  these tests. You cannot remove and re-apply the ZIO AT patch monitor.  Your ZIO patch monitor will be mailed 3 day USPS to your address on file. It may take 3-5 days to  receive your monitor after you have been enrolled.  Once you have received your monitor, please review the enclosed instructions. Your monitor has  already been registered assigning a specific monitor serial # to you.   Billing and Patient Assistance Program information  Theodore Demark has been supplied  with any insurance information on record for billing. [employer] offers a sliding scale Patient Assistance Program for patients without insurance, or whose  insurance does not completely cover the cost of the ZIO patch monitor. You must apply for the  Patient Assistance Program to qualify for the discounted rate. To apply, call [employer] at [phone],  select option 4, select option 2 , ask to apply for the Patient Assistance Program, (you can request an  interpreter if needed). [employer] will ask your household income and how many people are in your  household. [employer] will quote your out-of-pocket cost based on this information. They will also be able  to set up a 12 month interest free payment plan if needed.  Applying the monitor   Shave hair from upper left chest.  Hold the abrader disc by orange tab. Rub the abrader in 40 strokes over left upper chest as indicated in  your monitor instructions.  Clean area with 4 enclosed alcohol pads. Use all pads to ensure the area is cleaned thoroughly. Let  dry.  Apply patch as indicated in monitor instructions. Patch will be placed under collarbone on left side of  chest with arrow pointing upward.  Rub patch adhesive wings for 2 minutes. Remove the white label marked "1". Remove the white label  marked "2". Rub patch adhesive wings for 2 additional minutes.  While looking in a mirror, press and release button in center of patch. A small green light will flash 3-4  times. This will be your only indicator that  the monitor has been turned on.  Do not shower for the first 24 hours. You may shower after the first 24 hours.  Press the button if you feel a symptom. You will hear a small click. Record Date, Time and Symptom in  the Patient Log.   Starting the Gateway  In your kit there is a Hydrographic surveyor box the size of a cellphone. This is Airline pilot. It transmits all your  recorded data to [facility]. This box must always stay within 10 feet of you.  Open the box and push the *  button. There will be a light that blinks orange and then green a few times. When the light stops  blinking, the Gateway is connected to the ZIO patch. Call [employer] at [phone] to confirm your monitor is transmitting.  Returning your monitor  Remove your patch and place it inside the [place]. In the lower half of the Gateway there is a white  bag with prepaid postage on it. Place Gateway in bag and seal. Mail package back to [place] as soon as  possible. Your physician should have your final report approximately 7 days after you have mailed back  your monitor. Call [place] at [phone] if you have questions regarding your ZIO AT  patch monitor. Call them immediately if you see an orange light blinking on your monitor.  If your monitor falls off in less than 4 days, contact our Monitor department at [phone]. If your  monitor becomes loose or falls off after 4 days call [employer] at [phone] for suggestions on  securing your monitor    Follow-Up: At [facility], you and your health needs are our priority.  As part of our continuing mission to provide you with exceptional heart care, we have created designated Provider Care Teams.  These Care Teams include your primary Cardiologist (physician) and Advanced Practice Providers (APPs -  Physician Assistants and Nurse Practitioners) who all work together to provide you with the care you need, when you need it.  We recommend signing up for the patient portal called "MyChart".  Sign up information is provided on this After Visit Summary.  MyChart is used to connect with patients for Virtual Visits (Telemedicine).  Patients are able to view lab/test results, encounter notes, upcoming appointments, etc.  Non-urgent messages can be sent to your provider as well.   To learn more about what you can do with MyChart, go to [email or web address].    Your next appointment:   6  week(s)  The format for your next appointment:   In Person  Provider:   Almyra Deforest, PA-C

## 2021-11-21 ENCOUNTER — Encounter: Payer: Self-pay | Admitting: Physician Assistant

## 2021-11-29 ENCOUNTER — Ambulatory Visit (HOSPITAL_COMMUNITY)
Admission: RE | Admit: 2021-11-29 | Discharge: 2021-11-29 | Disposition: A | Discharge status: Home / Self Care | Payer: Medicare Other | Source: Ambulatory Visit | Attending: Physician Assistant | Admitting: Physician Assistant

## 2021-11-29 DIAGNOSIS — R55 Syncope and collapse: Secondary | ICD-10-CM

## 2021-11-29 LAB — ECHOCARDIOGRAM COMPLETE
Area-P 1/2: 3.65 cm2
S' Lateral: 3.4 cm

## 2021-11-29 NOTE — Progress Notes (Signed)
*  PRELIMINARY RESULTS* Echocardiogram 2D Echocardiogram has been performed.  Brandon Lawrence 11/29/2021, 2:49 PM

## 2021-12-03 ENCOUNTER — Ambulatory Visit: Payer: Medicare Other | Admitting: Internal Medicine

## 2021-12-03 DIAGNOSIS — R55 Syncope and collapse: Secondary | ICD-10-CM

## 2021-12-04 DIAGNOSIS — R55 Syncope and collapse: Secondary | ICD-10-CM | POA: Diagnosis not present

## 2021-12-06 DIAGNOSIS — D638 Anemia in other chronic diseases classified elsewhere: Secondary | ICD-10-CM | POA: Diagnosis not present

## 2021-12-06 DIAGNOSIS — N189 Chronic kidney disease, unspecified: Secondary | ICD-10-CM | POA: Diagnosis not present

## 2021-12-06 DIAGNOSIS — E1129 Type 2 diabetes mellitus with other diabetic kidney complication: Secondary | ICD-10-CM | POA: Diagnosis not present

## 2021-12-06 DIAGNOSIS — R809 Proteinuria, unspecified: Secondary | ICD-10-CM | POA: Diagnosis not present

## 2021-12-06 DIAGNOSIS — Z905 Acquired absence of kidney: Secondary | ICD-10-CM | POA: Diagnosis not present

## 2021-12-06 DIAGNOSIS — I129 Hypertensive chronic kidney disease with stage 1 through stage 4 chronic kidney disease, or unspecified chronic kidney disease: Secondary | ICD-10-CM | POA: Diagnosis not present

## 2021-12-06 DIAGNOSIS — E1122 Type 2 diabetes mellitus with diabetic chronic kidney disease: Secondary | ICD-10-CM | POA: Diagnosis not present

## 2021-12-24 DIAGNOSIS — N189 Chronic kidney disease, unspecified: Secondary | ICD-10-CM | POA: Diagnosis not present

## 2021-12-24 DIAGNOSIS — E1129 Type 2 diabetes mellitus with other diabetic kidney complication: Secondary | ICD-10-CM | POA: Diagnosis not present

## 2021-12-24 DIAGNOSIS — E1122 Type 2 diabetes mellitus with diabetic chronic kidney disease: Secondary | ICD-10-CM | POA: Diagnosis not present

## 2021-12-24 DIAGNOSIS — I129 Hypertensive chronic kidney disease with stage 1 through stage 4 chronic kidney disease, or unspecified chronic kidney disease: Secondary | ICD-10-CM | POA: Diagnosis not present

## 2021-12-24 DIAGNOSIS — R809 Proteinuria, unspecified: Secondary | ICD-10-CM | POA: Diagnosis not present

## 2021-12-31 ENCOUNTER — Ambulatory Visit: Payer: Medicare Other | Attending: Physician Assistant | Admitting: Physician Assistant

## 2021-12-31 ENCOUNTER — Encounter: Payer: Self-pay | Admitting: Physician Assistant

## 2021-12-31 VITALS — BP 130/70 | HR 54 | Ht 65.0 in | Wt 151.8 lb

## 2021-12-31 DIAGNOSIS — I10 Essential (primary) hypertension: Secondary | ICD-10-CM | POA: Diagnosis not present

## 2021-12-31 DIAGNOSIS — R42 Dizziness and giddiness: Secondary | ICD-10-CM

## 2021-12-31 DIAGNOSIS — I2510 Atherosclerotic heart disease of native coronary artery without angina pectoris: Secondary | ICD-10-CM | POA: Diagnosis not present
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
## 2021-12-31 NOTE — Patient Instructions (Signed)
Medication Instructions:  Your physician recommends that you continue on your current medications as directed. Please refer to the Current Medication list given to you today.  *If you need a refill on your cardiac medications before your next appointment, please call your pharmacy*   Lab Work: NONE If you have labs (blood work) drawn today and your tests are completely normal, you will receive your results only by: [place] (if you have MyChart) OR A paper copy in the mail If you have any lab test that is abnormal or we need to change your treatment, we will call you to review the results.   Testing/Procedures: NONE   Follow-Up: At [facility], you and your health needs are our priority.  As part of our continuing mission to provide you with exceptional heart care, we have created designated Provider Care Teams.  These Care Teams include your primary Cardiologist (physician) and Advanced Practice Providers (APPs -  Physician Assistants and Nurse Practitioners) who all work together to provide you with the care you need, when you need it.  We recommend signing up for the patient portal called "MyChart".  Sign up information is provided on this After Visit Summary.  MyChart is used to connect with patients for Virtual Visits (Telemedicine).  Patients are able to view lab/test results, encounter notes, upcoming appointments, etc.  Non-urgent messages can be sent to your provider as well.   To learn more about what you can do with MyChart, go to [email or web address].    Your next appointment:   6 month(s)  The format for your next appointment:   In Person  Provider:   Werner Lean, MD

## 2022-01-02 ENCOUNTER — Encounter: Payer: Self-pay | Admitting: Physician Assistant

## 2022-01-14 ENCOUNTER — Other Ambulatory Visit: Payer: Self-pay | Admitting: Family Medicine

## 2022-01-14 DIAGNOSIS — I10 Essential (primary) hypertension: Secondary | ICD-10-CM

## 2022-01-22 ENCOUNTER — Ambulatory Visit (INDEPENDENT_AMBULATORY_CARE_PROVIDER_SITE_OTHER): Payer: Medicare Other | Admitting: Family Medicine

## 2022-01-22 DIAGNOSIS — R1310 Dysphagia, unspecified: Secondary | ICD-10-CM

## 2022-01-22 DIAGNOSIS — K219 Gastro-esophageal reflux disease without esophagitis: Secondary | ICD-10-CM | POA: Diagnosis not present

## 2022-01-22 MED ORDER — SUCRALFATE 1 G PO TABS: 1.0000 g | ORAL_TABLET | Freq: Every day | ORAL | 11 refills | Status: DC

## 2022-01-22 MED ORDER — SILDENAFIL CITRATE 100 MG PO TABS: 50.0000 mg | ORAL_TABLET | Freq: Every day | ORAL | 11 refills | Status: DC | PRN
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
## 2022-02-04 ENCOUNTER — Other Ambulatory Visit: Payer: Self-pay | Admitting: Family Medicine

## 2022-02-08 DIAGNOSIS — I129 Hypertensive chronic kidney disease with stage 1 through stage 4 chronic kidney disease, or unspecified chronic kidney disease: Secondary | ICD-10-CM | POA: Diagnosis not present

## 2022-02-08 DIAGNOSIS — R809 Proteinuria, unspecified: Secondary | ICD-10-CM | POA: Diagnosis not present

## 2022-02-08 DIAGNOSIS — N189 Chronic kidney disease, unspecified: Secondary | ICD-10-CM | POA: Diagnosis not present

## 2022-02-08 DIAGNOSIS — E1129 Type 2 diabetes mellitus with other diabetic kidney complication: Secondary | ICD-10-CM | POA: Diagnosis not present

## 2022-02-08 DIAGNOSIS — E1122 Type 2 diabetes mellitus with diabetic chronic kidney disease: Secondary | ICD-10-CM | POA: Diagnosis not present

## 2022-02-14 DIAGNOSIS — I129 Hypertensive chronic kidney disease with stage 1 through stage 4 chronic kidney disease, or unspecified chronic kidney disease: Secondary | ICD-10-CM | POA: Diagnosis not present

## 2022-02-14 DIAGNOSIS — N189 Chronic kidney disease, unspecified: Secondary | ICD-10-CM | POA: Diagnosis not present

## 2022-02-14 DIAGNOSIS — Z905 Acquired absence of kidney: Secondary | ICD-10-CM | POA: Diagnosis not present

## 2022-02-14 DIAGNOSIS — R809 Proteinuria, unspecified: Secondary | ICD-10-CM | POA: Diagnosis not present

## 2022-02-14 DIAGNOSIS — D638 Anemia in other chronic diseases classified elsewhere: Secondary | ICD-10-CM | POA: Diagnosis not present

## 2022-02-14 DIAGNOSIS — E1122 Type 2 diabetes mellitus with diabetic chronic kidney disease: Secondary | ICD-10-CM | POA: Diagnosis not present

## 2022-02-14 DIAGNOSIS — E1129 Type 2 diabetes mellitus with other diabetic kidney complication: Secondary | ICD-10-CM | POA: Diagnosis not present

## 2022-02-26 ENCOUNTER — Ambulatory Visit (INDEPENDENT_AMBULATORY_CARE_PROVIDER_SITE_OTHER): Payer: Medicare Other | Admitting: Family Medicine

## 2022-02-26 ENCOUNTER — Encounter: Payer: Self-pay | Admitting: Family Medicine

## 2022-02-26 VITALS — BP 122/62 | HR 63 | Temp 98.0°F | Ht 65.0 in | Wt 154.0 lb

## 2022-02-26 DIAGNOSIS — Z0000 Encounter for general adult medical examination without abnormal findings: Secondary | ICD-10-CM | POA: Diagnosis not present

## 2022-02-26 DIAGNOSIS — Z23 Encounter for immunization: Secondary | ICD-10-CM | POA: Diagnosis not present
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
## 2022-03-06 ENCOUNTER — Encounter: Payer: Self-pay | Admitting: *Deleted

## 2022-03-07 DIAGNOSIS — H35031 Hypertensive retinopathy, right eye: Secondary | ICD-10-CM | POA: Diagnosis not present

## 2022-03-07 DIAGNOSIS — H31011 Macula scars of posterior pole (postinflammatory) (post-traumatic), right eye: Secondary | ICD-10-CM | POA: Diagnosis not present

## 2022-03-07 DIAGNOSIS — H2513 Age-related nuclear cataract, bilateral: Secondary | ICD-10-CM | POA: Diagnosis not present

## 2022-03-07 DIAGNOSIS — H04123 Dry eye syndrome of bilateral lacrimal glands: Secondary | ICD-10-CM | POA: Diagnosis not present

## 2022-03-07 DIAGNOSIS — H524 Presbyopia: Secondary | ICD-10-CM | POA: Diagnosis not present

## 2022-03-07 LAB — HM DIABETES EYE EXAM

## 2022-03-08 ENCOUNTER — Other Ambulatory Visit (INDEPENDENT_AMBULATORY_CARE_PROVIDER_SITE_OTHER): Payer: Medicare Other

## 2022-03-08 DIAGNOSIS — Z23 Encounter for immunization: Secondary | ICD-10-CM

## 2022-03-26 ENCOUNTER — Ambulatory Visit (INDEPENDENT_AMBULATORY_CARE_PROVIDER_SITE_OTHER): Payer: Medicare Other | Admitting: Family Medicine

## 2022-03-26 ENCOUNTER — Encounter: Payer: Self-pay | Admitting: Family Medicine

## 2022-03-26 VITALS — BP 128/72 | HR 61 | Temp 98.4°F | Ht 65.0 in | Wt 147.0 lb

## 2022-03-26 DIAGNOSIS — J069 Acute upper respiratory infection, unspecified: Secondary | ICD-10-CM | POA: Diagnosis not present

## 2022-03-26 NOTE — Assessment & Plan Note (Signed)
Reassured patient that symptoms and exam findings are most consistent with a viral upper respiratory infection and explained lack of efficacy of antibiotics against viruses.  Discussed expected course and features suggestive of secondary bacterial infection.  Continue supportive care. Increase fluid intake with water or electrolyte solution like pedialyte. Encouraged salt water gargling, chloraseptic spray and throat lozenges. Encouraged OTC guaifenesin. Encouraged saline sinus flushes and/or neti with humidified air.

## 2022-03-26 NOTE — Progress Notes (Signed)
   Acute Office Visit  Subjective:     Patient ID: Brandon Lawrence, male    DOB: June 05, 1960, 61 y.o.   MRN: [id number]  Chief Complaint  Patient presents with   Follow-up    nasal congestion at night since Saturday (difficulty sleeping)    HPI Patient is in today for nasal congestion at night since Friday. Associated with a headache, cough, sinus pressure, clear rhinorrhea, and sore throat. Denies fever, night sweats, chills, shortness of breath, wheezing. Unsure of sick exposures. Has tried Flonase without relief and cough/congestion pills. Negative Covid Friday.  Review of Systems  Reason unable to perform ROS: see HPI.  All other systems reviewed and are negative.       Objective:    BP 128/72   Pulse 61   Temp 98.4 F (36.9 C) (Oral)   Ht '5\' 5"'$  (1.651 m)   Wt 147 lb (66.7 kg)   SpO2 98%   BMI 24.46 kg/m    Physical Exam Vitals and nursing note reviewed.  Constitutional:      Appearance: Normal appearance. He is normal weight.  HENT:     Head: Normocephalic and atraumatic.     Right Ear: Tympanic membrane, ear canal and external ear normal.     Left Ear: Tympanic membrane, ear canal and external ear normal.     Nose: Nose normal.     Mouth/Throat:     Mouth: Mucous membranes are moist.     Pharynx: Oropharynx is clear.  Eyes:     Conjunctiva/sclera: Conjunctivae normal.  Cardiovascular:     Rate and Rhythm: Normal rate and regular rhythm.     Pulses: Normal pulses.     Heart sounds: Normal heart sounds.  Pulmonary:     Effort: Pulmonary effort is normal.     Breath sounds: Normal breath sounds.  Skin:    General: Skin is warm and dry.     Capillary Refill: Capillary refill takes less than 2 seconds.  Neurological:     General: No focal deficit present.     Mental Status: He is alert and oriented to person, place, and time. Mental status is at baseline.  Psychiatric:        Mood and Affect: Mood normal.        Behavior: Behavior normal.         Thought Content: Thought content normal.        Judgment: Judgment normal.     No results found for any visits on 03/26/22.      Assessment & Plan:   Viral URI with cough Assessment & Plan: Reassured patient that symptoms and exam findings are most consistent with a viral upper respiratory infection and explained lack of efficacy of antibiotics against viruses.  Discussed expected course and features suggestive of secondary bacterial infection.  Continue supportive care. Increase fluid intake with water or electrolyte solution like pedialyte. Encouraged salt water gargling, chloraseptic spray and throat lozenges. Encouraged OTC guaifenesin. Encouraged saline sinus flushes and/or neti with humidified air.    Orders: -     SARS-CoV-2 RNA, Influenza A/B, and RSV RNA, Qualitative NAAT      No orders of the defined types were placed in this encounter.   Return if symptoms worsen or fail to improve.  Rubie Maid, FNP

## 2022-03-27 ENCOUNTER — Telehealth: Payer: Self-pay | Admitting: Family Medicine

## 2022-03-27 LAB — SARS-COV-2 RNA, INFLUENZA A/B, AND RSV RNA, QUALITATIVE NAAT
INFLUENZA A RNA: NOT DETECTED
INFLUENZA B RNA: NOT DETECTED
RSV RNA: NOT DETECTED
SARS COV2 RNA: DETECTED — AB

## 2022-03-27 MED ORDER — MOLNUPIRAVIR EUA 200MG CAPSULE: 4.0000 | ORAL_CAPSULE | Freq: Two times a day (BID) | ORAL | 0 refills | Status: AC

## 2022-03-27 NOTE — Telephone Encounter (Signed)
Discussed positive Covid test results with patient and isolation recommendations. Will treat with Molnupiravir '800mg'$  BID for 5 days.

## 2022-04-12 ENCOUNTER — Ambulatory Visit: Payer: Medicare Other | Admitting: Gastroenterology

## 2022-04-12 ENCOUNTER — Encounter: Payer: Self-pay | Admitting: Gastroenterology

## 2022-04-12 VITALS — BP 136/67 | HR 92 | Temp 98.4°F | Ht 65.0 in | Wt 158.6 lb

## 2022-04-12 DIAGNOSIS — R1013 Epigastric pain: Secondary | ICD-10-CM

## 2022-04-12 DIAGNOSIS — K5904 Chronic idiopathic constipation: Secondary | ICD-10-CM | POA: Diagnosis not present

## 2022-04-12 DIAGNOSIS — K219 Gastro-esophageal reflux disease without esophagitis: Secondary | ICD-10-CM | POA: Diagnosis not present

## 2022-04-12 MED ORDER — FAMOTIDINE 20 MG PO TABS: 20.0000 mg | ORAL_TABLET | Freq: Every day | ORAL | 5 refills | Status: DC

## 2022-04-12 NOTE — Progress Notes (Signed)
GI Office Note    Referring Provider: Susy Frizzle, MD Primary Care Physician:  Susy Frizzle, MD  Primary Gastroenterologist: Garfield Cornea, MD   Chief Complaint   Chief Complaint  Patient presents with   Follow-up    States that he still has issues with some abd pain.    History of Present Illness   Brandon Lawrence is a 61 y.o. male presenting today for follow up GERD, constipation, dyspepsia. H/O IDA, with resolution of iron deficiency but persistent normocytic anemia likely anemia of chronic diseae in setting of CKD.   Overall doing well. His heartburn is well controlled during the day. He states he wakes up every morning around 5am with burning in the upper abdomen. Usually lasts for about 5-10 minutes and goes away. Otherwise no abdominal pain. No melena, brbpr. Has 3-4 loose stools every day on Linzess 50mg daily.  02/08/22: H/H 12/35.9, MCV 90. Cre 1.55, eGFR 51.  Colonoscopy 2022: normal. Repeat 5 years. EGD 2022: normal esophagus s/p empiric dilation, normal stomach/duodenum      Medications   Current Outpatient Medications  Medication Sig Dispense Refill   amLODipine (NORVASC) 10 MG tablet Take 1 tablet (10 mg total) by mouth daily. 90 tablet 3   dexlansoprazole (DEXILANT) 60 MG capsule TAKE (1) CAPSULE BY MOUTH EVERY DAY. 90 capsule 3   diclofenac Sodium (VOLTAREN) 1 % GEL Apply 1 application topically 3 (three) times daily. 50 g 1   doxazosin (CARDURA) 4 MG tablet TAKE 1 TABLET BY MOUTH DAILY. OFFICE VISIT NEEDED FOR ADDITIONAL REFILLS. 90 tablet 0   DULoxetine (CYMBALTA) 30 MG capsule TAKE ONE CAPSULE BY MOUTH ONCE DAILY. 90 capsule 3   linaclotide (LINZESS) 72 MCG capsule TAKE 1 CAPSULE(72 MCG) BY MOUTH DAILY BEFORE BREAKFAST 90 capsule 1   mirtazapine (REMERON) 7.5 MG tablet Take 1 tablet (7.5 mg total) by mouth at bedtime. 90 tablet 1   sildenafil (VIAGRA) 100 MG tablet Take 0.5-1 tablets (50-100 mg total) by mouth daily as needed for  erectile dysfunction. 5 tablet 11   valsartan (DIOVAN) 40 MG tablet Take 40 mg by mouth daily. 1/2 tablet daily     No current facility-administered medications for this visit.    Allergies   Allergies as of 04/12/2022 - Review Complete 04/12/2022  Allergen Reaction Noted   Aspirin Other (See Comments) 09/21/2007   Nsaids Other (See Comments) 07/28/2020       Review of Systems   General: Negative for anorexia, weight loss, fever, chills, fatigue, weakness. ENT: Negative for hoarseness, difficulty swallowing , nasal congestion. CV: Negative for chest pain, angina, palpitations, dyspnea on exertion, peripheral edema.  Respiratory: Negative for dyspnea at rest, dyspnea on exertion, cough, sputum, wheezing.  GI: See history of present illness. GU:  Negative for dysuria, hematuria, urinary incontinence, urinary frequency, nocturnal urination.  Endo: Negative for unusual weight change.     Physical Exam   BP 136/67 (BP Location: Right Arm, Patient Position: Sitting, Cuff Size: Normal)   Pulse 92   Temp 98.4 F (36.9 C) (Oral)   Ht _0  (1.651 m)   Wt 158 lb 9.6 oz (71.9 kg)   SpO2 94%   BMI 26.39 kg/m    General: Well-nourished, well-developed in no acute distress.  Eyes: No icterus. Mouth: Oropharyngeal mucosa moist and pink  Abdomen: Bowel sounds are normal, nontender, nondistended, no hepatosplenomegaly or masses,  no abdominal bruits or hernia , no rebound or guarding.  Rectal: not performed  Extremities: No lower extremity edema. No clubbing or deformities. Neuro: Alert and oriented x 4   Skin: Warm and dry, no jaundice.   Psych: Alert and cooperative, normal mood and affect.  Labs   See hpi Imaging Studies   No results found.  Assessment   GERD/dyspepsia: overall doing well but waking up at 5am every morning with burning in the epigastric region lasting for 5 minutes or so. May be having some breakthrough reflux.   Constipation: bowels moving daily. He is  having 3-4 loose/watery stools and was inquiring about dose reduction of Linzess   PLAN   Continue Dexilant 22m daily. Add Pepcid 230mat bedtime. Try taking Linzess 7216mevery other day. If continues to have frequent loose stools, he can try opening capsule and taking 1/2 contents each day. Other options, of switching to Amitiza or Trulance.  Return to the office in six months or sooner if needed.    LesLaureen OchsewBobby RumpfHSNew CentervilleA-Glendorastroenter[facility] Associates

## 2022-04-12 NOTE — Patient Instructions (Signed)
Continue Dexilant '60mg'$  daily before breakfast for acid reflux. Add Pepcid '20mg'$  at bedtime to see if this helps you with early morning burning abdominal pain. For constipation, try taking Linzess every other day to see if this cuts down on your loose/watery stools. If not, you can open the capsule and poor half the contents into applesauce, yogurt, or few teaspoons of water and take daily.  Return to the office in six months or call sooner if needed.

## 2022-04-14 DIAGNOSIS — R519 Headache, unspecified: Secondary | ICD-10-CM | POA: Diagnosis not present

## 2022-04-14 DIAGNOSIS — Z20822 Contact with and (suspected) exposure to covid-19: Secondary | ICD-10-CM | POA: Diagnosis not present

## 2022-04-14 DIAGNOSIS — J0140 Acute pansinusitis, unspecified: Secondary | ICD-10-CM | POA: Diagnosis not present

## 2022-04-15 ENCOUNTER — Other Ambulatory Visit: Payer: Self-pay | Admitting: Family Medicine

## 2022-04-15 DIAGNOSIS — I10 Essential (primary) hypertension: Secondary | ICD-10-CM

## 2022-04-23 ENCOUNTER — Ambulatory Visit (INDEPENDENT_AMBULATORY_CARE_PROVIDER_SITE_OTHER): Payer: Medicare Other | Admitting: Family Medicine

## 2022-04-23 VITALS — BP 130/70 | HR 62 | Ht 65.0 in | Wt 158.0 lb

## 2022-04-23 DIAGNOSIS — N1832 Chronic kidney disease, stage 3b: Secondary | ICD-10-CM | POA: Diagnosis not present

## 2022-04-23 DIAGNOSIS — I10 Essential (primary) hypertension: Secondary | ICD-10-CM
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
## 2022-04-23 NOTE — Progress Notes (Signed)
[facility] Quality Team Note  Name: Brandon Lawrence Date of Birth: 1960-08-20 MRN: [id number] Date: 04/23/2022  [facility] Quality Team has reviewed this patient's chart, please see recommendations below:  [facility] Quality Other; (HBD- HEMOGLOBIN A1C- PATIENT NEEDS A1C CHECKED BEFORE END OF YEAR FOR GAP CLOSURE. PATIENT HAS APPT TODAY 12/19. CAN TEST BE COMPLETED DURING OFFICE VISIT?)

## 2022-04-24 LAB — COMPLETE METABOLIC PANEL WITH GFR
AG Ratio: 1.2 (calc) (ref 1.0–2.5)
ALT: 20 U/L (ref 9–46)
AST: 21 U/L (ref 10–35)
Albumin: 4 g/dL (ref 3.6–5.1)
Alkaline phosphatase (APISO): 41 U/L (ref 35–144)
BUN/Creatinine Ratio: 17 (calc) (ref 6–22)
BUN: 25 mg/dL (ref 7–25)
CO2: 27 mmol/L (ref 20–32)
Calcium: 9.2 mg/dL (ref 8.6–10.3)
Chloride: 105 mmol/L (ref 98–110)
Creat: 1.49 mg/dL — ABNORMAL HIGH (ref 0.70–1.35)
Globulin: 3.3 g/dL (calc) (ref 1.9–3.7)
Glucose, Bld: 114 mg/dL — ABNORMAL HIGH (ref 65–99)
Potassium: 4.5 mmol/L (ref 3.5–5.3)
Sodium: 138 mmol/L (ref 135–146)
Total Bilirubin: 0.3 mg/dL (ref 0.2–1.2)
Total Protein: 7.3 g/dL (ref 6.1–8.1)
eGFR: 53 mL/min/{1.73_m2} — ABNORMAL LOW (ref >=60)

## 2022-04-24 LAB — CBC WITH DIFFERENTIAL/PLATELET
Absolute Monocytes: 639 cells/uL (ref 200–950)
Basophils Absolute: 27 cells/uL (ref 0–200)
Basophils Relative: 0.4 %
Eosinophils Absolute: 340 cells/uL (ref 15–500)
Eosinophils Relative: 5 %
HCT: 35.8 % — ABNORMAL LOW (ref 38.5–50.0)
Hemoglobin: 12.1 g/dL — ABNORMAL LOW (ref 13.2–17.1)
Lymphs Abs: 3026 cells/uL (ref 850–3900)
MCH: 30.3 pg (ref 27.0–33.0)
MCHC: 33.8 g/dL (ref 32.0–36.0)
MCV: 89.5 fL (ref 80.0–100.0)
MPV: 9.3 fL (ref 7.5–12.5)
Monocytes Relative: 9.4 %
Neutro Abs: 2768 cells/uL (ref 1500–7800)
Neutrophils Relative %: 40.7 %
Platelets: 331 10*3/uL (ref 140–400)
RBC: 4 10*6/uL — ABNORMAL LOW (ref 4.20–5.80)
RDW: 13.6 % (ref 11.0–15.0)
Total Lymphocyte: 44.5 %
WBC: 6.8 10*3/uL (ref 3.8–10.8)

## 2022-04-24 LAB — LIPID PANEL
Cholesterol: 162 mg/dL (ref <200)
HDL: 40 mg/dL (ref >=40)
LDL Cholesterol (Calc): 106 mg/dL (calc) — ABNORMAL HIGH
Non-HDL Cholesterol (Calc): 122 mg/dL (calc) (ref <130)
Total CHOL/HDL Ratio: 4.1 (calc) (ref <5.0)
Triglycerides: 71 mg/dL (ref <150)

## 2022-04-24 LAB — PROTEIN / CREATININE RATIO, URINE
Creatinine, Urine: 160 mg/dL (ref 20–320)
Protein/Creat Ratio: 100 mg/g creat (ref 25–148)
Protein/Creatinine Ratio: 0.1 mg/mg creat (ref 0.025–0.148)
Total Protein, Urine: 16 mg/dL (ref 5–25)

## 2022-05-21 ENCOUNTER — Ambulatory Visit: Payer: Medicare Other
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
## 2022-05-21 NOTE — Patient Instructions (Signed)
Mr. Brandon Lawrence , Thank you for taking time to come for your Medicare Wellness Visit. I appreciate your ongoing commitment to your health goals. Please review the following plan we discussed and let me know if I can assist you in the future.   These are the goals we discussed:  Goals      Absence of Fall and Fall-Related Injury     Evidence-based guidance:  Assess fall risk using a validated tool when available. Consider balance and gait impairment, muscle weakness, diminished vision or hearing, environmental hazards, presence of urinary or bowel urgency and/or incontinence.  Communicate fall injury risk to interprofessional healthcare team.  Develop a fall prevention plan with the patient and family.  Promote use of personal vision and auditory aids.  Promote reorientation, appropriate sensory stimulation, and routines to decrease risk of fall when changes in mental status are present.  Assess assistance level required for safe and effective self-care; consider referral for home care.  Encourage physical activity, such as performance of self-care at highest level of ability, strength and balance exercise program, and provision of appropriate assistive devices; refer to rehabilitation therapy.  Refer to community-based fall prevention program where available.  If fall occurs, determine the cause and revise fall injury prevention plan.  Regularly review medication contribution to fall risk; consider risk related to polypharmacy and age.  Refer to pharmacist for consultation when concerns about medications are revealed.  Balance adequate pain management with potential for oversedation.  Provide guidance related to environmental modifications.  Consider supplementation with Vitamin D.   Notes:         This is a list of the screening recommended for you and due dates:  Health Maintenance  Topic Date Due   COVID-19 Vaccine (3 - Moderna risk series) 05/29/2022*   Zoster (Shingles) Vaccine (1 of  2) 05/29/2022*   Medicare Annual Wellness Visit  02/27/2023   Yearly kidney function blood test for diabetes  04/24/2023   Yearly kidney health urinalysis for diabetes  04/24/2023   Colon Cancer Screening  08/15/2030   DTaP/Tdap/Td vaccine (3 - Td or Tdap) 02/27/2032   Flu Shot  Completed   Hepatitis C Screening: USPSTF Recommendation to screen - Ages 18-79 yo.  Completed   HIV Screening  Completed   HPV Vaccine  Aged Out  *Topic was postponed. The date shown is not the original due date.    Advanced directives: ***  Conditions/risks identified: Aim for 30 minutes of exercise or brisk walking, 6-8 glasses of water, and 5 servings of fruits and vegetables each day.   Next appointment: Follow up in one year for your annual wellness visit   Preventive Care 40-64 Years, Male Preventive care refers to lifestyle choices and visits with your health care provider that can promote health and wellness. What does preventive care include? A yearly physical exam. This is also called an annual well check. Dental exams once or twice a year. Routine eye exams. Ask your health care provider how often you should have your eyes checked. Personal lifestyle choices, including: Daily care of your teeth and gums. Regular physical activity. Eating a healthy diet. Avoiding tobacco and drug use. Limiting alcohol use. Practicing safe sex. Taking low-dose aspirin every day starting at age 20. What happens during an annual well check? The services and screenings done by your health care provider during your annual well check will depend on your age, overall health, lifestyle risk factors, and family history of disease. Counseling  Your health care  provider may ask you questions about your: Alcohol use. Tobacco use. Drug use. Emotional well-being. Home and relationship well-being. Sexual activity. Eating habits. Work and work Statistician. Screening  You may have the following tests or  measurements: Height, weight, and BMI. Blood pressure. Lipid and cholesterol levels. These may be checked every 5 years, or more frequently if you are over 66 years old. Skin check. Lung cancer screening. You may have this screening every year starting at age 50 if you have a 30-pack-year history of smoking and currently smoke or have quit within the past 15 years. Fecal occult blood test (FOBT) of the stool. You may have this test every year starting at age 53. Flexible sigmoidoscopy or colonoscopy. You may have a sigmoidoscopy every 5 years or a colonoscopy every 10 years starting at age 76. Prostate cancer screening. Recommendations will vary depending on your family history and other risks. Hepatitis C blood test. Hepatitis B blood test. Sexually transmitted disease (STD) testing. Diabetes screening. This is done by checking your blood sugar (glucose) after you have not eaten for a while (fasting). You may have this done every 1-3 years. Discuss your test results, treatment options, and if necessary, the need for more tests with your health care provider. Vaccines  Your health care provider may recommend certain vaccines, such as: Influenza vaccine. This is recommended every year. Tetanus, diphtheria, and acellular pertussis (Tdap, Td) vaccine. You may need a Td booster every 10 years. Zoster vaccine. You may need this after age 3. Pneumococcal 13-valent conjugate (PCV13) vaccine. You may need this if you have certain conditions and have not been vaccinated. Pneumococcal polysaccharide (PPSV23) vaccine. You may need one or two doses if you smoke cigarettes or if you have certain conditions. Talk to your health care provider about which screenings and vaccines you need and how often you need them. This information is not intended to replace advice given to you by your health care provider. Make sure you discuss any questions you have with your health care provider. Document Released:  05/19/2015 Document Revised: 01/10/2016 Document Reviewed: 02/21/2015 Elsevier Interactive Patient Education  2017 [place] Prevention in the Home Falls can cause injuries. They can happen to people of all ages. There are many things you can do to make your home safe and to help prevent falls. What can I do on the outside of my home? Regularly fix the edges of walkways and driveways and fix any cracks. Remove anything that might make you trip as you walk through a door, such as a raised step or threshold. Trim any bushes or trees on the path to your home. Use bright outdoor lighting. Clear any walking paths of anything that might make someone trip, such as rocks or tools. Regularly check to see if handrails are loose or broken. Make sure that both sides of any steps have handrails. Any raised decks and porches should have guardrails on the edges. Have any leaves, snow, or ice cleared regularly. Use sand or salt on walking paths during winter. Clean up any spills in your garage right away. This includes oil or grease spills. What can I do in the bathroom? Use night lights. Install grab bars by the toilet and in the tub and shower. Do not use towel bars as grab bars. Use non-skid mats or decals in the tub or shower. If you need to sit down in the shower, use a plastic, non-slip stool. Keep the floor dry. Clean up any water that  spills on the floor as soon as it happens. Remove soap buildup in the tub or shower regularly. Attach bath mats securely with double-sided non-slip rug tape. Do not have throw rugs and other things on the floor that can make you trip. What can I do in the bedroom? Use night lights. Make sure that you have a light by your bed that is easy to reach. Do not use any sheets or blankets that are too big for your bed. They should not hang down onto the floor. Have a firm chair that has side arms. You can use this for support while you get dressed. Do not have  throw rugs and other things on the floor that can make you trip. What can I do in the kitchen? Clean up any spills right away. Avoid walking on wet floors. Keep items that you use a lot in easy-to-reach places. If you need to reach something above you, use a strong step stool that has a grab bar. Keep electrical cords out of the way. Do not use floor polish or wax that makes floors slippery. If you must use wax, use non-skid floor wax. Do not have throw rugs and other things on the floor that can make you trip. What can I do with my stairs? Do not leave any items on the stairs. Make sure that there are handrails on both sides of the stairs and use them. Fix handrails that are broken or loose. Make sure that handrails are as long as the stairways. Check any carpeting to make sure that it is firmly attached to the stairs. Fix any carpet that is loose or worn. Avoid having throw rugs at the top or bottom of the stairs. If you do have throw rugs, attach them to the floor with carpet tape. Make sure that you have a light switch at the top of the stairs and the bottom of the stairs. If you do not have them, ask someone to add them for you. What else can I do to help prevent falls? Wear shoes that: Do not have high heels. Have rubber bottoms. Are comfortable and fit you well. Are closed at the toe. Do not wear sandals. If you use a stepladder: Make sure that it is fully opened. Do not climb a closed stepladder. Make sure that both sides of the stepladder are locked into place. Ask someone to hold it for you, if possible. Clearly mark and make sure that you can see: Any grab bars or handrails. First and last steps. Where the edge of each step is. Use tools that help you move around (mobility aids) if they are needed. These include: Canes. Walkers. Scooters. Crutches. Turn on the lights when you go into a dark area. Replace any light bulbs as soon as they burn out. Set up your furniture so  you have a clear path. Avoid moving your furniture around. If any of your floors are uneven, fix them. If there are any pets around you, be aware of where they are. Review your medicines with your doctor. Some medicines can make you feel dizzy. This can increase your chance of falling. Ask your doctor what other things that you can do to help prevent falls. This information is not intended to replace advice given to you by your health care provider. Make sure you discuss any questions you have with your health care provider. Document Released: 02/16/2009 Document Revised: 09/28/2015 Document Reviewed: 05/27/2014 Elsevier Interactive Patient Education  2017 [employer].

## 2022-05-22 ENCOUNTER — Encounter: Payer: Self-pay | Admitting: Family Medicine

## 2022-06-13 NOTE — Progress Notes (Signed)
Error patient not seen

## 2022-06-24 ENCOUNTER — Ambulatory Visit (INDEPENDENT_AMBULATORY_CARE_PROVIDER_SITE_OTHER): Payer: Medicare Other | Admitting: Family Medicine

## 2022-06-24 ENCOUNTER — Encounter: Payer: Self-pay | Admitting: Family Medicine

## 2022-06-24 VITALS — BP 168/82 | HR 68 | Temp 98.2°F | Ht 65.0 in | Wt 164.0 lb

## 2022-06-24 DIAGNOSIS — T485X5A Adverse effect of other anti-common-cold drugs, initial encounter: Secondary | ICD-10-CM | POA: Diagnosis not present

## 2022-06-24 DIAGNOSIS — J310 Chronic rhinitis: Secondary | ICD-10-CM

## 2022-06-24 MED ORDER — FLUTICASONE PROPIONATE 50 MCG/ACT NA SUSP: 2.0000 | Freq: Every day | NASAL | 6 refills | Status: DC

## 2022-06-24 MED ORDER — PREDNISONE 20 MG PO TABS: ORAL_TABLET | ORAL | 0 refills | Status: DC
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
## 2022-07-01 ENCOUNTER — Encounter: Payer: Self-pay | Admitting: Family Medicine

## 2022-07-01 ENCOUNTER — Ambulatory Visit (INDEPENDENT_AMBULATORY_CARE_PROVIDER_SITE_OTHER): Payer: Medicare Other | Admitting: Family Medicine

## 2022-07-01 VITALS — BP 150/70 | HR 68 | Temp 98.9°F | Ht 65.0 in | Wt 160.0 lb

## 2022-07-01 DIAGNOSIS — I10 Essential (primary) hypertension: Secondary | ICD-10-CM | POA: Diagnosis not present

## 2022-07-01 DIAGNOSIS — N1832 Chronic kidney disease, stage 3b: Secondary | ICD-10-CM | POA: Diagnosis not present

## 2022-07-01 MED ORDER — HYDROCODONE-ACETAMINOPHEN 5-325 MG PO TABS: 1.0000 | ORAL_TABLET | Freq: Four times a day (QID) | ORAL | 0 refills | Status: DC | PRN

## 2022-07-01 MED ORDER — VALSARTAN 80 MG PO TABS: 80.0000 mg | ORAL_TABLET | Freq: Every day | ORAL | 3 refills | Status: DC
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
## 2022-07-04 DIAGNOSIS — R809 Proteinuria, unspecified: Secondary | ICD-10-CM | POA: Diagnosis not present

## 2022-07-04 DIAGNOSIS — N189 Chronic kidney disease, unspecified: Secondary | ICD-10-CM | POA: Diagnosis not present

## 2022-07-04 DIAGNOSIS — E1129 Type 2 diabetes mellitus with other diabetic kidney complication: Secondary | ICD-10-CM | POA: Diagnosis not present

## 2022-07-04 DIAGNOSIS — E1122 Type 2 diabetes mellitus with diabetic chronic kidney disease: Secondary | ICD-10-CM | POA: Diagnosis not present

## 2022-07-04 DIAGNOSIS — I129 Hypertensive chronic kidney disease with stage 1 through stage 4 chronic kidney disease, or unspecified chronic kidney disease: Secondary | ICD-10-CM | POA: Diagnosis not present

## 2022-07-15 ENCOUNTER — Other Ambulatory Visit: Payer: Self-pay | Admitting: Internal Medicine

## 2022-07-15 ENCOUNTER — Other Ambulatory Visit: Payer: Self-pay | Admitting: Family Medicine

## 2022-07-15 DIAGNOSIS — I10 Essential (primary) hypertension: Secondary | ICD-10-CM

## 2022-08-06 ENCOUNTER — Other Ambulatory Visit: Payer: Self-pay | Admitting: Nurse Practitioner

## 2022-08-06 DIAGNOSIS — G8929 Other chronic pain: Secondary | ICD-10-CM

## 2022-08-14 ENCOUNTER — Other Ambulatory Visit: Payer: Self-pay | Admitting: Internal Medicine

## 2022-08-29 ENCOUNTER — Other Ambulatory Visit: Payer: Self-pay | Admitting: *Deleted

## 2022-08-29 MED ORDER — AMLODIPINE BESYLATE 10 MG PO TABS: 10.0000 mg | ORAL_TABLET | Freq: Every day | ORAL | 0 refills | Status: DC

## 2022-09-03 ENCOUNTER — Telehealth: Payer: Self-pay

## 2022-09-03 MED ORDER — FAMOTIDINE 20 MG PO TABS: 20.0000 mg | ORAL_TABLET | Freq: Every day | ORAL | 5 refills | Status: DC

## 2022-09-03 NOTE — Addendum Note (Signed)
Addended by: Tiffany Kocher on: 09/03/2022 12:48 PM   Modules accepted: Orders

## 2022-09-03 NOTE — Telephone Encounter (Signed)
Refill request for Famotidine Tab was received from [facility]. Pt was last seen on 04/12/2022.

## 2022-09-04 ENCOUNTER — Other Ambulatory Visit: Payer: Self-pay

## 2022-09-04 DIAGNOSIS — G8929 Other chronic pain: Secondary | ICD-10-CM

## 2022-09-04 DIAGNOSIS — I10 Essential (primary) hypertension: Secondary | ICD-10-CM

## 2022-09-04 NOTE — Telephone Encounter (Signed)
Will not send prednisone request as this was for an acute issue, see OV 06/24/22. Will not send sulcrafate due to noted patient not taking and not on current medication list. Ibuprofen discontinued by provider on 07/01/22 OV due to CKD.

## 2022-09-04 NOTE — Telephone Encounter (Signed)
Prescription Request  09/04/2022  LOV: 06/24/22  What is the name of the medication or equipment? fluticasone (FLONASE) 50 MCG/ACT nasal spray [[id number]]   Have you contacted your pharmacy to request a refill? Yes   Which pharmacy would you like this sent to?  [facility] - [place], [place] - [address] [place] [address] Phone: [phone] Fax: [phone]    Patient notified that their request is being sent to the clinical staff for review and that they should receive a response within 2 business days.   Please advise at [facility] [phone]  Prescription Request  09/04/2022  LOV: 06/24/22  What is the name of the medication or equipment? dexlansoprazole (DEXILANT) 60 MG capsule  Have you contacted your pharmacy to request a refill? Yes   Which pharmacy would you like this sent to?  [facility] - [place], [place] - [address] [place] [address] Phone: [phone] Fax: [phone]    Patient notified that their request is being sent to the clinical staff for review and that they should receive a response within 2 business days.   Please advise at [facility] [phone]  Prescription Request  09/04/2022  LOV: 06/24/22  What is the name of the medication or equipment? DULoxetine (CYMBALTA) 30 MG capsule [[id number]]  Have you contacted your pharmacy to request a refill? Yes   Which pharmacy would you like this sent to?  [facility] Delivery - [place], [place] - [address] [place] [address] Phone: [phone] Fax: [phone]    Patient notified that their request is being sent to the clinical staff for review and that they should receive a response within 2 business days.   Please advise at [facility] [phone]  Prescription Request  09/04/2022  LOV: 06/24/22  What is the name of the medication or equipment?  sucralfate (CARAFATE) 1 g tablet [[id number]]  DISCONTINUED  Have you contacted your pharmacy to request a refill? Yes   Which pharmacy would you like this sent to?  [facility] Delivery - [place], [place] - [address] [place] [address] Phone: [phone] Fax: [phone]    Patient notified that their request is being sent to the clinical staff for review and that they should receive a response within 2 business days.   Please advise at [facility] [phone]  Prescription Request  09/04/2022  LOV: 06/24/22  What is the name of the medication or equipment? doxazosin (CARDURA) 4 MG tablet [[id number]]  Have you contacted your pharmacy to request a refill? Yes   Which pharmacy would you like this sent to?  [facility] Delivery - [place], [place] - [address] [place] [address] Phone: [phone] Fax: [phone]    Patient notified that their request is being sent to the clinical staff for review and that they should receive a response within 2 business days.   Please advise at [facility] [phone]  Prescription Request  09/04/2022  LOV: 06/24/22  What is the name of the medication or equipment? valsartan (DIOVAN) 80 MG tablet [[id number]]  Have you contacted your pharmacy to request a refill? Yes   Which pharmacy would you like this sent to?  [facility] Delivery - [place],  - [address] [place] [address] Phone: [phone] Fax: [phone]    Patient notified that their request is being sent to the clinical staff for review and that they should receive a response within 2 business days.   Please advise at [facility] [phone]  Prescription Request  09/04/2022  LOV: 06/24/22  What is the name of the medication or equipment? ibuprofen (ADVIL,MOTRIN) 200 MG tablet [[id number]]  DISCONTINUED  Have you contacted your pharmacy  to request a refill? Yes   Which pharmacy would you like this sent to?  [facility] Delivery - [place], [place] - [address] [place] [address] Phone: [phone] Fax: [phone]    Patient notified that their request is being sent to the clinical staff for review and that they should receive a response within 2 business days.   Please advise at [facility] [phone]  Prescription Request  09/04/2022  LOV: 06/24/22  What is the name of the medication or equipment? predniSONE (DELTASONE) 20 MG tablet [[id number]]  Have you contacted your pharmacy to request a refill? Yes   Which pharmacy would you like this sent to?  [facility] Delivery - [place], [place] - [address] [place] [address] Phone: [phone] Fax: [phone]    Patient notified that their request is being sent to the clinical staff for review and that they should receive a response within 2 business days.   Please advise at [facility] [phone]

## 2022-09-05 MED ORDER — VALSARTAN 80 MG PO TABS: 80.0000 mg | ORAL_TABLET | Freq: Every day | ORAL | 1 refills | Status: DC

## 2022-09-05 MED ORDER — FLUTICASONE PROPIONATE 50 MCG/ACT NA SUSP: 2.0000 | Freq: Every day | NASAL | 2 refills | Status: DC

## 2022-09-05 MED ORDER — DULOXETINE HCL 30 MG PO CPEP
30.0000 mg | ORAL_CAPSULE | Freq: Every day | ORAL | 1 refills | Status: DC
Start: 2022-09-05 — End: 2023-01-29

## 2022-09-05 MED ORDER — DOXAZOSIN MESYLATE 4 MG PO TABS
4.0000 mg | ORAL_TABLET | Freq: Every day | ORAL | 1 refills | Status: DC
Start: 2022-09-05 — End: 2022-11-11

## 2022-09-05 MED ORDER — DEXLANSOPRAZOLE 60 MG PO CPDR: DELAYED_RELEASE_CAPSULE | ORAL | 1 refills | Status: DC

## 2022-09-05 NOTE — Telephone Encounter (Signed)
Requested medication (s) are due for refill today:   No  Requested medication (s) are on the active medication list:   Yes/No  See note below  Future visit scheduled:   No.    LOV 07/01/2022 and just before this on 06/24/2022   Last ordered: Prednisone 06/24/2022 #12, 0 refills for an acute issue - non delegated;   Carafate not on list, been discontinued;   ibuprofen was D/C'd 2/26/204 due to CKD;   Flonase 06/24/2022 16 g, 6 refills - still has refills;   Dexilant 02/23/2022 #90, 3 refills - enough until July;   Cymbalta 08/06/2022 #90, 1 refill;   Cardura 07/15/2022 #90, 0 refills;   Diovan 07/01/2022 #90, 3 refills     Requested Prescriptions  Pending Prescriptions Disp Refills   fluticasone (FLONASE) 50 MCG/ACT nasal spray 16 g 6    Sig: Place 2 sprays into both nostrils daily.     Ear, Nose, and Throat: Nasal Preparations - Corticosteroids Failed - 09/04/2022 11:37 AM      Failed - Valid encounter within last 12 months    Recent Outpatient Visits           1 year ago Essential hypertension, benign   [facility] Pickard, Priscille Heidelberg, MD   1 year ago Essential hypertension, benign   [facility] Pickard, Priscille Heidelberg, MD   1 year ago Essential hypertension, benign   [facility] Pickard, Priscille Heidelberg, MD   1 year ago Chest pain, unspecified type   [facility] [name], MD   1 year ago Chronic midline low back pain with bilateral sciatica   [facility] Valentino Nose, NP               dexlansoprazole (DEXILANT) 60 MG capsule 90 capsule 3     Gastroenterology: Proton Pump Inhibitors Failed - 09/04/2022 11:37 AM      Failed - Valid encounter within last 12 months    Recent Outpatient Visits           1 year ago Essential hypertension, benign   [facility] Pickard, Priscille Heidelberg, MD   1 year ago Essential hypertension, benign   [facility] Medicine Pickard, Priscille Heidelberg, MD   1  year ago Essential hypertension, benign   [facility] Pickard, Priscille Heidelberg, MD   1 year ago Chest pain, unspecified type   [facility] [name], MD   1 year ago Chronic midline low back pain with bilateral sciatica   [facility] Cathlean Marseilles A, NP               DULoxetine (CYMBALTA) 30 MG capsule 90 capsule 1    Sig: Take 1 capsule (30 mg total) by mouth daily.     Psychiatry: Antidepressants - SNRI - duloxetine Failed - 09/04/2022 11:37 AM      Failed - Cr in normal range and within 360 days    Creat  Date Value Ref Range Status  04/23/2022 1.49 (H) 0.70 - 1.35 mg/dL Final   Creatinine, Urine  Date Value Ref Range Status  04/23/2022 160 20 - 320 mg/dL Final         Failed - Last BP in normal range    BP Readings from Last 1 Encounters:  07/01/22 (!) 150/70         Failed - Valid encounter within last 6  months    Recent Outpatient Visits           1 year ago Essential hypertension, benign   [facility] Medicine Pickard, Priscille Heidelberg, MD   1 year ago Essential hypertension, benign   [facility] Pickard, Priscille Heidelberg, MD   1 year ago Essential hypertension, benign   [facility] Tanya Nones, Priscille Heidelberg, MD   1 year ago Chest pain, unspecified type   [facility] Medicine [name], MD   1 year ago Chronic midline low back pain with bilateral sciatica   [facility] Valentino Nose, NP              Passed - eGFR is 30 or above and within 360 days    GFR, Est African American  Date Value Ref Range Status  09/28/2020 84 > OR = 60 mL/min/1.27m2 Final   GFR, Est Non African American  Date Value Ref Range Status  09/28/2020 73 > OR = 60 mL/min/1.13m2 Final   GFR, Estimated  Date Value Ref Range Status  10/20/2021 47 (L) >60 mL/min Final    Comment:    (NOTE) Calculated using the CKD-EPI Creatinine Equation (2021)    eGFR  Date Value  Ref Range Status  04/23/2022 53 (L) > OR = 60 mL/min/1.23m2 Final  07/18/2021 47 (L) >59 mL/min/1.73 Final         Passed - Completed PHQ-2 or PHQ-9 in the last 360 days       doxazosin (CARDURA) 4 MG tablet 90 tablet 0     Cardiovascular:  Alpha Blockers Failed - 09/04/2022 11:37 AM      Failed - Last BP in normal range    BP Readings from Last 1 Encounters:  07/01/22 (!) 150/70         Failed - Valid encounter within last 6 months    Recent Outpatient Visits           1 year ago Essential hypertension, benign   [facility] Medicine Tanya Nones, Priscille Heidelberg, MD   1 year ago Essential hypertension, benign   [facility] Pickard, Priscille Heidelberg, MD   1 year ago Essential hypertension, benign   [facility] Pickard, Priscille Heidelberg, MD   1 year ago Chest pain, unspecified type   [facility] Medicine [name], MD   1 year ago Chronic midline low back pain with bilateral sciatica   [facility] Medicine Cathlean Marseilles A, NP               valsartan (DIOVAN) 80 MG tablet 90 tablet 3    Sig: Take 1 tablet (80 mg total) by mouth daily.     Cardiovascular:  Angiotensin Receptor Blockers Failed - 09/04/2022 11:37 AM      Failed - Cr in normal range and within 180 days    Creat  Date Value Ref Range Status  04/23/2022 1.49 (H) 0.70 - 1.35 mg/dL Final   Creatinine, Urine  Date Value Ref Range Status  04/23/2022 160 20 - 320 mg/dL Final         Failed - Last BP in normal range    BP Readings from Last 1 Encounters:  07/01/22 (!) 150/70         Failed - Valid encounter within last 6 months    Recent Outpatient Visits           1 year ago  Essential hypertension, benign   [facility] Medicine Pickard, Priscille Heidelberg, MD   1 year ago Essential hypertension, benign   [facility] Pickard, Priscille Heidelberg, MD   1 year ago Essential hypertension, benign   [facility] Medicine Tanya Nones, Priscille Heidelberg, MD   1 year  ago Chest pain, unspecified type   [facility] Medicine [name], MD   1 year ago Chronic midline low back pain with bilateral sciatica   [facility] Medicine Valentino Nose, NP              Passed - K in normal range and within 180 days    Potassium  Date Value Ref Range Status  04/23/2022 4.5 3.5 - 5.3 mmol/L Final         Passed - Patient is not pregnant

## 2022-09-19 ENCOUNTER — Encounter: Payer: Self-pay | Admitting: Gastroenterology

## 2022-09-19 ENCOUNTER — Telehealth: Payer: Self-pay

## 2022-09-19 NOTE — Telephone Encounter (Signed)
Prescription Request  09/19/2022  LOV: 06/24/22  What is the name of the medication or equipment? ibuprofen (ADVIL,MOTRIN) 200 MG tablet [[id number]]  Have you contacted your pharmacy to request a refill? Yes   Which pharmacy would you like this sent to?  [facility] Delivery - [place], [place] - [address] [address] Phone: [phone] Fax: [phone]    Patient notified that their request is being sent to the clinical staff for review and that they should receive a response within 2 business days.   Please advise at [facility] [phone]  Prescription Request  09/19/2022  LOV: 06/24/22  What is the name of the medication or equipment? predniSONE (DELTASONE) 20 MG tablet [[id number]]  Have you contacted your pharmacy to request a refill? Yes   Which pharmacy would you like this sent to?  [facility] Delivery - [place], [place] - [address] [place]  [address] Phone: [phone] Fax: [phone]    Patient notified that their request is being sent to the clinical staff for review and that they should receive a response within 2 business days.   Please advise at [facility] [phone]

## 2022-09-19 NOTE — Telephone Encounter (Signed)
ibuprofen (ADVIL,MOTRIN) 200 MG tablet is not on current medication list.

## 2022-09-19 NOTE — Telephone Encounter (Signed)
Prescription Request  09/19/2022  LOV: 06/24/22  What is the name of the medication or equipment? sucralfate (CARAFATE) 1 g tablet [[id number]]  DISCONTINUED  Have you contacted your pharmacy to request a refill? Yes   Which pharmacy would you like this sent to?  [facility] - [place], [place] - [address] [place] [address] Phone: [phone] Fax: [phone]    Patient notified that their request is being sent to the clinical staff for review and that they should receive a response within 2 business days.   Please advise at [facility] [phone]

## 2022-09-23 ENCOUNTER — Telehealth: Payer: Self-pay | Admitting: Family Medicine

## 2022-09-23 ENCOUNTER — Other Ambulatory Visit: Payer: Self-pay | Admitting: Family Medicine

## 2022-09-23 NOTE — Telephone Encounter (Signed)
Prescription Request  09/23/2022  LOV: 07/01/2022  What is the name of the medication or equipment?   predniSONE (DELTASONE) 20 MG tablet  Sucralfate tab    Have you contacted your pharmacy to request a refill? Yes   Which pharmacy would you like this sent to?  [facility] - [place], [place] - [address] [place] [address] Phone: [phone] Fax: [phone]    Patient notified that their request is being sent to the clinical staff for review and that they should receive a response within 2 business days.   Please advise pharmacist.

## 2022-09-24 ENCOUNTER — Other Ambulatory Visit: Payer: Self-pay | Admitting: Internal Medicine

## 2022-10-02 DIAGNOSIS — E1122 Type 2 diabetes mellitus with diabetic chronic kidney disease: Secondary | ICD-10-CM | POA: Diagnosis not present

## 2022-10-02 DIAGNOSIS — R809 Proteinuria, unspecified: Secondary | ICD-10-CM | POA: Diagnosis not present

## 2022-10-02 DIAGNOSIS — Z79899 Other long term (current) drug therapy: Secondary | ICD-10-CM | POA: Diagnosis not present

## 2022-10-02 DIAGNOSIS — D638 Anemia in other chronic diseases classified elsewhere: Secondary | ICD-10-CM | POA: Diagnosis not present

## 2022-10-03 LAB — LAB REPORT - SCANNED: EGFR: 55

## 2022-10-14 DIAGNOSIS — D509 Iron deficiency anemia, unspecified: Secondary | ICD-10-CM | POA: Diagnosis not present

## 2022-10-14 DIAGNOSIS — E1122 Type 2 diabetes mellitus with diabetic chronic kidney disease: Secondary | ICD-10-CM | POA: Diagnosis not present

## 2022-10-14 DIAGNOSIS — C642 Malignant neoplasm of left kidney, except renal pelvis: Secondary | ICD-10-CM | POA: Diagnosis not present

## 2022-10-22 ENCOUNTER — Encounter: Payer: Self-pay | Admitting: Internal Medicine

## 2022-10-22 ENCOUNTER — Ambulatory Visit: Payer: 59 | Attending: Internal Medicine | Admitting: Internal Medicine

## 2022-10-22 VITALS — BP 130/70 | HR 55 | Ht 65.0 in | Wt 156.0 lb

## 2022-10-22 DIAGNOSIS — E782 Mixed hyperlipidemia: Secondary | ICD-10-CM | POA: Diagnosis not present

## 2022-10-22 DIAGNOSIS — I10 Essential (primary) hypertension: Secondary | ICD-10-CM

## 2022-10-22 DIAGNOSIS — I2510 Atherosclerotic heart disease of native coronary artery without angina pectoris: Secondary | ICD-10-CM
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
## 2022-10-22 NOTE — Patient Instructions (Signed)
Medication Instructions:  Your physician recommends that you continue on your current medications as directed. Please refer to the Current Medication list given to you today.  *If you need a refill on your cardiac medications before your next appointment, please call your pharmacy*   Lab Work: LDL Direct  If you have labs (blood work) drawn today and your tests are completely normal, you will receive your results only by: MyChart Message (if you have MyChart) OR A paper copy in the mail If you have any lab test that is abnormal or we need to change your treatment, we will call you to review the results.   Testing/Procedures: NONE   Follow-Up: At [facility], you and your health needs are our priority.  As part of our continuing mission to provide you with exceptional heart care, we have created designated Provider Care Teams.  These Care Teams include your primary Cardiologist (physician) and Advanced Practice Providers (APPs -  Physician Assistants and Nurse Practitioners) who all work together to provide you with the care you need, when you need it.  We recommend signing up for the patient portal called "MyChart".  Sign up information is provided on this After Visit Summary.  MyChart is used to connect with patients for Virtual Visits (Telemedicine).  Patients are able to view lab/test results, encounter notes, upcoming appointments, etc.  Non-urgent messages can be sent to your provider as well.   To learn more about what you can do with MyChart, go to [email or web address].    Your next appointment:   1 year(s)  Provider:   Riley Lam, MD

## 2022-10-23 DIAGNOSIS — E782 Mixed hyperlipidemia: Secondary | ICD-10-CM

## 2022-10-23 LAB — LDL CHOLESTEROL, DIRECT: LDL Direct: 95 mg/dL (ref 0–99)

## 2022-10-24 ENCOUNTER — Ambulatory Visit (INDEPENDENT_AMBULATORY_CARE_PROVIDER_SITE_OTHER): Payer: 59 | Admitting: Family Medicine

## 2022-10-24 ENCOUNTER — Encounter: Payer: Self-pay | Admitting: Family Medicine

## 2022-10-24 VITALS — BP 122/62 | HR 68 | Temp 98.5°F | Ht 65.0 in | Wt 157.6 lb

## 2022-10-24 DIAGNOSIS — I10 Essential (primary) hypertension: Secondary | ICD-10-CM

## 2022-10-24 LAB — CBC WITH DIFFERENTIAL/PLATELET
Absolute Monocytes: 707 cells/uL (ref 200–950)
Basophils Absolute: 37 cells/uL (ref 0–200)
Basophils Relative: 0.6 %
Eosinophils Absolute: 291 cells/uL (ref 15–500)
Eosinophils Relative: 4.7 %
HCT: 37.3 % — ABNORMAL LOW (ref 38.5–50.0)
Hemoglobin: 12.4 g/dL — ABNORMAL LOW (ref 13.2–17.1)
Lymphs Abs: 2864 cells/uL (ref 850–3900)
MCH: 30.2 pg (ref 27.0–33.0)
MCHC: 33.2 g/dL (ref 32.0–36.0)
MCV: 91 fL (ref 80.0–100.0)
MPV: 10.3 fL (ref 7.5–12.5)
Monocytes Relative: 11.4 %
Neutro Abs: 2300 cells/uL (ref 1500–7800)
Neutrophils Relative %: 37.1 %
Platelets: 218 10*3/uL (ref 140–400)
RBC: 4.1 10*6/uL — ABNORMAL LOW (ref 4.20–5.80)
RDW: 13.5 % (ref 11.0–15.0)
Total Lymphocyte: 46.2 %
WBC: 6.2 10*3/uL (ref 3.8–10.8)

## 2022-10-24 LAB — COMPLETE METABOLIC PANEL WITH GFR
AG Ratio: 1.1 (calc) (ref 1.0–2.5)
ALT: 22 U/L (ref 9–46)
AST: 31 U/L (ref 10–35)
Albumin: 3.8 g/dL (ref 3.6–5.1)
Alkaline phosphatase (APISO): 40 U/L (ref 35–144)
BUN/Creatinine Ratio: 15 (calc) (ref 6–22)
BUN: 23 mg/dL (ref 7–25)
CO2: 29 mmol/L (ref 20–32)
Calcium: 9.6 mg/dL (ref 8.6–10.3)
Chloride: 104 mmol/L (ref 98–110)
Creat: 1.51 mg/dL — ABNORMAL HIGH (ref 0.70–1.35)
Globulin: 3.5 g/dL (calc) (ref 1.9–3.7)
Glucose, Bld: 102 mg/dL — ABNORMAL HIGH (ref 65–99)
Potassium: 4.5 mmol/L (ref 3.5–5.3)
Sodium: 138 mmol/L (ref 135–146)
Total Bilirubin: 0.4 mg/dL (ref 0.2–1.2)
Total Protein: 7.3 g/dL (ref 6.1–8.1)
eGFR: 52 mL/min/{1.73_m2} — ABNORMAL LOW (ref >=60)

## 2022-10-24 LAB — LIPID PANEL
Cholesterol: 141 mg/dL (ref <200)
HDL: 35 mg/dL — ABNORMAL LOW (ref >=40)
LDL Cholesterol (Calc): 85 mg/dL (calc)
Non-HDL Cholesterol (Calc): 106 mg/dL (calc) (ref <130)
Total CHOL/HDL Ratio: 4 (calc) (ref <5.0)
Triglycerides: 117 mg/dL (ref <150)

## 2022-10-24 MED ORDER — SUCRALFATE 1 G PO TABS
1.0000 g | ORAL_TABLET | Freq: Three times a day (TID) | ORAL | 3 refills | Status: DC
Start: 2022-10-24 — End: 2023-02-12
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
## 2022-10-28 ENCOUNTER — Encounter: Payer: Self-pay | Admitting: Gastroenterology

## 2022-10-28 ENCOUNTER — Ambulatory Visit (INDEPENDENT_AMBULATORY_CARE_PROVIDER_SITE_OTHER): Payer: 59 | Admitting: Gastroenterology

## 2022-10-28 VITALS — BP 132/69 | HR 60 | Temp 98.0°F | Ht 65.0 in | Wt 156.0 lb

## 2022-10-28 DIAGNOSIS — D509 Iron deficiency anemia, unspecified: Secondary | ICD-10-CM

## 2022-10-28 DIAGNOSIS — K219 Gastro-esophageal reflux disease without esophagitis: Secondary | ICD-10-CM | POA: Diagnosis not present

## 2022-10-28 DIAGNOSIS — R1013 Epigastric pain: Secondary | ICD-10-CM

## 2022-10-28 DIAGNOSIS — K5900 Constipation, unspecified: Secondary | ICD-10-CM

## 2022-10-28 MED ORDER — LINACLOTIDE 72 MCG PO CAPS: 72.0000 ug | ORAL_CAPSULE | Freq: Every day | ORAL | 3 refills | Status: DC

## 2022-10-28 NOTE — Progress Notes (Signed)
GI Office Note    Referring Provider: [name], MD Primary Care Physician:  [name], MD  Primary Gastroenterologist: Roetta Sessions, MD   Chief Complaint   Chief Complaint  Patient presents with   Follow-up    Stated kidney doctor wants [place] to figure out where his blood is going due to his hemoglobin levels. States that he has been having some abdominal pain.    History of Present Illness   Brandon Lawrence is a 62 y.o. male presenting today for follow up GERD, constipation, dyspepsia. H/O IDA, with resolution of iron deficiency but persistent normocytic anemia likely anemia of chronic diseae in setting of CKD.   Colonoscopy 2022: normal. Repeat 5 years. EGD 2022: normal esophagus s/p empiric dilation, normal stomach/duodenum  Labs from October 24, 2022: BUN 23, creatinine 1.51, estimated GFR 52, albumin 3.8, total bilirubin 0.4, alkaline phosphatase 40, AST 31, ALT 22, white blood cell count 6200, hemoglobin 12.4 up from 12.1, hematocrit 37.3, platelets 218,000.  Today: BM usually 3-4 times in the morning. Maybe 1-2 BMs later in the evening. Sometimes has problems with constipation. Out of Linzess and would like to have on hand for times he has constipation. No nocturnal stools. No melena, brbpr. Sometimes stools dark. Some upper abdominal pain since off sucralfate for several months. Takes prn only. Waiting for RX. Pain can happen throughout the day. Sometimes with meals. No heartburn. No dysphagia.    Medications   Current Outpatient Medications  Medication Sig Dispense Refill   dexlansoprazole (DEXILANT) 60 MG capsule TAKE 1 (ONE) CAPSULE BY MOUTH DAILY. 90 capsule 1   doxazosin (CARDURA) 4 MG tablet Take 1 tablet (4 mg total) by mouth daily. 90 tablet 1   DULoxetine (CYMBALTA) 30 MG capsule Take 1 capsule (30 mg total) by mouth daily. 90 capsule 1   famotidine (PEPCID) 20 MG tablet Take 1 tablet (20 mg total) by mouth at bedtime. 90 tablet 5   mirtazapine  (REMERON) 7.5 MG tablet Take 1 tablet (7.5 mg total) by mouth at bedtime. 90 tablet 1   sucralfate (CARAFATE) 1 g tablet Take 1 tablet (1 g total) by mouth in the morning, at noon, and at bedtime. 90 tablet 3   valsartan (DIOVAN) 80 MG tablet Take 1 tablet (80 mg total) by mouth daily. 90 tablet 1   No current facility-administered medications for this visit.    Allergies   Allergies as of 10/28/2022 - Review Complete 10/28/2022  Allergen Reaction Noted   Aspirin Other (See Comments) 09/21/2007   Nsaids Other (See Comments) 07/28/2020       Review of Systems   General: Negative for anorexia, weight loss, fever, chills, fatigue, weakness. ENT: Negative for hoarseness, difficulty swallowing , nasal congestion. CV: Negative for chest pain, angina, palpitations, dyspnea on exertion, peripheral edema.  Respiratory: Negative for dyspnea at rest, dyspnea on exertion, cough, sputum, wheezing.  GI: See history of present illness. GU:  Negative for dysuria, hematuria, urinary incontinence, urinary frequency, nocturnal urination.  Endo: Negative for unusual weight change.     Physical Exam   BP 132/69 (BP Location: Left Arm, Patient Position: Sitting, Cuff Size: Normal)   Pulse 60   Temp 98 F (36.7 C) (Oral)   Ht 5\' 5"  (1.651 m)   Wt 156 lb (70.8 kg)   SpO2 97%   BMI 25.96 kg/m    General: Well-nourished, well-developed in no acute distress.  Eyes: No icterus. Mouth: Oropharyngeal mucosa moist and  pink   Abdomen: Bowel sounds are normal, nontender, nondistended, no hepatosplenomegaly or masses,  no abdominal bruits or hernia , no rebound or guarding.  Rectal: not performed Extremities: No lower extremity edema. No clubbing or deformities. Neuro: Alert and oriented x 4   Skin: Warm and dry, no jaundice.   Psych: Alert and cooperative, normal mood and affect.  Labs   Lab Results  Component Value Date   CREATININE 1.51 (H) 10/24/2022   BUN 23 10/24/2022   NA 138 10/24/2022    K 4.5 10/24/2022   CL 104 10/24/2022   CO2 29 10/24/2022   Lab Results  Component Value Date   ALT 22 10/24/2022   AST 31 10/24/2022   ALKPHOS 38 10/20/2021   BILITOT 0.4 10/24/2022   Lab Results  Component Value Date   WBC 6.2 10/24/2022   HGB 12.4 (L) 10/24/2022   HCT 37.3 (L) 10/24/2022   MCV 91.0 10/24/2022   PLT 218 10/24/2022    Imaging Studies   No results found.  Assessment   GERD/dyspepsia: has been out of sucralfate for several months. Feels like this usually helps his dyspepsia. Takes only as needed. Typical reflux symptoms controlled on Dexilant in am and Pepcid in pm. Waiting to receive sucralfate from his mail order pharmacy.   Constipation: out of Linzess.  He would like to have on hand for intermittent constipation needs.  Currently having regular bowel movements.  IDA: History of IDA, iron deficiency symptoms resolved last year.  Continued with mild nonspecific anemia here for possible with normocytic indices.  No overt GI bleeding.  EGD and colonoscopy in 2022 as outlined.  Last hemoglobin 12.4.  Patient states that he saw his nephrologist within the past 2 weeks and he mentioned that he should follow-up with [place] to figure out where his blood is going.  Suspect renal insufficiency and prior partial nephrectomy may be playing a role here.   PLAN   Continue Dexilant 60 mg in the morning, Pepcid 20 mg in the evening, sucralfate as needed.  With ongoing abdominal pain after resuming sucralfate. Avoid NSAIDs is much as possible. Continue Linzess 72 mcg daily as needed. iFOBT.  Leanna Battles. Melvyn Neth, MHS, PA-C [facility]

## 2022-10-28 NOTE — Patient Instructions (Addendum)
Complete stool test and return to our office. Further recommendations based on results. Continue dexlansoprazole once daily before breakfast for acid reflux. Continue famotidine 20 mg at bedtime for acid reflux. Start back on sucralfate 1 tablet to 3 times a day as needed for abdominal pain.  Take only as needed. Call with ongoing abdominal pain. I have sent in RX for Linzess 72 mcg to take once daily AS NEEDED for constipation.

## 2022-10-30 ENCOUNTER — Ambulatory Visit (INDEPENDENT_AMBULATORY_CARE_PROVIDER_SITE_OTHER): Payer: 59 | Admitting: Gastroenterology

## 2022-10-30 ENCOUNTER — Other Ambulatory Visit: Payer: Self-pay

## 2022-10-30 DIAGNOSIS — D509 Iron deficiency anemia, unspecified: Secondary | ICD-10-CM

## 2022-10-30 LAB — IFOBT (OCCULT BLOOD): IFOBT: NEGATIVE

## 2022-10-31 ENCOUNTER — Telehealth: Payer: Self-pay

## 2022-10-31 ENCOUNTER — Other Ambulatory Visit: Payer: Self-pay | Admitting: Family Medicine

## 2022-10-31 NOTE — Telephone Encounter (Signed)
Prescription Request  10/31/2022  LOV: 10/24/22  What is the name of the medication or equipment? amLODipine (NORVASC) 10 MG tablet [[id number]]  DISCONTINUED  Have you contacted your pharmacy to request a refill? Yes   Which pharmacy would you like this sent to?  [facility] - [place], [place] - [address] [place] [address] Phone: [phone] Fax: [phone]    Patient notified that their request is being sent to the clinical staff for review and that they should receive a response within 2 business days.   Please advise at [facility] [phone]  Prescription Request  10/31/2022  LOV: 10/24/22  What is the name of the medication or equipment? fluticasone (FLONASE) 50 MCG/ACT nasal spray [[id number]]  DISCONTINUED  Have you contacted your pharmacy to request a refill? Yes   Which pharmacy would you like this sent to?  [facility] - [place], [place] - [address] [place] [address] Phone: [phone] Fax: [phone]    Patient notified that their request is being sent to the clinical staff for review and that they should receive a response within 2 business days.   Please advise at [facility] [phone]  Prescription Request  10/31/2022  LOV: 10/24/22  What is the name of the medication or equipment? famotidine (PEPCID) 20 MG tablet [[id number]]  Have you contacted your pharmacy to request a refill? Yes   Which pharmacy would you like this sent to?  [facility] - [place], [place] - [address] [place] [address] Phone: [phone] Fax: [phone]    Patient notified that their request is being sent to the clinical staff for review and that they should receive a response within 2 business days.   Please advise at [facility] [phone]

## 2022-11-01 ENCOUNTER — Other Ambulatory Visit: Payer: Self-pay

## 2022-11-04 NOTE — Telephone Encounter (Signed)
Requested Prescriptions  Pending Prescriptions Disp Refills   valsartan (DIOVAN) 80 MG tablet [Pharmacy Med Name: Valsartan 80 MG Oral Tablet] 100 tablet 0    Sig: TAKE 1 TABLET BY MOUTH DAILY     Cardiovascular:  Angiotensin Receptor Blockers Failed - 10/31/2022 10:17 PM      Failed - Cr in normal range and within 180 days    Creat  Date Value Ref Range Status  10/24/2022 1.51 (H) 0.70 - 1.35 mg/dL Final   Creatinine, Urine  Date Value Ref Range Status  04/23/2022 160 20 - 320 mg/dL Final         Failed - Valid encounter within last 6 months    Recent Outpatient Visits           1 year ago Essential hypertension, benign   [facility] Pickard, Priscille Heidelberg, MD   1 year ago Essential hypertension, benign   [facility] Pickard, Priscille Heidelberg, MD   1 year ago Essential hypertension, benign   [facility] Pickard, Priscille Heidelberg, MD   1 year ago Chest pain, unspecified type   [facility] [name], MD   1 year ago Chronic midline low back pain with bilateral sciatica   [facility] Valentino Nose, NP       Future Appointments             In 2 months [name], MD [facility]            Passed - K in normal range and within 180 days    Potassium  Date Value Ref Range Status  10/24/2022 4.5 3.5 - 5.3 mmol/L Final         Passed - Patient is not pregnant      Passed - Last BP in normal range    BP Readings from Last 1 Encounters:  10/28/22 132/69

## 2022-11-10 ENCOUNTER — Other Ambulatory Visit: Payer: Self-pay | Admitting: Family Medicine

## 2022-11-10 DIAGNOSIS — I10 Essential (primary) hypertension: Secondary | ICD-10-CM

## 2022-11-11 ENCOUNTER — Other Ambulatory Visit: Payer: Self-pay

## 2022-11-11 ENCOUNTER — Telehealth: Payer: Self-pay | Admitting: Internal Medicine

## 2022-11-11 DIAGNOSIS — D509 Iron deficiency anemia, unspecified: Secondary | ICD-10-CM

## 2022-11-11 NOTE — Telephone Encounter (Signed)
See result note.  

## 2022-11-11 NOTE — Telephone Encounter (Signed)
May replace last RF with #[phone] for best insurance benefit  Requested Prescriptions  Pending Prescriptions Disp Refills   doxazosin (CARDURA) 4 MG tablet [Pharmacy Med Name: Doxazosin Mesylate 4 MG Oral Tablet] 100 tablet 0    Sig: TAKE 1 TABLET BY MOUTH DAILY     Cardiovascular:  Alpha Blockers Failed - 11/10/2022 10:06 PM      Failed - Valid encounter within last 6 months    Recent Outpatient Visits           1 year ago Essential hypertension, benign   [facility] Pickard, Priscille Heidelberg, MD   1 year ago Essential hypertension, benign   [facility] Pickard, Priscille Heidelberg, MD   1 year ago Essential hypertension, benign   [facility] Pickard, Priscille Heidelberg, MD   1 year ago Chest pain, unspecified type   [facility] [name], MD   1 year ago Chronic midline low back pain with bilateral sciatica   [facility] Valentino Nose, NP       Future Appointments             In 2 months Pickard, Priscille Heidelberg, MD [facility], [facility]            Passed - Last BP in normal range    BP Readings from Last 1 Encounters:  10/28/22 132/69

## 2022-11-11 NOTE — Telephone Encounter (Signed)
Patient stopped by around 11:35 and asked who had been calling him.  I saw your notes where you had and I told him you just went to lunch and he asked to call back on the cell #.

## 2022-11-28 DIAGNOSIS — R809 Proteinuria, unspecified: Secondary | ICD-10-CM | POA: Diagnosis not present

## 2022-11-28 DIAGNOSIS — D638 Anemia in other chronic diseases classified elsewhere: Secondary | ICD-10-CM | POA: Diagnosis not present

## 2022-11-28 DIAGNOSIS — N189 Chronic kidney disease, unspecified: Secondary | ICD-10-CM | POA: Diagnosis not present

## 2022-11-28 DIAGNOSIS — E1122 Type 2 diabetes mellitus with diabetic chronic kidney disease: Secondary | ICD-10-CM | POA: Diagnosis not present

## 2022-11-28 DIAGNOSIS — E1129 Type 2 diabetes mellitus with other diabetic kidney complication: Secondary | ICD-10-CM | POA: Diagnosis not present

## 2022-12-02 ENCOUNTER — Other Ambulatory Visit: Payer: Self-pay | Admitting: Family Medicine

## 2022-12-03 NOTE — Telephone Encounter (Signed)
Requested medication (s) are due for refill today: No  Requested medication (s) are on the active medication list: Yes  Last refill:  09/05/22  Future visit scheduled: No  Notes to clinic:  Request is early.    Requested Prescriptions  Pending Prescriptions Disp Refills   dexlansoprazole (DEXILANT) 60 MG capsule [Pharmacy Med Name: DEXLANSOPRAZOLE  60MG   CAP  DELAYED RELEASE] 100 capsule 2    Sig: TAKE 1 CAPSULE BY MOUTH DAILY     Gastroenterology: Proton Pump Inhibitors Failed - 12/02/2022 10:09 PM      Failed - Valid encounter within last 12 months    Recent Outpatient Visits           1 year ago Essential hypertension, benign   [facility] Tanya Nones, Priscille Heidelberg, MD   1 year ago Essential hypertension, benign   [facility] Pickard, Priscille Heidelberg, MD   1 year ago Essential hypertension, benign   [facility] Pickard, Priscille Heidelberg, MD   1 year ago Chest pain, unspecified type   [facility] [name], MD   1 year ago Chronic midline low back pain with bilateral sciatica   [facility] Valentino Nose, NP       Future Appointments             In 1 month Pickard, Priscille Heidelberg, MD [facility]

## 2022-12-04 ENCOUNTER — Encounter: Payer: Self-pay | Admitting: Family Medicine

## 2022-12-04 ENCOUNTER — Ambulatory Visit (INDEPENDENT_AMBULATORY_CARE_PROVIDER_SITE_OTHER): Payer: 59 | Admitting: Family Medicine

## 2022-12-04 VITALS — BP 130/58 | HR 63 | Temp 98.3°F | Ht 65.0 in | Wt 153.0 lb

## 2022-12-04 DIAGNOSIS — K529 Noninfective gastroenteritis and colitis, unspecified: Secondary | ICD-10-CM

## 2022-12-04 MED ORDER — ONDANSETRON HCL 4 MG PO TABS: 4.0000 mg | ORAL_TABLET | Freq: Three times a day (TID) | ORAL | 0 refills | Status: DC | PRN
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
## 2022-12-04 NOTE — Assessment & Plan Note (Signed)
Patients symptoms consistent with gastroenteritis since yesterday. No red flags on exam. No diverticulosis on 2022 colonoscopy. Will start Zofran 4mg  q8h PRN. Encouraged him to push electrolyte fluids, broth, and bland foods as tolerated. Strict instructions to seek medical care if symptoms worsen, he becomes febrile, or if he is unable to tolerate oral intake for >24 hours.

## 2022-12-06 LAB — LAB REPORT - SCANNED
Calcium: 9.7
EGFR: 50
PTH: 21

## 2022-12-11 ENCOUNTER — Telehealth: Payer: Self-pay | Admitting: Gastroenterology

## 2022-12-11 ENCOUNTER — Encounter: Payer: Self-pay | Admitting: Gastroenterology

## 2022-12-11 ENCOUNTER — Ambulatory Visit: Payer: 59 | Admitting: Gastroenterology

## 2022-12-11 VITALS — BP 138/58 | HR 76 | Temp 98.2°F | Ht 65.0 in | Wt 156.4 lb

## 2022-12-11 DIAGNOSIS — K5900 Constipation, unspecified: Secondary | ICD-10-CM

## 2022-12-11 DIAGNOSIS — K219 Gastro-esophageal reflux disease without esophagitis: Secondary | ICD-10-CM

## 2022-12-11 DIAGNOSIS — R1013 Epigastric pain: Secondary | ICD-10-CM

## 2022-12-11 MED ORDER — LUBIPROSTONE 8 MCG PO CAPS: ORAL_CAPSULE | ORAL | 5 refills | Status: DC

## 2022-12-11 NOTE — Progress Notes (Signed)
GI Office Note    Referring Provider: [name], MD Primary Care Physician:  [name], MD  Primary Gastroenterologist: Roetta Sessions, MD   Chief Complaint   Chief Complaint  Patient presents with   Abdominal Pain    Been going on for a couple weeks    History of Present Illness   Brandon Lawrence is a 62 y.o. male presenting today for follow up. Last seen in 10/2022. H/o GERD, constipation, dyspepsia. H/O IDA, with resolution of iron deficiency but persistent normocytic anemia likely anemia of chronic disease in setting of CKD.   Stool ifobt negative in 10/2022. Labs at that time with Hgb 12.4 (stable to improved from baseline over past 3-4 years). Most recent labs from 11/28/22 with Creatinine 1.57, WBC 5700, Hgb 12.7L, HCT 37.5, Platelets 238, iron 65, TIBC 299, iron sat 22.  Today: was doing okay until couple of weeks ago. He had episode of abdominal pain, N/V/D after eating strawberries, pineapples, apples. States he may have ate too many, noting he ate a lot. Seems to tolerate fruit better if blended as opposed to whole. Saw PCP who gave him zofran. States he improved but had recurrent symptoms after eating 1/2 of an apple pie in course of few hours. Symptoms associated with bloating. He is on dexilant 60mg  daily and famotidine 20mg  at bedtime. Takes sucralfate about once daily. BM slowed since on iron. Typically has BM once daily but not as easy and has to strain at times. No melena, brbpr. Does not tolerate Linzess daily, too strong. Rarely takes.   Colonoscopy 2022: normal. Repeat 5 years.  EGD 2022: normal esophagus s/p empiric dilation, normal stomach/duodenum   Medications   Current Outpatient Medications  Medication Sig Dispense Refill   dexlansoprazole (DEXILANT) 60 MG capsule TAKE 1 CAPSULE BY MOUTH DAILY 100 capsule 2   doxazosin (CARDURA) 4 MG tablet TAKE 1 TABLET BY MOUTH DAILY 100 tablet 0   DULoxetine (CYMBALTA) 30 MG capsule Take 1  capsule (30 mg total) by mouth daily. 90 capsule 1   famotidine (PEPCID) 20 MG tablet Take 1 tablet (20 mg total) by mouth at bedtime. 90 tablet 5   linaclotide (LINZESS) 72 MCG capsule Take 1 capsule (72 mcg total) by mouth daily before breakfast. 90 capsule 3   mirtazapine (REMERON) 7.5 MG tablet Take 1 tablet (7.5 mg total) by mouth at bedtime. 90 tablet 1   ondansetron (ZOFRAN) 4 MG tablet Take 1 tablet (4 mg total) by mouth every 8 (eight) hours as needed for nausea or vomiting. 20 tablet 0   sucralfate (CARAFATE) 1 g tablet Take 1 tablet (1 g total) by mouth in the morning, at noon, and at bedtime. 90 tablet 3   valsartan (DIOVAN) 80 MG tablet TAKE 1 TABLET BY MOUTH DAILY 100 tablet 0   No current facility-administered medications for this visit.    Allergies   Allergies as of 12/11/2022 - Review Complete 12/11/2022  Allergen Reaction Noted   Aspirin Other (See Comments) 09/21/2007   Nsaids Other (See Comments) 07/28/2020        Review of Systems   General: Negative for anorexia, weight loss, fever, chills, fatigue, weakness. ENT: Negative for hoarseness, difficulty swallowing , nasal congestion. CV: Negative for chest pain, angina, palpitations, dyspnea on exertion, peripheral edema.  Respiratory: Negative for dyspnea at rest, dyspnea on exertion, cough, sputum, wheezing.  GI: See history of present illness. GU:  Negative for dysuria, hematuria, urinary incontinence,  urinary frequency, nocturnal urination.  Endo: Negative for unusual weight change.     Physical Exam   BP (!) 138/58 (BP Location: Right Arm, Patient Position: Sitting, Cuff Size: Normal)   Pulse 76   Temp 98.2 F (36.8 C) (Oral)   Ht 5\' 5"  (1.651 m)   Wt 156 lb 6.4 oz (70.9 kg)   SpO2 96%   BMI 26.03 kg/m    General: Well-nourished, well-developed in no acute distress.  Eyes: No icterus. Mouth: Oropharyngeal mucosa moist and pink  Abdomen: Bowel sounds are normal, nontender, nondistended, no  hepatosplenomegaly or masses,  no abdominal bruits or hernia , no rebound or guarding.  Rectal: not performed Extremities: No lower extremity edema. No clubbing or deformities. Neuro: Alert and oriented x 4   Skin: Warm and dry, no jaundice.   Psych: Alert and cooperative, normal mood and affect.  Labs   Lab Results  Component Value Date   NA 138 10/24/2022   CL 104 10/24/2022   K 4.5 10/24/2022   CO2 29 10/24/2022   BUN 23 10/24/2022   CREATININE 1.51 (H) 10/24/2022   EGFR 52 (L) 10/24/2022   CALCIUM 9.6 10/24/2022   ALBUMIN 4.4 10/20/2021   GLUCOSE 102 (H) 10/24/2022   Lab Results  Component Value Date   ALT 22 10/24/2022   AST 31 10/24/2022   ALKPHOS 38 10/20/2021   BILITOT 0.4 10/24/2022   Lab Results  Component Value Date   WBC 6.2 10/24/2022   HGB 12.4 (L) 10/24/2022   HCT 37.3 (L) 10/24/2022   MCV 91.0 10/24/2022   PLT 218 10/24/2022    Imaging Studies   No results found.  Assessment   *GERD *Dyspepsia *Constipation  I suspect recent N/V due to GERD, dyspepsia in setting of consuming increased acidic foods, overeating. Was doing well prior to recent onset symptoms after eating significant amounts of apples, strawberries, pineapples etc. Will continue to monitor, if ongoing symptoms he will need further work up.   Constipation poorly controlled due to intolerance to Linzess (stools too loose/frequent).   H/O IDA as outlined above, Hgb continues to improve. Ifobt negative. EGD/colonoscopy in 2022. I suspect anemia related to CKD, prior partial nephrectomy. Recent iron studies normal. If future decline in Hgb, would consider updating colonoscopy +/- EGD. If ongoing abdominal pain, plan to update EGD.    PLAN   Stop Linzess. Start lubiprostone once to twice daily for constipation. Continue dexlansoprazole 60mg  daily before breakfast.  Continue famotidine 20mg  at bedtime for acid reflux. Use sucralfate up to four times daily as needed for  breakthrough reflux, abdominal pain. Limit acidic foods, avoid overeating. Reinforced antireflux measures. If ongoing N/V, abdominal pain, he will let me know. At that time would consider EGD.  We will have patient return to the office in 3 months.    Leanna Battles. Melvyn Neth, MHS, PA-C [facility]

## 2022-12-11 NOTE — Patient Instructions (Addendum)
Stop Linzess. Start lubiprostone once to twice daily for constipation. RX sent to [facility]. Continue dexlansoprazole 60mg  daily before breakfast for acid reflux.  Continue famotidine 20mg  at bedtime for acid reflux. You can use sucralfate up to four times daily for ongoing heartburn, nausea, vomiting, abdominal pain. Please try to avoid overeating, decrease acidic fruits (berries, oranges, lemon, lime, grapefruit). If you have recurrent symptoms of N/V and ongoing upper abdominal pain, please let me know and we would consider upper endoscopy.

## 2022-12-11 NOTE — Telephone Encounter (Signed)
Patient needs ov in 3 months.

## 2022-12-25 ENCOUNTER — Telehealth: Payer: Self-pay

## 2022-12-25 ENCOUNTER — Other Ambulatory Visit: Payer: Self-pay | Admitting: Family Medicine

## 2022-12-25 MED ORDER — ONDANSETRON HCL 4 MG PO TABS: 4.0000 mg | ORAL_TABLET | Freq: Three times a day (TID) | ORAL | 0 refills | Status: AC | PRN

## 2022-12-25 NOTE — Telephone Encounter (Signed)
Pt is requesting refills on Zofran. Pt is requesting that it be sent to [facility]. Pt was last seen on 12/11/2022.

## 2022-12-25 NOTE — Addendum Note (Signed)
Addended by: Tiffany Kocher on: 12/25/2022 01:02 PM   Modules accepted: Orders

## 2023-01-07 ENCOUNTER — Other Ambulatory Visit: Payer: Self-pay | Admitting: Family Medicine

## 2023-01-09 NOTE — Telephone Encounter (Signed)
Called pt to assess sx. Pt stated he has a stuffy nose especially at bedtime and is requesting Flonase reordered to send to [facility] order. Verified pharmacy with pt.  Last RF 09/05/22 and ended 10/30/22 "pt no longer using" Please reorder for pt if appropriate.  Requested Prescriptions  Pending Prescriptions Disp Refills   fluticasone (FLONASE) 50 MCG/ACT nasal spray [Pharmacy Med Name: Fluticasone Propionate 50 MCG/ACT Nasal Suspension] 48 g     Sig: USE 2 SPRAYS IN BOTH NOSTRILS  DAILY     Ear, Nose, and Throat: Nasal Preparations - Corticosteroids Failed - 01/07/2023 10:03 AM      Failed - Valid encounter within last 12 months    Recent Outpatient Visits           1 year ago Essential hypertension, benign   [facility] Tanya Nones, Priscille Heidelberg, MD   1 year ago Essential hypertension, benign   [facility] Pickard, Priscille Heidelberg, MD   1 year ago Essential hypertension, benign   [facility] Pickard, Priscille Heidelberg, MD   1 year ago Chest pain, unspecified type   [facility] [name], MD   1 year ago Chronic midline low back pain with bilateral sciatica   [facility] Valentino Nose, NP       Future Appointments             In 2 weeks Pickard, Priscille Heidelberg, MD [facility], [facility]   In 5 months Pollyann Savoy, MD [facility] Rheumatology

## 2023-01-19 ENCOUNTER — Other Ambulatory Visit: Payer: Self-pay | Admitting: Family Medicine

## 2023-01-20 DIAGNOSIS — E1129 Type 2 diabetes mellitus with other diabetic kidney complication: Secondary | ICD-10-CM | POA: Diagnosis not present

## 2023-01-20 DIAGNOSIS — E1122 Type 2 diabetes mellitus with diabetic chronic kidney disease: Secondary | ICD-10-CM | POA: Diagnosis not present

## 2023-01-20 DIAGNOSIS — D638 Anemia in other chronic diseases classified elsewhere: Secondary | ICD-10-CM | POA: Diagnosis not present

## 2023-01-20 DIAGNOSIS — R809 Proteinuria, unspecified: Secondary | ICD-10-CM | POA: Diagnosis not present

## 2023-01-21 NOTE — Telephone Encounter (Signed)
Requested Prescriptions  Pending Prescriptions Disp Refills   valsartan (DIOVAN) 80 MG tablet [Pharmacy Med Name: Valsartan 80 MG Oral Tablet] 100 tablet 0    Sig: TAKE 1 TABLET BY MOUTH DAILY     Cardiovascular:  Angiotensin Receptor Blockers Failed - 01/19/2023 10:09 PM      Failed - Cr in normal range and within 180 days    Creat  Date Value Ref Range Status  10/24/2022 1.51 (H) 0.70 - 1.35 mg/dL Final   Creatinine, Urine  Date Value Ref Range Status  04/23/2022 160 20 - 320 mg/dL Final         Failed - Valid encounter within last 6 months    Recent Outpatient Visits           1 year ago Essential hypertension, benign   [facility] Pickard, Priscille Heidelberg, MD   1 year ago Essential hypertension, benign   [facility] Pickard, Priscille Heidelberg, MD   1 year ago Essential hypertension, benign   [facility] Pickard, Priscille Heidelberg, MD   1 year ago Chest pain, unspecified type   [facility] [name], MD   1 year ago Chronic midline low back pain with bilateral sciatica   [facility] Valentino Nose, NP       Future Appointments             In 3 days Tanya Nones Priscille Heidelberg, MD [facility], [facility]   In 4 months Pollyann Savoy, MD [facility] Rheumatology            Passed - K in normal range and within 180 days    Potassium  Date Value Ref Range Status  10/24/2022 4.5 3.5 - 5.3 mmol/L Final         Passed - Patient is not pregnant      Passed - Last BP in normal range    BP Readings from Last 1 Encounters:  12/11/22 (!) 138/58

## 2023-01-22 ENCOUNTER — Ambulatory Visit: Payer: 59 | Attending: Internal Medicine

## 2023-01-22 DIAGNOSIS — E782 Mixed hyperlipidemia: Secondary | ICD-10-CM

## 2023-01-22 LAB — LIPID PANEL
Chol/HDL Ratio: 3.9 ratio (ref 0.0–5.0)
Cholesterol, Total: 152 mg/dL (ref 100–199)
HDL: 39 mg/dL — ABNORMAL LOW (ref >39)
LDL Chol Calc (NIH): 103 mg/dL — ABNORMAL HIGH (ref 0–99)
Triglycerides: 47 mg/dL (ref 0–149)
VLDL Cholesterol Cal: 10 mg/dL (ref 5–40)

## 2023-01-24 ENCOUNTER — Ambulatory Visit (INDEPENDENT_AMBULATORY_CARE_PROVIDER_SITE_OTHER): Payer: 59 | Admitting: Family Medicine

## 2023-01-24 ENCOUNTER — Encounter: Payer: Self-pay | Admitting: Family Medicine

## 2023-01-24 VITALS — BP 128/68 | HR 61 | Temp 98.0°F | Ht 65.0 in | Wt 153.0 lb

## 2023-01-24 DIAGNOSIS — Z125 Encounter for screening for malignant neoplasm of prostate: Secondary | ICD-10-CM | POA: Diagnosis not present

## 2023-01-24 DIAGNOSIS — I10 Essential (primary) hypertension: Secondary | ICD-10-CM | POA: Diagnosis not present

## 2023-01-24 MED ORDER — AMLODIPINE BESYLATE 10 MG PO TABS: 10.0000 mg | ORAL_TABLET | Freq: Every day | ORAL | 3 refills | Status: DC
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
## 2023-01-25 ENCOUNTER — Other Ambulatory Visit: Payer: Self-pay | Admitting: Family Medicine

## 2023-01-25 DIAGNOSIS — I10 Essential (primary) hypertension: Secondary | ICD-10-CM

## 2023-01-25 LAB — COMPLETE METABOLIC PANEL WITH GFR
AG Ratio: 1.1 (calc) (ref 1.0–2.5)
ALT: 17 U/L (ref 9–46)
AST: 23 U/L (ref 10–35)
Albumin: 3.9 g/dL (ref 3.6–5.1)
Alkaline phosphatase (APISO): 41 U/L (ref 35–144)
BUN/Creatinine Ratio: 18 (calc) (ref 6–22)
BUN: 29 mg/dL — ABNORMAL HIGH (ref 7–25)
CO2: 28 mmol/L (ref 20–32)
Calcium: 9.3 mg/dL (ref 8.6–10.3)
Chloride: 105 mmol/L (ref 98–110)
Creat: 1.58 mg/dL — ABNORMAL HIGH (ref 0.70–1.35)
Globulin: 3.4 g/dL (calc) (ref 1.9–3.7)
Glucose, Bld: 102 mg/dL — ABNORMAL HIGH (ref 65–99)
Potassium: 4.6 mmol/L (ref 3.5–5.3)
Sodium: 140 mmol/L (ref 135–146)
Total Bilirubin: 0.4 mg/dL (ref 0.2–1.2)
Total Protein: 7.3 g/dL (ref 6.1–8.1)
eGFR: 49 mL/min/{1.73_m2} — ABNORMAL LOW (ref >=60)

## 2023-01-25 LAB — PSA: PSA: 0.64 ng/mL (ref <=4.00)

## 2023-01-28 ENCOUNTER — Other Ambulatory Visit: Payer: Self-pay | Admitting: Family Medicine

## 2023-01-28 DIAGNOSIS — G8929 Other chronic pain: Secondary | ICD-10-CM

## 2023-01-28 NOTE — Telephone Encounter (Signed)
Requested Prescriptions  Pending Prescriptions Disp Refills   doxazosin (CARDURA) 4 MG tablet [Pharmacy Med Name: Doxazosin Mesylate 4 MG Oral Tablet] 80 tablet 3    Sig: TAKE 1 TABLET BY MOUTH DAILY     Cardiovascular:  Alpha Blockers Failed - 01/25/2023 10:01 PM      Failed - Valid encounter within last 6 months    Recent Outpatient Visits           1 year ago Essential hypertension, benign   [facility] Pickard, Priscille Heidelberg, MD   1 year ago Essential hypertension, benign   [facility] Pickard, Priscille Heidelberg, MD   1 year ago Essential hypertension, benign   [facility] Pickard, Priscille Heidelberg, MD   1 year ago Chest pain, unspecified type   [facility] [name], MD   1 year ago Chronic midline low back pain with bilateral sciatica   [facility] Valentino Nose, NP       Future Appointments             In 4 months Pollyann Savoy, MD [facility]            Passed - Last BP in normal range    BP Readings from Last 1 Encounters:  01/24/23 128/68

## 2023-01-29 NOTE — Telephone Encounter (Signed)
Requested Prescriptions  Pending Prescriptions Disp Refills   DULoxetine (CYMBALTA) 30 MG capsule [Pharmacy Med Name: DULoxetine HCl 30 MG Oral Capsule Delayed Release Particles] 90 capsule 0    Sig: TAKE 1 CAPSULE BY MOUTH DAILY     Psychiatry: Antidepressants - SNRI - duloxetine Failed - 01/28/2023  9:57 AM      Failed - Cr in normal range and within 360 days    Creat  Date Value Ref Range Status  01/24/2023 1.58 (H) 0.70 - 1.35 mg/dL Final   Creatinine, Urine  Date Value Ref Range Status  04/23/2022 160 20 - 320 mg/dL Final         Failed - Valid encounter within last 6 months    Recent Outpatient Visits           1 year ago Essential hypertension, benign   [facility] [name], MD   1 year ago Essential hypertension, benign   [facility] Pickard, Priscille Heidelberg, MD   1 year ago Essential hypertension, benign   [facility] Pickard, Priscille Heidelberg, MD   1 year ago Chest pain, unspecified type   [facility] [name], MD   1 year ago Chronic midline low back pain with bilateral sciatica   [facility] Valentino Nose, NP       Future Appointments             In 4 months Pollyann Savoy, MD [facility]            Passed - eGFR is 30 or above and within 360 days    GFR, Est African American  Date Value Ref Range Status  09/28/2020 84 > OR = 60 mL/min/1.36m2 Final   GFR, Est Non African American  Date Value Ref Range Status  09/28/2020 73 > OR = 60 mL/min/1.61m2 Final   GFR, Estimated  Date Value Ref Range Status  10/20/2021 47 (L) >60 mL/min Final    Comment:    (NOTE) Calculated using the CKD-EPI Creatinine Equation (2021)    eGFR  Date Value Ref Range Status  01/24/2023 49 (L) > OR = 60 mL/min/1.92m2 Final  07/18/2021 47 (L) >59 mL/min/1.73 Final         Passed - Completed PHQ-2 or PHQ-9 in the last 360 days      Passed - Last BP in normal  range    BP Readings from Last 1 Encounters:  01/24/23 128/68

## 2023-01-31 ENCOUNTER — Telehealth: Payer: Self-pay

## 2023-01-31 DIAGNOSIS — I2510 Atherosclerotic heart disease of native coronary artery without angina pectoris: Secondary | ICD-10-CM

## 2023-01-31 DIAGNOSIS — E782 Mixed hyperlipidemia: Secondary | ICD-10-CM

## 2023-01-31 MED ORDER — ROSUVASTATIN CALCIUM 5 MG PO TABS: 5.0000 mg | ORAL_TABLET | Freq: Every day | ORAL | 3 refills | Status: DC

## 2023-01-31 NOTE — Telephone Encounter (Signed)
The patient has been notified of the result and verbalized understanding.  All questions (if any) were answered. [name], RN 01/31/2023 9:45 AM   Pt will come in for FLP, ALT on 04/21/23.

## 2023-01-31 NOTE — Telephone Encounter (Signed)
-----   Message from Christell Constant sent at 01/24/2023  1:11 PM EDT ----- Results: LDL above goal but improved with lifestyle changes Plan: Offer rosuvastatin 5 mg, lipids ALT in 3 months  Christell Constant, MD

## 2023-02-06 ENCOUNTER — Other Ambulatory Visit: Payer: Self-pay

## 2023-02-06 ENCOUNTER — Telehealth: Payer: Self-pay

## 2023-02-06 DIAGNOSIS — G8929 Other chronic pain: Secondary | ICD-10-CM

## 2023-02-06 MED ORDER — DULOXETINE HCL 30 MG PO CPEP: 30.0000 mg | ORAL_CAPSULE | Freq: Every day | ORAL | 1 refills | Status: DC

## 2023-02-06 NOTE — Telephone Encounter (Signed)
Pt's wife called in stating that pt is completely out of this med DULoxetine (CYMBALTA) 30 MG capsule [[id number]]. Pt usually uses [facility] mail order pharmacy, but would like to have courtesy refill sent to [facility] on [address] in [place].Pt's wife would like a cb from nurse please to confirm that med has been sent to pharmacy. Please advise.  Cb#: [phone]

## 2023-02-11 ENCOUNTER — Other Ambulatory Visit: Payer: Self-pay | Admitting: Family Medicine

## 2023-02-12 NOTE — Telephone Encounter (Signed)
Requested Prescriptions  Pending Prescriptions Disp Refills   sucralfate (CARAFATE) 1 g tablet [Pharmacy Med Name: Sucralfate 1 GM Oral Tablet] 300 tablet 2    Sig: TAKE 1 TABLET BY MOUTH IN THE  MORNING AT NOON, AND AT BEDTIME     Gastroenterology: Antiacids Failed - 02/11/2023 10:10 PM      Failed - Valid encounter within last 12 months    Recent Outpatient Visits           1 year ago Essential hypertension, benign   [facility] [name], MD   1 year ago Essential hypertension, benign   [facility] Pickard, Priscille Heidelberg, MD   1 year ago Essential hypertension, benign   [facility] Pickard, Priscille Heidelberg, MD   1 year ago Chest pain, unspecified type   [facility] [name], MD   1 year ago Chronic midline low back pain with bilateral sciatica   [facility] Valentino Nose, NP       Future Appointments             In 4 months Pollyann Savoy, MD [facility]umat[facility]

## 2023-02-17 ENCOUNTER — Encounter: Payer: Self-pay | Admitting: Gastroenterology

## 2023-02-24 ENCOUNTER — Other Ambulatory Visit: Payer: Self-pay

## 2023-02-24 DIAGNOSIS — D509 Iron deficiency anemia, unspecified: Secondary | ICD-10-CM

## 2023-03-06 ENCOUNTER — Ambulatory Visit: Payer: 59

## 2023-03-06 VITALS — Ht 65.0 in | Wt 153.0 lb

## 2023-03-06 DIAGNOSIS — Z0000 Encounter for general adult medical examination without abnormal findings: Secondary | ICD-10-CM
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
## 2023-03-06 NOTE — Patient Instructions (Signed)
Mr. Brandon Lawrence , Thank you for taking time to come for your Medicare Wellness Visit. I appreciate your ongoing commitment to your health goals. Please review the following plan we discussed and let me know if I can assist you in the future.   Referrals/Orders/Follow-Ups/Clinician Recommendations: Aim for 30 minutes of exercise or brisk walking, 6-8 glasses of water, and 5 servings of fruits and vegetables each day.  This is a list of the screening recommended for you and due dates:  Health Maintenance  Topic Date Due   Zoster (Shingles) Vaccine (1 of 2) 04/25/2023*   COVID-19 Vaccine (3 - Moderna risk series) 05/02/2023*   Flu Shot  08/04/2023*   Yearly kidney health urinalysis for diabetes  04/24/2023   Yearly kidney function blood test for diabetes  01/24/2024   Medicare Annual Wellness Visit  03/05/2024   Colon Cancer Screening  08/15/2030   DTaP/Tdap/Td vaccine (3 - Td or Tdap) 02/27/2032   Hepatitis C Screening  Completed   HIV Screening  Completed   HPV Vaccine  Aged Out  *Topic was postponed. The date shown is not the original due date.    Advanced directives: (ACP Link)Information on Advanced Care Planning can be found at [facility] Advance Health Care Directives Advance Health Care Directives ([email or web address]   Next Medicare Annual Wellness Visit scheduled for next year: Yes

## 2023-03-07 ENCOUNTER — Ambulatory Visit (INDEPENDENT_AMBULATORY_CARE_PROVIDER_SITE_OTHER): Payer: 59 | Admitting: Gastroenterology

## 2023-03-07 ENCOUNTER — Encounter: Payer: Self-pay | Admitting: Gastroenterology

## 2023-03-07 VITALS — BP 119/64 | HR 59 | Temp 98.1°F | Ht 65.0 in | Wt 156.6 lb

## 2023-03-07 DIAGNOSIS — K5900 Constipation, unspecified: Secondary | ICD-10-CM

## 2023-03-07 DIAGNOSIS — K219 Gastro-esophageal reflux disease without esophagitis: Secondary | ICD-10-CM

## 2023-03-07 DIAGNOSIS — D649 Anemia, unspecified: Secondary | ICD-10-CM | POA: Diagnosis not present

## 2023-03-07 NOTE — Progress Notes (Signed)
GI Office Note    Referring Provider: [name], MD Primary Care Physician:  [name], MD  Primary Gastroenterologist: Roetta Sessions, MD   Chief Complaint   Chief Complaint  Patient presents with   Follow-up    Doing well, no issues    History of Present Illness   Brandon Lawrence is a 62 y.o. male presenting today for 102-month follow-up.  Last seen in August 2024.  History of GERD, constipation, dyspepsia, anemia.  Previous history of IDA with resolution, but persistent normocytic anemia likely anemia of chronic disease in the setting of CKD.  Hemoccult negative stool in June 2024.  Clinically doing well.  Denies any heartburn, abdominal pain.  Bowel movements regular on Amitiza.  No melena or rectal bleeding.  No dysphagia.  Has not completed follow-up labs yet.  Colonoscopy 2022: normal. Repeat 5 years.   EGD 2022: normal esophagus s/p empiric dilation, normal stomach/duodenum  Medications   Current Outpatient Medications  Medication Sig Dispense Refill   amLODipine (NORVASC) 10 MG tablet Take 1 tablet (10 mg total) by mouth daily. 90 tablet 3   dexlansoprazole (DEXILANT) 60 MG capsule TAKE 1 CAPSULE BY MOUTH DAILY 100 capsule 2   doxazosin (CARDURA) 4 MG tablet TAKE 1 TABLET BY MOUTH DAILY 80 tablet 3   DULoxetine (CYMBALTA) 30 MG capsule Take 1 capsule (30 mg total) by mouth daily. 30 capsule 1   famotidine (PEPCID) 20 MG tablet Take 1 tablet (20 mg total) by mouth at bedtime. 90 tablet 5   ferrous sulfate 325 (65 FE) MG EC tablet Take 325 mg by mouth daily with breakfast.     lubiprostone (AMITIZA) 8 MCG capsule Take one capsule once to twice daily with food for constipation. 60 capsule 5   ondansetron (ZOFRAN) 4 MG tablet Take 1 tablet (4 mg total) by mouth every 8 (eight) hours as needed for nausea or vomiting. 20 tablet 0   rosuvastatin (CRESTOR) 5 MG tablet Take 1 tablet (5 mg total) by mouth daily. 90 tablet 3   sucralfate (CARAFATE) 1 g tablet  TAKE 1 TABLET BY MOUTH IN THE  MORNING AT NOON, AND AT BEDTIME 90 tablet 3   valsartan (DIOVAN) 80 MG tablet TAKE 1 TABLET BY MOUTH DAILY 100 tablet 0   No current facility-administered medications for this visit.    Allergies   Allergies as of 03/07/2023 - Review Complete 03/07/2023  Allergen Reaction Noted   Aspirin Other (See Comments) 09/21/2007   Nsaids Other (See Comments) 07/28/2020    Review of Systems   General: Negative for anorexia, weight loss, fever, chills, fatigue, weakness. ENT: Negative for hoarseness, difficulty swallowing , nasal congestion. CV: Negative for chest pain, angina, palpitations, dyspnea on exertion, peripheral edema.  Respiratory: Negative for dyspnea at rest, dyspnea on exertion, cough, sputum, wheezing.  GI: See history of present illness. GU:  Negative for dysuria, hematuria, urinary incontinence, urinary frequency, nocturnal urination.  Endo: Negative for unusual weight change.     Physical Exam   BP 119/64 (BP Location: Right Arm, Patient Position: Sitting, Cuff Size: Normal)   Pulse (!) 59   Temp 98.1 F (36.7 C) (Oral)   Ht 5\' 5"  (1.651 m)   Wt 156 lb 9.6 oz (71 kg)   SpO2 97%   BMI 26.06 kg/m    General: Well-nourished, well-developed in no acute distress.  Eyes: No icterus. Mouth: Oropharyngeal mucosa moist and pink   Abdomen: Bowel sounds are normal, nontender,  nondistended, no hepatosplenomegaly or masses,  no abdominal bruits or hernia , no rebound or guarding.  Rectal: not performed  Extremities: No lower extremity edema. No clubbing or deformities. Neuro: Alert and oriented x 4   Skin: Warm and dry, no jaundice.   Psych: Alert and cooperative, normal mood and affect.  Labs   Lab Results  Component Value Date   NA 140 01/24/2023   CL 105 01/24/2023   K 4.6 01/24/2023   CO2 28 01/24/2023   BUN 29 (H) 01/24/2023   CREATININE 1.58 (H) 01/24/2023   EGFR 49 (L) 01/24/2023   CALCIUM 9.3 01/24/2023   ALBUMIN 4.4  10/20/2021   GLUCOSE 102 (H) 01/24/2023   Lab Results  Component Value Date   ALT 17 01/24/2023   AST 23 01/24/2023   ALKPHOS 38 10/20/2021   BILITOT 0.4 01/24/2023   Lab Results  Component Value Date   WBC 6.2 10/24/2022   HGB 12.4 (L) 10/24/2022   HCT 37.3 (L) 10/24/2022   MCV 91.0 10/24/2022   PLT 218 10/24/2022      Imaging Studies   No results found.  Assessment/Plan:   GERD/dyspepsia: -Doing well -Continue dexlansoprazole 60 mg in the morning and famotidine 20 mg in the evenings -Try stopping sucralfate -Continue antireflux measures  Constipation: -Doing well -Continue lubiprostone 8 mcg once to twice daily with food -Encouraged high-fiber diet, adequate fluids  Normocytic anemia: -Suspected anemia of chronic disease -For follow-up labs -Colonoscopy and upper endoscopy in 2022 as outlined above    Leanna Battles. Melvyn Neth, MHS, PA-C [facility]

## 2023-03-07 NOTE — Patient Instructions (Addendum)
Complete your labs at [facility] at your convenience. We will be in touch with results and further recommendations.  Continue to dexlansoprazole 60 mg daily and famotidine 20 mg daily.  It is best to separate these throughout the day to provide 24-hour coverage.  Consider taking dexlansoprazole in the morning and famotidine in the evenings. Since you are doing well, consider stopping sucralfate (Carafate).  If you have recurrent abdominal pain or reflux symptoms you can always add it back. Continue lubiprostone 8 mcg once to twice daily with food for constipation.

## 2023-03-13 DIAGNOSIS — D509 Iron deficiency anemia, unspecified: Secondary | ICD-10-CM | POA: Diagnosis not present

## 2023-03-14 ENCOUNTER — Other Ambulatory Visit: Payer: Self-pay | Admitting: Gastroenterology

## 2023-03-14 LAB — CBC WITH DIFFERENTIAL/PLATELET
Absolute Lymphocytes: 2662 {cells}/uL (ref 850–3900)
Absolute Monocytes: 513 {cells}/uL (ref 200–950)
Basophils Absolute: 32 {cells}/uL (ref 0–200)
Basophils Relative: 0.6 %
Eosinophils Absolute: 475 {cells}/uL (ref 15–500)
Eosinophils Relative: 8.8 %
HCT: 39.1 % (ref 38.5–50.0)
Hemoglobin: 12.8 g/dL — ABNORMAL LOW (ref 13.2–17.1)
MCH: 30 pg (ref 27.0–33.0)
MCHC: 32.7 g/dL (ref 32.0–36.0)
MCV: 91.8 fL (ref 80.0–100.0)
MPV: 9.7 fL (ref 7.5–12.5)
Monocytes Relative: 9.5 %
Neutro Abs: 1717 {cells}/uL (ref 1500–7800)
Neutrophils Relative %: 31.8 %
Platelets: 226 10*3/uL (ref 140–400)
RBC: 4.26 10*6/uL (ref 4.20–5.80)
RDW: 13.1 % (ref 11.0–15.0)
Total Lymphocyte: 49.3 %
WBC: 5.4 10*3/uL (ref 3.8–10.8)

## 2023-03-14 LAB — IRON,TIBC AND FERRITIN PANEL
%SAT: 28 % (ref 20–48)
Ferritin: 135 ng/mL (ref 24–380)
Iron: 83 ug/dL (ref 50–180)
TIBC: 295 ug/dL (ref 250–425)

## 2023-03-14 LAB — B12 AND FOLATE PANEL
Folate: 16.3 ng/mL
Vitamin B-12: 761 pg/mL (ref 200–1100)

## 2023-03-25 DIAGNOSIS — H2513 Age-related nuclear cataract, bilateral: Secondary | ICD-10-CM | POA: Diagnosis not present

## 2023-03-25 DIAGNOSIS — H31011 Macula scars of posterior pole (postinflammatory) (post-traumatic), right eye: Secondary | ICD-10-CM | POA: Diagnosis not present

## 2023-03-25 DIAGNOSIS — H31091 Other chorioretinal scars, right eye: Secondary | ICD-10-CM | POA: Diagnosis not present

## 2023-03-25 DIAGNOSIS — H35011 Changes in retinal vascular appearance, right eye: Secondary | ICD-10-CM | POA: Diagnosis not present

## 2023-03-30 ENCOUNTER — Other Ambulatory Visit: Payer: Self-pay | Admitting: Family Medicine

## 2023-04-01 ENCOUNTER — Ambulatory Visit: Payer: 59

## 2023-04-01 DIAGNOSIS — N189 Chronic kidney disease, unspecified: Secondary | ICD-10-CM | POA: Diagnosis not present

## 2023-04-01 DIAGNOSIS — N1832 Chronic kidney disease, stage 3b: Secondary | ICD-10-CM

## 2023-04-01 DIAGNOSIS — E1122 Type 2 diabetes mellitus with diabetic chronic kidney disease: Secondary | ICD-10-CM | POA: Diagnosis not present

## 2023-04-01 DIAGNOSIS — Z7984 Long term (current) use of oral hypoglycemic drugs: Secondary | ICD-10-CM

## 2023-04-01 DIAGNOSIS — E1129 Type 2 diabetes mellitus with other diabetic kidney complication: Secondary | ICD-10-CM | POA: Diagnosis not present

## 2023-04-01 DIAGNOSIS — R809 Proteinuria, unspecified: Secondary | ICD-10-CM | POA: Diagnosis not present

## 2023-04-01 DIAGNOSIS — D638 Anemia in other chronic diseases classified elsewhere: Secondary | ICD-10-CM | POA: Diagnosis not present

## 2023-04-01 NOTE — Progress Notes (Signed)
Brandon Lawrence arrived 04/01/2023 and has given verbal consent to obtain images and complete their overdue diabetic retinal screening.  The images have been sent to an ophthalmologist or optometrist for review and interpretation.  Results will be sent back to [name], MD for review.  Patient has been informed they will be contacted when we receive the results via telephone or MyChart

## 2023-04-01 NOTE — Telephone Encounter (Signed)
Last OV 01/24/23 Requested Prescriptions  Pending Prescriptions Disp Refills   valsartan (DIOVAN) 80 MG tablet [Pharmacy Med Name: Valsartan 80 MG Oral Tablet] 100 tablet 0    Sig: TAKE 1 TABLET BY MOUTH DAILY     Cardiovascular:  Angiotensin Receptor Blockers Failed - 03/30/2023 10:04 PM      Failed - Cr in normal range and within 180 days    Creat  Date Value Ref Range Status  01/24/2023 1.58 (H) 0.70 - 1.35 mg/dL Final   Creatinine, Urine  Date Value Ref Range Status  04/23/2022 160 20 - 320 mg/dL Final         Failed - Valid encounter within last 6 months    Recent Outpatient Visits           1 year ago Essential hypertension, benign   [facility] [name], MD   1 year ago Essential hypertension, benign   [facility] Pickard, Priscille Heidelberg, MD   1 year ago Essential hypertension, benign   [facility] Pickard, Priscille Heidelberg, MD   1 year ago Chest pain, unspecified type   [facility] [name], MD   1 year ago Chronic midline low back pain with bilateral sciatica   [facility] Valentino Nose, NP       Future Appointments             In 2 months Pollyann Savoy, MD [facility]ology - A Dept Of [place]            Passed - K in normal range and within 180 days    Potassium  Date Value Ref Range Status  01/24/2023 4.6 3.5 - 5.3 mmol/L Final         Passed - Patient is not pregnant      Passed - Last BP in normal range    BP Readings from Last 1 Encounters:  03/07/23 119/64

## 2023-04-16 ENCOUNTER — Other Ambulatory Visit: Payer: Self-pay | Admitting: Family Medicine

## 2023-04-16 DIAGNOSIS — G8929 Other chronic pain: Secondary | ICD-10-CM

## 2023-04-18 DIAGNOSIS — R809 Proteinuria, unspecified: Secondary | ICD-10-CM | POA: Diagnosis not present

## 2023-04-18 DIAGNOSIS — E1122 Type 2 diabetes mellitus with diabetic chronic kidney disease: Secondary | ICD-10-CM | POA: Diagnosis not present

## 2023-04-18 DIAGNOSIS — E1129 Type 2 diabetes mellitus with other diabetic kidney complication: Secondary | ICD-10-CM | POA: Diagnosis not present

## 2023-04-18 DIAGNOSIS — D638 Anemia in other chronic diseases classified elsewhere: Secondary | ICD-10-CM | POA: Diagnosis not present

## 2023-04-21 ENCOUNTER — Ambulatory Visit: Payer: 59

## 2023-04-21 DIAGNOSIS — E782 Mixed hyperlipidemia: Secondary | ICD-10-CM

## 2023-04-21 DIAGNOSIS — I2510 Atherosclerotic heart disease of native coronary artery without angina pectoris: Secondary | ICD-10-CM

## 2023-04-24 DIAGNOSIS — R809 Proteinuria, unspecified: Secondary | ICD-10-CM | POA: Diagnosis not present

## 2023-04-24 DIAGNOSIS — D631 Anemia in chronic kidney disease: Secondary | ICD-10-CM | POA: Diagnosis not present

## 2023-04-24 DIAGNOSIS — E211 Secondary hyperparathyroidism, not elsewhere classified: Secondary | ICD-10-CM | POA: Diagnosis not present

## 2023-04-24 DIAGNOSIS — N189 Chronic kidney disease, unspecified: Secondary | ICD-10-CM | POA: Diagnosis not present

## 2023-05-02 ENCOUNTER — Other Ambulatory Visit: Payer: Self-pay | Admitting: Family Medicine
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
## 2023-05-05 ENCOUNTER — Encounter: Payer: Self-pay | Admitting: Family Medicine

## 2023-05-05 ENCOUNTER — Ambulatory Visit (INDEPENDENT_AMBULATORY_CARE_PROVIDER_SITE_OTHER): Payer: 59 | Admitting: Family Medicine

## 2023-05-05 VITALS — BP 124/70 | HR 54 | Temp 98.6°F | Ht 65.0 in | Wt 161.5 lb

## 2023-05-05 DIAGNOSIS — N1832 Chronic kidney disease, stage 3b: Secondary | ICD-10-CM

## 2023-05-05 MED ORDER — SILDENAFIL CITRATE 100 MG PO TABS
50.0000 mg | ORAL_TABLET | Freq: Every day | ORAL | 11 refills | Status: AC | PRN
Start: 2023-05-05 — End: ?
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
## 2023-05-06 LAB — BASIC METABOLIC PANEL WITH GFR
BUN/Creatinine Ratio: 15 (calc) (ref 6–22)
BUN: 24 mg/dL (ref 7–25)
CO2: 29 mmol/L (ref 20–32)
Calcium: 8.7 mg/dL (ref 8.6–10.3)
Chloride: 102 mmol/L (ref 98–110)
Creat: 1.63 mg/dL — ABNORMAL HIGH (ref 0.70–1.35)
Glucose, Bld: 124 mg/dL — ABNORMAL HIGH (ref 65–99)
Potassium: 3.9 mmol/L (ref 3.5–5.3)
Sodium: 140 mmol/L (ref 135–146)
eGFR: 48 mL/min/{1.73_m2} — ABNORMAL LOW (ref >=60)

## 2023-05-06 LAB — PROTEIN / CREATININE RATIO, URINE
Creatinine, Urine: 166 mg/dL (ref 20–320)
Protein/Creat Ratio: 72 mg/g{creat} (ref 25–148)
Protein/Creatinine Ratio: 0.072 mg/mg{creat} (ref 0.025–0.148)
Total Protein, Urine: 12 mg/dL (ref 5–25)

## 2023-05-15 ENCOUNTER — Ambulatory Visit: Payer: 59 | Attending: Rheumatology | Admitting: Rheumatology

## 2023-05-15 ENCOUNTER — Ambulatory Visit: Payer: 59

## 2023-05-15 ENCOUNTER — Encounter: Payer: Self-pay | Admitting: Rheumatology

## 2023-05-15 VITALS — BP 106/58 | HR 57 | Resp 14 | Ht 66.0 in | Wt 160.0 lb

## 2023-05-15 DIAGNOSIS — M25562 Pain in left knee: Secondary | ICD-10-CM | POA: Diagnosis not present

## 2023-05-15 DIAGNOSIS — M47817 Spondylosis without myelopathy or radiculopathy, lumbosacral region: Secondary | ICD-10-CM | POA: Diagnosis not present

## 2023-05-15 DIAGNOSIS — M25561 Pain in right knee: Secondary | ICD-10-CM | POA: Diagnosis not present

## 2023-05-15 DIAGNOSIS — G43B0 Ophthalmoplegic migraine, not intractable: Secondary | ICD-10-CM

## 2023-05-15 DIAGNOSIS — M542 Cervicalgia: Secondary | ICD-10-CM | POA: Diagnosis not present

## 2023-05-15 DIAGNOSIS — M79671 Pain in right foot: Secondary | ICD-10-CM | POA: Diagnosis not present

## 2023-05-15 DIAGNOSIS — Z736 Limitation of activities due to disability: Secondary | ICD-10-CM

## 2023-05-15 DIAGNOSIS — Z860101 Personal history of adenomatous and serrated colon polyps: Secondary | ICD-10-CM

## 2023-05-15 DIAGNOSIS — B009 Herpesviral infection, unspecified: Secondary | ICD-10-CM

## 2023-05-15 DIAGNOSIS — M79642 Pain in left hand: Secondary | ICD-10-CM

## 2023-05-15 DIAGNOSIS — G8929 Other chronic pain: Secondary | ICD-10-CM

## 2023-05-15 DIAGNOSIS — M79672 Pain in left foot: Secondary | ICD-10-CM

## 2023-05-15 DIAGNOSIS — R768 Other specified abnormal immunological findings in serum: Secondary | ICD-10-CM

## 2023-05-15 DIAGNOSIS — K449 Diaphragmatic hernia without obstruction or gangrene: Secondary | ICD-10-CM

## 2023-05-15 DIAGNOSIS — E1122 Type 2 diabetes mellitus with diabetic chronic kidney disease: Secondary | ICD-10-CM | POA: Diagnosis not present

## 2023-05-15 DIAGNOSIS — Z85528 Personal history of other malignant neoplasm of kidney: Secondary | ICD-10-CM | POA: Diagnosis not present

## 2023-05-15 DIAGNOSIS — R809 Proteinuria, unspecified: Secondary | ICD-10-CM

## 2023-05-15 DIAGNOSIS — K219 Gastro-esophageal reflux disease without esophagitis: Secondary | ICD-10-CM

## 2023-05-15 DIAGNOSIS — D508 Other iron deficiency anemias: Secondary | ICD-10-CM

## 2023-05-15 DIAGNOSIS — I10 Essential (primary) hypertension: Secondary | ICD-10-CM | POA: Diagnosis not present

## 2023-05-15 DIAGNOSIS — M79641 Pain in right hand: Secondary | ICD-10-CM

## 2023-05-15 DIAGNOSIS — K3189 Other diseases of stomach and duodenum: Secondary | ICD-10-CM

## 2023-05-15 DIAGNOSIS — F330 Major depressive disorder, recurrent, mild: Secondary | ICD-10-CM

## 2023-05-15 DIAGNOSIS — J301 Allergic rhinitis due to pollen: Secondary | ICD-10-CM

## 2023-05-15 DIAGNOSIS — N2581 Secondary hyperparathyroidism of renal origin: Secondary | ICD-10-CM

## 2023-05-15 DIAGNOSIS — G4733 Obstructive sleep apnea (adult) (pediatric): Secondary | ICD-10-CM

## 2023-05-15 DIAGNOSIS — N400 Enlarged prostate without lower urinary tract symptoms: Secondary | ICD-10-CM

## 2023-05-15 DIAGNOSIS — F1290 Cannabis use, unspecified, uncomplicated: Secondary | ICD-10-CM

## 2023-05-15 DIAGNOSIS — E559 Vitamin D deficiency, unspecified: Secondary | ICD-10-CM

## 2023-05-15 DIAGNOSIS — N1831 Chronic kidney disease, stage 3a: Secondary | ICD-10-CM

## 2023-05-15 NOTE — Patient Instructions (Addendum)
 Hand Exercises Hand exercises can be helpful for almost anyone. They can strengthen your hands and improve flexibility and movement. The exercises can also increase blood flow to the hands. These results can make your work and daily tasks easier for you. Hand exercises can be especially helpful for people who have joint pain from arthritis or nerve damage from using their hands over and over. These exercises can also help people who injure a hand. Exercises Most of these hand exercises are gentle stretching and motion exercises. It is usually safe to do them often throughout the day. Warming up your hands before exercise may help reduce stiffness. You can do this with gentle massage or by placing your hands in warm water  for 10-15 minutes. It is normal to feel some stretching, pulling, tightness, or mild discomfort when you begin new exercises. In time, this will improve. Remember to always be careful and stop right away if you feel sudden, very bad pain or your pain gets worse. You want to get better and be safe. Ask your health care provider which exercises are safe for you. Do exercises exactly as told by your provider and adjust them as told. Do not begin these exercises until told by your provider. Knuckle bend or claw fist  Stand or sit with your arm, hand, and all five fingers pointed straight up. Make sure to keep your wrist straight. Gently bend your fingers down toward your palm until the tips of your fingers are touching your palm. Keep your big knuckle straight and only bend the small knuckles in your fingers. Hold this position for 10 seconds. Straighten your fingers back to your starting position. Repeat this exercise 5-10 times with each hand. Full finger fist  Stand or sit with your arm, hand, and all five fingers pointed straight up. Make sure to keep your wrist straight. Gently bend your fingers into your palm until the tips of your fingers are touching the middle of your  palm. Hold this position for 10 seconds. Extend your fingers back to your starting position, stretching every joint fully. Repeat this exercise 5-10 times with each hand. Straight fist  Stand or sit with your arm, hand, and all five fingers pointed straight up. Make sure to keep your wrist straight. Gently bend your fingers at the big knuckle, where your fingers meet your hand, and at the middle knuckle. Keep the knuckle at the tips of your fingers straight and try to touch the bottom of your palm. Hold this position for 10 seconds. Extend your fingers back to your starting position, stretching every joint fully. Repeat this exercise 5-10 times with each hand. Tabletop  Stand or sit with your arm, hand, and all five fingers pointed straight up. Make sure to keep your wrist straight. Gently bend your fingers at the big knuckle, where your fingers meet your hand, as far down as you can. Keep the small knuckles in your fingers straight. Think of forming a tabletop with your fingers. Hold this position for 10 seconds. Extend your fingers back to your starting position, stretching every joint fully. Repeat this exercise 5-10 times with each hand. Finger spread  Place your hand flat on a table with your palm facing down. Make sure your wrist stays straight. Spread your fingers and thumb apart from each other as far as you can until you feel a gentle stretch. Hold this position for 10 seconds. Bring your fingers and thumb tight together again. Hold this position for 10 seconds. Repeat  this exercise 5-10 times with each hand. Making circles  Stand or sit with your arm, hand, and all five fingers pointed straight up. Make sure to keep your wrist straight. Make a circle by touching the tip of your thumb to the tip of your index finger. Hold for 10 seconds. Then open your hand wide. Repeat this motion with your thumb and each of your fingers. Repeat this exercise 5-10 times with each hand. Thumb  motion  Sit with your forearm resting on a table and your wrist straight. Your thumb should be facing up toward the ceiling. Keep your fingers relaxed as you move your thumb. Lift your thumb up as high as you can toward the ceiling. Hold for 10 seconds. Bend your thumb across your palm as far as you can, reaching the tip of your thumb for the small finger (pinkie) side of your palm. Hold for 10 seconds. Repeat this exercise 5-10 times with each hand. Grip strengthening  Hold a stress ball or other soft ball in the middle of your hand. Slowly increase the pressure, squeezing the ball as much as you can without causing pain. Think of bringing the tips of your fingers into the middle of your palm. All of your finger joints should bend when doing this exercise. Hold your squeeze for 10 seconds, then relax. Repeat this exercise 5-10 times with each hand. Contact a health care provider if: Your hand pain or discomfort gets much worse when you do an exercise. Your hand pain or discomfort does not improve within 2 hours after you exercise. If you have either of these problems, stop doing these exercises right away. Do not do them again unless your provider says that you can. Get help right away if: You develop sudden, severe hand pain or swelling. If this happens, stop doing these exercises right away. Do not do them again unless your provider says that you can. This information is not intended to replace advice given to you by your health care provider. Make sure you discuss any questions you have with your health care provider. Document Revised: 05/07/2022 Document Reviewed: 05/07/2022 [employer] Patient Education  2024 [employer] Exercises for Chronic Knee Pain Chronic knee pain is pain that lasts longer than 3 months. For most people with chronic knee pain, exercise and weight loss is an important part of treatment. Your health care provider may want you to focus on: Making the muscles that  support your knee stronger. This can take pressure off your knee and reduce pain. Preventing knee stiffness. How far you can move your knee, keeping it there or making it farther. Losing weight (if this applies) to take pressure off your knee, lower your risk for injury, and make it easier for you to exercise. Your provider will help you make an exercise program that fits your needs and physical abilities. Below are simple, low-impact exercises you can do at home. Ask your provider or physical therapist how often you should do your exercise program and how many times to repeat each exercise. General safety tips  Get your provider's approval before doing any exercises. Start slowly and stop any time you feel pain. Do not exercise if your knee pain is flaring up. Warm up first. Stretching a cold muscle can cause an injury. Do 5-10 minutes of easy movement or light stretching before beginning your exercises. Do 5-10 minutes of low-impact activity (like walking or cycling) before starting strengthening exercises. Contact your provider any time you have pain during  or after exercising. Exercise can cause discomfort but should not be painful. It is normal to be a little stiff or sore after exercising. Stretching and range-of-motion exercises Front thigh stretch  Stand up straight and support your body by holding on to a chair or resting one hand on a wall. With your legs straight and close together, bend one knee to lift your heel up toward your butt. Using one hand for support, grab your ankle with your free hand. Pull your foot up closer toward your butt to feel the stretch in front of your thigh. Hold the stretch for 30 seconds. Repeat __________ times. Complete this exercise __________ times a day. Back thigh stretch  Sit on the floor with your back straight and your legs out straight in front of you. Place the palms of your hands on the floor and slide them toward your feet as you bend at the  hip. Try to touch your nose to your knees and feel the stretch in the back of your thighs. Hold for 30 seconds. Repeat __________ times. Complete this exercise __________ times a day. Calf stretch  Stand facing a wall. Place the palms of your hands flat against the wall, arms extended, and lean slightly against the wall. Get into a lunge position with one leg bent at the knee and the other leg stretched out straight behind you. Keep both feet facing the wall and increase the bend in your knee while keeping the heel of the other leg flat on the ground. You should feel the stretch in your calf. Hold for 30 seconds. Repeat __________ times. Complete this exercise __________ times a day. Strengthening exercises Straight leg lift  Lie on your back with one knee bent and the other leg out straight. Slowly lift the straight leg without bending the knee. Lift until your foot is about 12 inches (30 cm) off the floor. Hold for 3-5 seconds and slowly lower your leg. Repeat __________ times. Complete this exercise __________ times a day. Single leg dip  Stand between two chairs and put both hands on the backs of the chairs for support. Extend one leg out straight with your body weight resting on the heel of the standing leg. Slowly bend your standing knee to dip your body to the level that is comfortable for you. Hold for 3-5 seconds. Repeat __________ times. Complete this exercise __________ times a day. Hamstring curls  Stand straight, knees close together, facing the back of a chair. Hold on to the back of a chair with both hands. Keep one leg straight. Bend the other knee while bringing the heel up toward the butt until the knee is bent at a 90-degree angle (right angle). Hold for 3-5 seconds. Repeat __________ times. Complete this exercise __________ times a day. Wall squat  Stand straight with your back, hips, and head against a wall. Step forward one foot at a time with your back  still against the wall. Your feet should be 2 feet (61 cm) from the wall at shoulder width. Keeping your back, hips, and head against the wall, slide down the wall to as close to a sitting position as you can get. Hold for 5-10 seconds, then slowly slide back up. Repeat __________ times. Complete this exercise __________ times a day. Step-ups  Stand in front of a sturdy platform or stool that is about 6 inches (15 cm) high. Slowly step up with your left / right foot, keeping your knee in line with your hip  and foot. Do not let your knee bend so far that you cannot see your toes. Hold on to a chair for balance, but do not use it for support. Slowly unlock your knee and lower yourself to the starting position. Repeat __________ times. Complete this exercise __________ times a day. Contact a health care provider if: Your exercises cause pain. Your pain is worse after you exercise. Your pain prevents you from doing your exercises. This information is not intended to replace advice given to you by your health care provider. Make sure you discuss any questions you have with your health care provider. Document Revised: 05/07/2022 Document Reviewed: 05/07/2022 [employer] Patient Education  2024 [employer] Low Back Sprain or Strain Rehab Ask your health care provider which exercises are safe for you. Do exercises exactly as told by your health care provider and adjust them as directed. It is normal to feel mild stretching, pulling, tightness, or discomfort as you do these exercises. Stop right away if you feel sudden pain or your pain gets worse. Do not begin these exercises until told by your health care provider. Stretching and range-of-motion exercises These exercises warm up your muscles and joints and improve the movement and flexibility of your back. These exercises also help to relieve pain, numbness, and tingling. Lumbar rotation  Lie on your back on a firm bed or the floor with your knees  bent. Straighten your arms out to your sides so each arm forms a 90-degree angle (right angle) with a side of your body. Slowly move (rotate) both of your knees to one side of your body until you feel a stretch in your lower back (lumbar). Try not to let your shoulders lift off the floor. Hold this position for __________ seconds. Tense your abdominal muscles and slowly move your knees back to the starting position. Repeat this exercise on the other side of your body. Repeat __________ times. Complete this exercise __________ times a day. Single knee to chest  Lie on your back on a firm bed or the floor with both legs straight. Bend one of your knees. Use your hands to move your knee up toward your chest until you feel a gentle stretch in your lower back and buttock. Hold your leg in this position by holding on to the front of your knee. Keep your other leg as straight as possible. Hold this position for __________ seconds. Slowly return to the starting position. Repeat with your other leg. Repeat __________ times. Complete this exercise __________ times a day. Prone extension on elbows  Lie on your abdomen on a firm bed or the floor (prone position). Prop yourself up on your elbows. Use your arms to help lift your chest up until you feel a gentle stretch in your abdomen and your lower back. This will place some of your body weight on your elbows. If this is uncomfortable, try stacking pillows under your chest. Your hips should stay down, against the surface that you are lying on. Keep your hip and back muscles relaxed. Hold this position for __________ seconds. Slowly relax your upper body and return to the starting position. Repeat __________ times. Complete this exercise __________ times a day. Strengthening exercises These exercises build strength and endurance in your back. Endurance is the ability to use your muscles for a long time, even after they get tired. Pelvic tilt This  exercise strengthens the muscles that lie deep in the abdomen. Lie on your back on a firm bed or the  floor with your legs extended. Bend your knees so they are pointing toward the ceiling and your feet are flat on the floor. Tighten your lower abdominal muscles to press your lower back against the floor. This motion will tilt your pelvis so your tailbone points up toward the ceiling instead of pointing to your feet or the floor. To help with this exercise, you may place a small towel under your lower back and try to push your back into the towel. Hold this position for __________ seconds. Let your muscles relax completely before you repeat this exercise. Repeat __________ times. Complete this exercise __________ times a day. Alternating arm and leg raises  Get on your hands and knees on a firm surface. If you are on a hard floor, you may want to use padding, such as an exercise mat, to cushion your knees. Line up your arms and legs. Your hands should be directly below your shoulders, and your knees should be directly below your hips. Lift your left leg behind you. At the same time, raise your right arm and straighten it in front of you. Do not lift your leg higher than your hip. Do not lift your arm higher than your shoulder. Keep your abdominal and back muscles tight. Keep your hips facing the ground. Do not arch your back. Keep your balance carefully, and do not hold your breath. Hold this position for __________ seconds. Slowly return to the starting position. Repeat with your right leg and your left arm. Repeat __________ times. Complete this exercise __________ times a day. Abdominal set with straight leg raise  Lie on your back on a firm bed or the floor. Bend one of your knees and keep your other leg straight. Tense your abdominal muscles and lift your straight leg up, 4-6 inches (10-15 cm) off the ground. Keep your abdominal muscles tight and hold this position for __________  seconds. Do not hold your breath. Do not arch your back. Keep it flat against the ground. Keep your abdominal muscles tense as you slowly lower your leg back to the starting position. Repeat with your other leg. Repeat __________ times. Complete this exercise __________ times a day. Single leg lower with bent knees Lie on your back on a firm bed or the floor. Tense your abdominal muscles and lift your feet off the floor, one foot at a time, so your knees and hips are bent in 90-degree angles (right angles). Your knees should be over your hips and your lower legs should be parallel to the floor. Keeping your abdominal muscles tense and your knee bent, slowly lower one of your legs so your toe touches the ground. Lift your leg back up to return to the starting position. Do not hold your breath. Do not let your back arch. Keep your back flat against the ground. Repeat with your other leg. Repeat __________ times. Complete this exercise __________ times a day. Posture and body mechanics Good posture and healthy body mechanics can help to relieve stress in your body's tissues and joints. Body mechanics refers to the movements and positions of your body while you do your daily activities. Posture is part of body mechanics. Good posture means: Your spine is in its natural S-curve position (neutral). Your shoulders are pulled back slightly. Your head is not tipped forward (neutral). Follow these guidelines to improve your posture and body mechanics in your everyday activities. Standing  When standing, keep your spine neutral and your feet about hip-width apart. Keep a  slight bend in your knees. Your ears, shoulders, and hips should line up. When you do a task in which you stand in one place for a long time, place one foot up on a stable object that is 2-4 inches (5-10 cm) high, such as a footstool. This helps keep your spine neutral. Sitting  When sitting, keep your spine neutral and keep your  feet flat on the floor. Use a footrest, if necessary, and keep your thighs parallel to the floor. Avoid rounding your shoulders, and avoid tilting your head forward. When working at a desk or a computer, keep your desk at a height where your hands are slightly lower than your elbows. Slide your chair under your desk so you are close enough to maintain good posture. When working at a computer, place your monitor at a height where you are looking straight ahead and you do not have to tilt your head forward or downward to look at the screen. Resting When lying down and resting, avoid positions that are most painful for you. If you have pain with activities such as sitting, bending, stooping, or squatting, lie in a position in which your body does not bend very much. For example, avoid curling up on your side with your arms and knees near your chest (fetal position). If you have pain with activities such as standing for a long time or reaching with your arms, lie with your spine in a neutral position and bend your knees slightly. Try the following positions: Lying on your side with a pillow between your knees. Lying on your back with a pillow under your knees. Lifting  When lifting objects, keep your feet at least shoulder-width apart and tighten your abdominal muscles. Bend your knees and hips and keep your spine neutral. It is important to lift using the strength of your legs, not your back. Do not lock your knees straight out. Always ask for help to lift heavy or awkward objects. This information is not intended to replace advice given to you by your health care provider. Make sure you discuss any questions you have with your health care provider. Document Revised: 08/26/2022 Document Reviewed: 07/10/2020 [employer] Patient Education  2024 [employer] Osteoarthritis  Osteoarthritis is a type of arthritis. It refers to joint pain or joint disease. Osteoarthritis affects tissue that covers the ends  of bones in joints (cartilage). Cartilage acts as a cushion between the bones and helps them move smoothly. Osteoarthritis occurs when cartilage in the joints gets worn down. Osteoarthritis is sometimes called wear and tear arthritis. Osteoarthritis is the most common form of arthritis. It often occurs in older people. It is a condition that gets worse over time. The joints most often affected by this condition are in the fingers, toes, hips, knees, and spine, including the neck and lower back. What are the causes? This condition is caused by the wearing down of cartilage that covers the ends of bones. What increases the risk? The following factors may make you more likely to develop this condition: Being age 42 or older. Obesity. Overuse of joints. Past injury of a joint. Past surgery on a joint. Family history of osteoarthritis. What are the signs or symptoms? The main symptoms of this condition are pain, swelling, and stiffness in the joint. Other symptoms may include: An enlarged joint. More pain and further damage caused by small pieces of bone or cartilage that break off and float inside of the joint. Small deposits of bone (osteophytes) that grow  on the edges of the joint. A grating or scraping feeling inside the joint when you move it. Popping or creaking sounds when you move. Difficulty walking or exercising. An inability to grip items, twist your hand, or control the movements of your hands and fingers. How is this diagnosed? This condition may be diagnosed based on: Your medical history. A physical exam. Your symptoms. X-rays of the affected joints. Blood tests to rule out other types of arthritis. How is this treated? There is no cure for this condition, but treatment can help control pain and improve joint function. Treatment may include a combination of therapies, such as: Pain relief techniques, such as: Applying heat and cold to the joint. Massage. A form of talk  therapy called cognitive behavioral therapy (CBT). This therapy helps you set goals and follow up on the changes that you make. Medicines for pain and inflammation. The medicines can be taken by mouth or applied to the skin. They include: NSAIDs, such as ibuprofen. Prescription medicines. Strong anti-inflammatory medicines (corticosteroids). Certain nutritional supplements. A prescribed exercise program. You may work with a physical therapist. Assistive devices, such as a brace, wrap, splint, specialized glove, or cane. A weight control plan. Surgery, such as: An osteotomy. This is done to reposition the bones and relieve pain or to remove loose pieces of bone and cartilage. Joint replacement surgery. You may need this surgery if you have advanced osteoarthritis. Follow these instructions at home: Activity Rest your affected joints as told by your health care provider. Exercise as told by your provider. The provider may recommend specific types of exercise, such as: Strengthening exercises. These are done to strengthen the muscles that support joints affected by arthritis. Aerobic activities. These are exercises, such as brisk walking or water  aerobics, that increase your heart rate. Range-of-motion activities. These help your joints move more easily. Balance and agility exercises. Managing pain, stiffness, and swelling     If told, apply heat to the affected area as often as told by your provider. Use the heat source that your provider recommends, such as a moist heat pack or a heating pad. If you have a removable assistive device, remove it as told by your provider. Place a towel between your skin and the heat source. If your provider tells you to keep the assistive device on while you apply heat, place a towel between the assistive device and the heat source. Leave the heat on for 20-30 minutes. If told, put ice on the affected area. If you have a removable assistive device, remove it  as told by your provider. Put ice in a plastic bag. Place a towel between your skin and the bag. If your provider tells you to keep the assistive device on during icing, place a towel between the assistive device and the bag. Leave the ice on for 20 minutes, 2-3 times a day. If your skin turns bright red, remove the ice or heat right away to prevent skin damage. The risk of damage is higher if you cannot feel pain, heat, or cold. Move your fingers or toes often to reduce stiffness and swelling. Raise (elevate) the affected area above the level of your heart while you are sitting or lying down. General instructions Take over-the-counter and prescription medicines only as told by your provider. Maintain a healthy weight. Follow instructions from your provider for weight control. Do not use any products that contain nicotine or tobacco. These products include cigarettes, chewing tobacco, and vaping devices, such as e-cigarettes.  If you need help quitting, ask your provider. Use assistive devices as told by your provider. Where to find more information [employer] of Arthritis and Musculoskeletal and Skin Diseases: niams.http://www.myers.net/ [employer] on Aging: baseringtones.pl American College of Rheumatology: rheumatology.org Contact a health care provider if: You have redness, swelling, or a feeling of warmth in a joint that gets worse. You have a fever along with joint or muscle aches. You develop a rash. You have trouble doing your normal activities. You have pain that gets worse and is not relieved by pain medicine. This information is not intended to replace advice given to you by your health care provider. Make sure you discuss any questions you have with your health care provider. Document Revised: 12/20/2021 Document Reviewed: 12/20/2021 Elsevier Patient Education  2024 [employer].

## 2023-05-21 ENCOUNTER — Encounter: Payer: Self-pay | Admitting: Gastroenterology

## 2023-05-25 ENCOUNTER — Other Ambulatory Visit: Payer: Self-pay

## 2023-05-25 ENCOUNTER — Encounter (HOSPITAL_BASED_OUTPATIENT_CLINIC_OR_DEPARTMENT_OTHER): Payer: Self-pay | Admitting: Emergency Medicine

## 2023-05-25 ENCOUNTER — Emergency Department (HOSPITAL_BASED_OUTPATIENT_CLINIC_OR_DEPARTMENT_OTHER)
Admission: EM | Admit: 2023-05-25 | Discharge: 2023-05-25 | Disposition: A | Discharge status: Home / Self Care | Payer: 59 | Attending: Emergency Medicine | Admitting: Emergency Medicine

## 2023-05-25 DIAGNOSIS — I10 Essential (primary) hypertension: Secondary | ICD-10-CM | POA: Insufficient documentation

## 2023-05-25 DIAGNOSIS — Z79899 Other long term (current) drug therapy: Secondary | ICD-10-CM | POA: Insufficient documentation

## 2023-05-25 DIAGNOSIS — L02211 Cutaneous abscess of abdominal wall: Secondary | ICD-10-CM | POA: Diagnosis not present

## 2023-05-25 DIAGNOSIS — Z85528 Personal history of other malignant neoplasm of kidney: Secondary | ICD-10-CM | POA: Diagnosis not present

## 2023-05-25 MED ORDER — SULFAMETHOXAZOLE-TRIMETHOPRIM 800-160 MG PO TABS: 1.0000 | ORAL_TABLET | Freq: Two times a day (BID) | ORAL | 0 refills | Status: AC

## 2023-05-25 MED ORDER — LIDOCAINE-EPINEPHRINE-TETRACAINE (LET) TOPICAL GEL: 3.0000 mL | Freq: Once | TOPICAL | Status: DC

## 2023-05-25 MED ORDER — LIDOCAINE-EPINEPHRINE (PF) 2 %-1:200000 IJ SOLN
10.0000 mL | Freq: Once | INTRAMUSCULAR | Status: AC
Start: 2023-05-25 — End: 2023-05-25
  Administered 2023-05-25: 10 mL
  Filled 2023-05-25: qty 20

## 2023-05-25 MED ORDER — BENZOCAINE 20 % MT AERO
INHALATION_SPRAY | Freq: Once | OROMUCOSAL | Status: AC
  Administered 2023-05-25: 1 via OROMUCOSAL
  Filled 2023-05-25: qty 57

## 2023-05-25 MED ORDER — CEPHALEXIN 500 MG PO CAPS: 500.0000 mg | ORAL_CAPSULE | Freq: Two times a day (BID) | ORAL | 0 refills | Status: AC

## 2023-05-25 MED ORDER — LIDOCAINE-PRILOCAINE 2.5-2.5 % EX CREA: TOPICAL_CREAM | Freq: Once | CUTANEOUS | Status: DC

## 2023-05-25 NOTE — ED Provider Notes (Signed)
[facility] AT [facility] Provider Note   CSN: [id number] Arrival date & time: 05/25/23  1104     History  Chief Complaint  Patient presents with   Abscess    Brandon Lawrence is a 63 y.o. male.  With a history of chronic abdominal pain, GERD, anxiety, depression, hypertension, renal cell carcinoma presenting to the ED for evaluation of an abscess.  Localized to the left lower quadrant of the abdomen.  He states he noticed this approximately 6 days ago.  Area has continuously increased in size and worsened in pain.  No fevers or chills, nausea or vomiting.  No dysuria, frequency, urgency.   Abscess      Home Medications Prior to Admission medications   Medication Sig Start Date End Date Taking? Authorizing Provider  cephALEXin (KEFLEX) 500 MG capsule Take 1 capsule (500 mg total) by mouth 2 (two) times daily for 7 days. 05/25/23 06/01/23 Yes Eliana Lueth, Edsel Petrin, PA-C  sulfamethoxazole-trimethoprim (BACTRIM DS) 800-160 MG tablet Take 1 tablet by mouth 2 (two) times daily for 7 days. 05/25/23 06/01/23 Yes Suttyn Cryder, Edsel Petrin, PA-C  amLODipine (NORVASC) 10 MG tablet Take 1 tablet (10 mg total) by mouth daily. 01/24/23   [name], MD  chlorthalidone (HYGROTON) 25 MG tablet Take 25 mg by mouth daily. Take one half tablet by mouth daily    [provider]  dexlansoprazole (DEXILANT) 60 MG capsule TAKE 1 CAPSULE BY MOUTH DAILY 12/04/22   [name], MD  doxazosin (CARDURA) 4 MG tablet TAKE 1 TABLET BY MOUTH DAILY 01/28/23   [name], MD  DULoxetine (CYMBALTA) 30 MG capsule TAKE 1 CAPSULE BY MOUTH DAILY 04/17/23   [name], MD  famotidine (PEPCID) 20 MG tablet Take 1 tablet (20 mg total) by mouth at bedtime. 09/03/22   Tiffany Kocher, PA-C  ferrous sulfate 325 (65 FE) MG EC tablet Take 325 mg by mouth daily with breakfast.    [provider]  LINZESS 72 MCG capsule Take 72 mcg by mouth every morning. 03/14/23    [provider]  lubiprostone (AMITIZA) 8 MCG capsule TAKE ONE CAPSULE ONCE TO TWICE DAILY WITH FOOD FOR CONSTIPATION. 03/17/23   Tiffany Kocher, PA-C  ondansetron (ZOFRAN) 4 MG tablet Take 1 tablet (4 mg total) by mouth every 8 (eight) hours as needed for nausea or vomiting. Patient not taking: Reported on 05/15/2023 12/25/22   Tiffany Kocher, PA-C  rosuvastatin (CRESTOR) 5 MG tablet Take 1 tablet (5 mg total) by mouth daily. 01/31/23   Christell Constant, MD  sildenafil (VIAGRA) 100 MG tablet Take 0.5-1 tablets (50-100 mg total) by mouth daily as needed for erectile dysfunction. 05/05/23   [name], MD  sucralfate (CARAFATE) 1 g tablet TAKE 1 TABLET BY MOUTH IN THE  MORNING AT NOON, AND AT BEDTIME 05/06/23   [name], MD  valsartan (DIOVAN) 80 MG tablet TAKE 1 TABLET BY MOUTH DAILY 04/01/23   [name], MD      Allergies    Aspirin and Nsaids    Review of Systems   Review of Systems  Skin:  Positive for wound.  All other systems reviewed and are negative.   Physical Exam Updated Vital Signs BP (!) 113/43 (BP Location: Left Arm)   Pulse (!) 54   Temp 98.4 F (36.9 C) (Oral)   Resp 18   SpO2 97%  Physical Exam Vitals and nursing note reviewed.  Constitutional:  General: He is not in acute distress.    Appearance: Normal appearance. He is normal weight. He is not ill-appearing.  HENT:     Head: Normocephalic and atraumatic.  Pulmonary:     Effort: Pulmonary effort is normal. No respiratory distress.  Abdominal:     General: Abdomen is flat.       Comments: 3 cm x 3 cm area of induration with overlying erythema to the left lower quadrant.  No central fluctuance.  Mild surrounding erythema.  Musculoskeletal:        General: Normal range of motion.     Cervical back: Neck supple.  Skin:    General: Skin is warm and dry.  Neurological:     Mental Status: He is alert and oriented to person, place, and time.  Psychiatric:        Mood  and Affect: Mood normal.        Behavior: Behavior normal.     ED Results / Procedures / Treatments   Labs (all labs ordered are listed, but only abnormal results are displayed) Labs Reviewed - No data to display  EKG None  Radiology No results found.  Procedures .Incision and Drainage  Date/Time: 05/25/2023 12:21 PM  Performed by: Michelle Piper, PA-C Authorized by: Michelle Piper, PA-C   Consent:    Consent obtained:  Verbal   Consent given by:  Patient   Risks discussed:  Bleeding, incomplete drainage, pain and damage to other organs   Alternatives discussed:  No treatment Universal protocol:    Procedure explained and questions answered to patient or proxy's satisfaction: yes     Relevant documents present and verified: yes     Test results available : yes     Required blood products, implants, devices, and special equipment available: yes     Site/side marked: yes     Immediately prior to procedure, a time out was called: yes     Patient identity confirmed:  Verbally with patient Location:    Type:  Abscess   Location:  Trunk   Trunk location:  Abdomen Pre-procedure details:    Skin preparation:  Betadine and chlorhexidine with alcohol Anesthesia:    Anesthesia method:  Local infiltration   Local anesthetic:  Lidocaine 2% WITH epi Procedure type:    Complexity:  Simple Procedure details:    Incision types:  Single straight   Incision depth:  Subcutaneous   Wound management:  Probed and deloculated and irrigated with saline   Drainage:  Purulent   Drainage amount:  Moderate   Wound treatment:  Wound left open Post-procedure details:    Procedure completion:  Tolerated well, no immediate complications     Medications Ordered in ED Medications  lidocaine-EPINEPHrine (XYLOCAINE W/EPI) 2 %-1:200000 (PF) injection 10 mL (has no administration in time range)  Benzocaine (HURRCAINE) 20 % mouth spray (has no administration in time range)    ED  Course/ Medical Decision Making/ A&P                                 Medical Decision Making This patient presents to the ED for concern of abscess, this involves an extensive number of treatment options, and is a complaint that carries with it a high risk of complications and morbidity.  The differential diagnosis includes abscess, cellulitis  My initial workup includes I&D  Additional history obtained from: Nursing notes from this visit.  Afebrile, hemodynamically stable.  63 year old male presenting to the ED for evaluation of abdominal wall abscess.  He noticed it 6 days ago.  Has progressively gotten larger and more painful.  No systemic signs or symptoms of infection.  No urologic or bowel complaints.  On exam, there is appear to be a 3 cm x 3 cm abscess to the left lower quadrant.  This is confirmed by bedside ultrasound.  Incision and drainage was performed.  Wound was then probed, deloculated and irrigated.  Wound was left open.  He will be started on Bactrim and Keflex.  He was encouraged to follow-up with his primary care provider.  He was given return precautions.  Stable at discharge.  At this time there does not appear to be any evidence of an acute emergency medical condition and the patient appears stable for discharge with appropriate outpatient follow up. Diagnosis was discussed with patient who verbalizes understanding of care plan and is agreeable to discharge. I have discussed return precautions with patient who verbalizes understanding. Patient encouraged to follow-up with their PCP within 1 week. All questions answered.  Patient's case discussed with Dr. Andria Meuse who agrees with plan to discharge with follow-up.   Note: Portions of this report may have been transcribed using voice recognition software. Every effort was made to ensure accuracy; however, inadvertent computerized transcription errors may still be present.         Final Clinical Impression(s) / ED  Diagnoses Final diagnoses:  Abdominal wall abscess    Rx / DC Orders ED Discharge Orders          Ordered    sulfamethoxazole-trimethoprim (BACTRIM DS) 800-160 MG tablet  2 times daily        05/25/23 1218    cephALEXin (KEFLEX) 500 MG capsule  2 times daily        05/25/23 1218              Michelle Piper, PA-C 05/25/23 1221    Anders Simmonds T, DO 05/28/23 [phone]

## 2023-05-25 NOTE — ED Triage Notes (Signed)
Wound/ abscess on abdomin. Red swollen and painful Noticed Monday Denies fever

## 2023-05-25 NOTE — Discharge Instructions (Signed)
You have been seen today for your complaint of abdominal wall abscess. Your discharge medications include Bactrim and Keflex. This is an antibiotic. You should take it as prescribed. You should take it for the entire duration of the prescription. This may cause an upset stomach. This is normal. You may take this with food. You may also eat yogurt to prevent diarrhea. Home care instructions are as follows:  Keep the area clean and dry.  Allow for drainage.  Do not submerge the area in water until it is fully healed which may take 2 weeks Follow up with: Your primary care provider Please seek immediate medical care if you develop any of the following symptoms: You have severe pain. You make less pee (urine) than normal. At this time there does not appear to be the presence of an emergent medical condition, however there is always the potential for conditions to change. Please read and follow the below instructions.  Do not take your medicine if  develop an itchy rash, swelling in your mouth or lips, or difficulty breathing; call 911 and seek immediate emergency medical attention if this occurs.  You may review your lab tests and imaging results in their entirety on your MyChart account.  Please discuss all results of fully with your primary care provider and other specialist at your follow-up visit.  Note: Portions of this text may have been transcribed using voice recognition software. Every effort was made to ensure accuracy; however, inadvertent computerized transcription errors may still be present.

## 2023-05-29 ENCOUNTER — Ambulatory Visit (INDEPENDENT_AMBULATORY_CARE_PROVIDER_SITE_OTHER): Payer: 59 | Admitting: Family Medicine

## 2023-05-29 ENCOUNTER — Encounter: Payer: Self-pay | Admitting: Family Medicine

## 2023-05-29 VITALS — BP 118/62 | HR 58 | Temp 98.0°F | Ht 66.0 in | Wt 160.0 lb

## 2023-05-29 DIAGNOSIS — L02211 Cutaneous abscess of abdominal wall: Secondary | ICD-10-CM
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
## 2023-06-08 ENCOUNTER — Other Ambulatory Visit: Payer: Self-pay | Admitting: Family Medicine

## 2023-06-10 ENCOUNTER — Ambulatory Visit (INDEPENDENT_AMBULATORY_CARE_PROVIDER_SITE_OTHER): Payer: 59 | Admitting: Gastroenterology

## 2023-06-10 ENCOUNTER — Encounter: Payer: Self-pay | Admitting: Gastroenterology

## 2023-06-10 VITALS — BP 131/58 | HR 75 | Temp 97.9°F | Ht 65.0 in | Wt 157.8 lb

## 2023-06-10 DIAGNOSIS — D649 Anemia, unspecified: Secondary | ICD-10-CM

## 2023-06-10 DIAGNOSIS — K5900 Constipation, unspecified: Secondary | ICD-10-CM

## 2023-06-10 DIAGNOSIS — K219 Gastro-esophageal reflux disease without esophagitis: Secondary | ICD-10-CM | POA: Diagnosis not present

## 2023-06-10 DIAGNOSIS — R1013 Epigastric pain: Secondary | ICD-10-CM

## 2023-06-10 DIAGNOSIS — D509 Iron deficiency anemia, unspecified: Secondary | ICD-10-CM

## 2023-06-10 NOTE — Patient Instructions (Addendum)
 Continue Dexilant  60mg  daily before breakfast for acid reflux. Try come off famotidine . You may no longer need it. If you notice recurrent abdominal pain, burning, heartburn, then please call us  and also start back on famotidine . Continue to follow with PCP and nephrologist regarding your mild anemia. If any significant drop in hemoglobin, worsening anemia, blood in stool, please let us  know. Otherwise, we will see you back in one year.

## 2023-06-10 NOTE — Progress Notes (Signed)
 GI Office Note    Referring Provider: Duanne Butler DASEN, MD Primary Care Physician:  Duanne Butler DASEN, MD  Primary Gastroenterologist: Ozell Hollingshead, MD   Chief Complaint   Chief Complaint  Patient presents with   Follow-up    Doing well, no issues    History of Present Illness   Brandon Lawrence is a 63 y.o. male presenting today for follow-up.  Last seen November 2024.  History of GERD, constipation, dyspepsia, anemia.  Previous history of IDA with resolution, but persistent normocytic anemia likely anemia of chronic disease in the setting of CKD.  Heme-negative stool June 2024.  Colonoscopy 2022: normal. Repeat 5 years.   EGD 2022: normal esophagus s/p empiric dilation, normal stomach/duodenum  Labs from November 2024: Hemoglobin stable at 12.8, hematocrit normal at 39.1, iron  83, TIBC 295, iron  sat 28%, ferritin 135, B12 761, folate 16.3.  Today: Doing well.  No issues with abdominal pain, constipation, diarrhea, rectal bleeding, heartburn, dysphagia.  Stools are dark on iron .  Medications   Current Outpatient Medications  Medication Sig Dispense Refill   amLODipine  (NORVASC ) 10 MG tablet Take 1 tablet (10 mg total) by mouth daily. 90 tablet 3   chlorthalidone (HYGROTON) 25 MG tablet Take 25 mg by mouth daily. Take one half tablet by mouth daily     dexlansoprazole  (DEXILANT ) 60 MG capsule TAKE 1 CAPSULE BY MOUTH DAILY 100 capsule 2   doxazosin  (CARDURA ) 4 MG tablet TAKE 1 TABLET BY MOUTH DAILY 80 tablet 3   DULoxetine  (CYMBALTA ) 30 MG capsule TAKE 1 CAPSULE BY MOUTH DAILY 90 capsule 3   famotidine  (PEPCID ) 20 MG tablet Take 1 tablet (20 mg total) by mouth at bedtime. 90 tablet 5   ferrous sulfate  325 (65 FE) MG EC tablet Take 325 mg by mouth daily with breakfast.     lubiprostone  (AMITIZA ) 8 MCG capsule TAKE ONE CAPSULE ONCE TO TWICE DAILY WITH FOOD FOR CONSTIPATION. 180 capsule 3   ondansetron  (ZOFRAN ) 4 MG tablet Take 1 tablet (4 mg total) by mouth every 8  (eight) hours as needed for nausea or vomiting. 20 tablet 0   rosuvastatin  (CRESTOR ) 5 MG tablet Take 1 tablet (5 mg total) by mouth daily. 90 tablet 3   sildenafil  (VIAGRA ) 100 MG tablet Take 0.5-1 tablets (50-100 mg total) by mouth daily as needed for erectile dysfunction. 5 tablet 11   sucralfate  (CARAFATE ) 1 g tablet TAKE 1 TABLET BY MOUTH IN THE  MORNING AT NOON, AND AT BEDTIME 300 tablet 2   valsartan  (DIOVAN ) 80 MG tablet TAKE 1 TABLET BY MOUTH DAILY 100 tablet 2   No current facility-administered medications for this visit.    Allergies   Allergies as of 06/10/2023 - Review Complete 06/10/2023  Allergen Reaction Noted   Aspirin  Other (See Comments) 09/21/2007   Nsaids Other (See Comments) 07/28/2020     Review of Systems   General: Negative for anorexia, weight loss, fever, chills, fatigue, weakness. ENT: Negative for hoarseness, difficulty swallowing , nasal congestion. CV: Negative for chest pain, angina, palpitations, dyspnea on exertion, peripheral edema.  Respiratory: Negative for dyspnea at rest, dyspnea on exertion, cough, sputum, wheezing.  GI: See history of present illness. GU:  Negative for dysuria, hematuria, urinary incontinence, urinary frequency, nocturnal urination.  Endo: Negative for unusual weight change.     Physical Exam   BP (!) 131/58 (BP Location: Right Arm, Patient Position: Sitting, Cuff Size: Normal)   Pulse 75   Temp 97.9 F (  36.6 C) (Oral)   Ht 5' 5 (1.651 m)   Wt 157 lb 12.8 oz (71.6 kg)   SpO2 95%   BMI 26.26 kg/m    General: Well-nourished, well-developed in no acute distress.  Eyes: No icterus. Mouth: Oropharyngeal mucosa moist and pink  Abdomen: Bowel sounds are normal, nontender, nondistended, no hepatosplenomegaly or masses,  no abdominal bruits or hernia , no rebound or guarding.  Rectal: not performed  Extremities: No lower extremity edema. No clubbing or deformities. Neuro: Alert and oriented x 4   Skin: Warm and dry, no  jaundice.   Psych: Alert and cooperative, normal mood and affect.  Labs   Lab Results  Component Value Date   NA 140 05/05/2023   CL 102 05/05/2023   K 3.9 05/05/2023   CO2 29 05/05/2023   BUN 24 05/05/2023   CREATININE 1.63 (H) 05/05/2023   EGFR 48 (L) 05/05/2023   CALCIUM  8.7 05/05/2023   ALBUMIN 4.4 10/20/2021   GLUCOSE 124 (H) 05/05/2023   Lab Results  Component Value Date   WBC 5.4 03/13/2023   HGB 12.8 (L) 03/13/2023   HCT 39.1 03/13/2023   MCV 91.8 03/13/2023   PLT 226 03/13/2023   Lab Results  Component Value Date   VITAMINB12 761 03/13/2023   Lab Results  Component Value Date   IRON  83 03/13/2023   TIBC 295 03/13/2023   FERRITIN 135 03/13/2023   Lab Results  Component Value Date   VITAMINB12 761 03/13/2023   Lab Results  Component Value Date   FOLATE 16.3 03/13/2023    Imaging Studies   XR Foot 2 Views Left Result Date: 05/15/2023 PIP and DIP narrowing was noted.  No MTP, intertarsal, tibiotalar or subtalar joint space narrowing was noted.  No erosive changes were noted. Impression: These findings are suggestive of osteoarthritis of the foot.  XR Foot 2 Views Right Result Date: 05/15/2023 Mild PIP and DIP narrowing was noted.  No significant MTP narrowing was noted.  No intertarsal, tibiotalar or subtalar joint space narrowing was noted.  Posterior calcaneal spur was noted.  No erosive changes were noted. Impression: These findings suggestive of osteoarthritis of the foot.  XR Hand 2 View Left Result Date: 05/15/2023 CMC, first MCP, PIP and DIP narrowing was noted.  No intercarpal radiocarpal joint space narrowing was noted. Impression: These findings are suggestive of osteoarthritis of the hand.  XR Hand 2 View Right Result Date: 05/15/2023 CMC, first MCP, PIP and DIP narrowing was noted.  Cystic changes were noted in the PIP and DIP joints.  No intercarpal radiocarpal joint space narrowing was noted.  No erosive changes were noted. Impression: These  findings are suggestive of osteoarthritis of the hand.  XR KNEE 3 VIEW LEFT Result Date: 05/15/2023 No medial or lateral compartment narrowing was noted.  No patellofemoral narrowing was noted.  No chondrocalcinosis was noted. Impression: Unremarkable x-rays of the knee.  XR KNEE 3 VIEW RIGHT Result Date: 05/15/2023 No medial or lateral compartment narrowing was noted.  No patellofemoral narrowing was noted.  No chondrocalcinosis was noted. Impression: Unremarkable x-rays of the knee.  XR Cervical Spine 2 or 3 views Result Date: 05/15/2023 Loss of lordosis was noted.  Multilevel spondylosis was noted.  Most significant narrowing was noted between C3-C4, C4-C5, C5-C6 and C6-C7.  Facet joint arthropathy was noted. Impression: Multilevel spondylosis and facet joint arthropathy was noted.   Assessment/plan:   GERD/dyspepsia: -doing well -continue Dexilant  60mg  daily before breakfast -try stopping famotidine , if recurrent  symptoms he will call us  and resume famotidine  -limit sucralfate  to as needed, RX given by PCP  Constipation: -doing well -encouraged high fiber diet, adequate liquids  Normocytic anemia: -stable, likely anemia of chronic disease -stool hemoccult negative previously -EGD/colonoscopy 2022 -patient aware that no further work up needed unless he has overt GI bleeding or significant decline in Hgb  Return ov in one year.  Brandon Lawrence, MHS, PA-C [facility]

## 2023-06-17 ENCOUNTER — Ambulatory Visit: Payer: 59 | Attending: Rheumatology | Admitting: Rheumatology

## 2023-06-17 ENCOUNTER — Encounter: Payer: Self-pay | Admitting: Rheumatology

## 2023-06-17 VITALS — BP 118/62 | HR 57 | Resp 16 | Ht 66.0 in | Wt 159.0 lb

## 2023-06-17 DIAGNOSIS — E1122 Type 2 diabetes mellitus with diabetic chronic kidney disease: Secondary | ICD-10-CM

## 2023-06-17 DIAGNOSIS — R809 Proteinuria, unspecified: Secondary | ICD-10-CM

## 2023-06-17 DIAGNOSIS — M47817 Spondylosis without myelopathy or radiculopathy, lumbosacral region: Secondary | ICD-10-CM | POA: Diagnosis not present

## 2023-06-17 DIAGNOSIS — K449 Diaphragmatic hernia without obstruction or gangrene: Secondary | ICD-10-CM

## 2023-06-17 DIAGNOSIS — J301 Allergic rhinitis due to pollen: Secondary | ICD-10-CM

## 2023-06-17 DIAGNOSIS — N400 Enlarged prostate without lower urinary tract symptoms: Secondary | ICD-10-CM

## 2023-06-17 DIAGNOSIS — R768 Other specified abnormal immunological findings in serum: Secondary | ICD-10-CM

## 2023-06-17 DIAGNOSIS — M25552 Pain in left hip: Secondary | ICD-10-CM

## 2023-06-17 DIAGNOSIS — M79671 Pain in right foot: Secondary | ICD-10-CM

## 2023-06-17 DIAGNOSIS — B009 Herpesviral infection, unspecified: Secondary | ICD-10-CM

## 2023-06-17 DIAGNOSIS — F330 Major depressive disorder, recurrent, mild: Secondary | ICD-10-CM

## 2023-06-17 DIAGNOSIS — M5030 Other cervical disc degeneration, unspecified cervical region: Secondary | ICD-10-CM

## 2023-06-17 DIAGNOSIS — N1831 Chronic kidney disease, stage 3a: Secondary | ICD-10-CM

## 2023-06-17 DIAGNOSIS — Z85528 Personal history of other malignant neoplasm of kidney: Secondary | ICD-10-CM | POA: Diagnosis not present

## 2023-06-17 DIAGNOSIS — N2581 Secondary hyperparathyroidism of renal origin: Secondary | ICD-10-CM

## 2023-06-17 DIAGNOSIS — M25562 Pain in left knee: Secondary | ICD-10-CM

## 2023-06-17 DIAGNOSIS — M19041 Primary osteoarthritis, right hand: Secondary | ICD-10-CM | POA: Diagnosis not present

## 2023-06-17 DIAGNOSIS — I10 Essential (primary) hypertension: Secondary | ICD-10-CM | POA: Diagnosis not present

## 2023-06-17 DIAGNOSIS — K219 Gastro-esophageal reflux disease without esophagitis: Secondary | ICD-10-CM | POA: Diagnosis not present

## 2023-06-17 DIAGNOSIS — G43B0 Ophthalmoplegic migraine, not intractable: Secondary | ICD-10-CM

## 2023-06-17 DIAGNOSIS — E559 Vitamin D deficiency, unspecified: Secondary | ICD-10-CM

## 2023-06-17 DIAGNOSIS — Z860101 Personal history of adenomatous and serrated colon polyps: Secondary | ICD-10-CM

## 2023-06-17 DIAGNOSIS — M25561 Pain in right knee: Secondary | ICD-10-CM | POA: Diagnosis not present

## 2023-06-17 DIAGNOSIS — D508 Other iron deficiency anemias: Secondary | ICD-10-CM

## 2023-06-17 DIAGNOSIS — Z736 Limitation of activities due to disability: Secondary | ICD-10-CM

## 2023-06-17 DIAGNOSIS — G8929 Other chronic pain: Secondary | ICD-10-CM

## 2023-06-17 DIAGNOSIS — M79672 Pain in left foot: Secondary | ICD-10-CM

## 2023-06-17 DIAGNOSIS — G4733 Obstructive sleep apnea (adult) (pediatric): Secondary | ICD-10-CM

## 2023-06-17 DIAGNOSIS — M19042 Primary osteoarthritis, left hand: Secondary | ICD-10-CM

## 2023-06-17 NOTE — Patient Instructions (Addendum)
Hand Exercises Hand exercises can be helpful for almost anyone. They can strengthen your hands and improve flexibility and movement. The exercises can also increase blood flow to the hands. These results can make your work and daily tasks easier for you. Hand exercises can be especially helpful for people who have joint pain from arthritis or nerve damage from using their hands over and over. These exercises can also help people who injure a hand. Exercises Most of these hand exercises are gentle stretching and motion exercises. It is usually safe to do them often throughout the day. Warming up your hands before exercise may help reduce stiffness. You can do this with gentle massage or by placing your hands in warm water for 10-15 minutes. It is normal to feel some stretching, pulling, tightness, or mild discomfort when you begin new exercises. In time, this will improve. Remember to always be careful and stop right away if you feel sudden, very bad pain or your pain gets worse. You want to get better and be safe. Ask your health care provider which exercises are safe for you. Do exercises exactly as told by your provider and adjust them as told. Do not begin these exercises until told by your provider. Knuckle bend or "claw" fist  Stand or sit with your arm, hand, and all five fingers pointed straight up. Make sure to keep your wrist straight. Gently bend your fingers down toward your palm until the tips of your fingers are touching your palm. Keep your big knuckle straight and only bend the small knuckles in your fingers. Hold this position for 10 seconds. Straighten your fingers back to your starting position. Repeat this exercise 5-10 times with each hand. Full finger fist  Stand or sit with your arm, hand, and all five fingers pointed straight up. Make sure to keep your wrist straight. Gently bend your fingers into your palm until the tips of your fingers are touching the middle of your  palm. Hold this position for 10 seconds. Extend your fingers back to your starting position, stretching every joint fully. Repeat this exercise 5-10 times with each hand. Straight fist  Stand or sit with your arm, hand, and all five fingers pointed straight up. Make sure to keep your wrist straight. Gently bend your fingers at the big knuckle, where your fingers meet your hand, and at the middle knuckle. Keep the knuckle at the tips of your fingers straight and try to touch the bottom of your palm. Hold this position for 10 seconds. Extend your fingers back to your starting position, stretching every joint fully. Repeat this exercise 5-10 times with each hand. Tabletop  Stand or sit with your arm, hand, and all five fingers pointed straight up. Make sure to keep your wrist straight. Gently bend your fingers at the big knuckle, where your fingers meet your hand, as far down as you can. Keep the small knuckles in your fingers straight. Think of forming a tabletop with your fingers. Hold this position for 10 seconds. Extend your fingers back to your starting position, stretching every joint fully. Repeat this exercise 5-10 times with each hand. Finger spread  Place your hand flat on a table with your palm facing down. Make sure your wrist stays straight. Spread your fingers and thumb apart from each other as far as you can until you feel a gentle stretch. Hold this position for 10 seconds. Bring your fingers and thumb tight together again. Hold this position for 10 seconds. Repeat  this exercise 5-10 times with each hand. Making circles  Stand or sit with your arm, hand, and all five fingers pointed straight up. Make sure to keep your wrist straight. Make a circle by touching the tip of your thumb to the tip of your index finger. Hold for 10 seconds. Then open your hand wide. Repeat this motion with your thumb and each of your fingers. Repeat this exercise 5-10 times with each hand. Thumb  motion  Sit with your forearm resting on a table and your wrist straight. Your thumb should be facing up toward the ceiling. Keep your fingers relaxed as you move your thumb. Lift your thumb up as high as you can toward the ceiling. Hold for 10 seconds. Bend your thumb across your palm as far as you can, reaching the tip of your thumb for the small finger (pinkie) side of your palm. Hold for 10 seconds. Repeat this exercise 5-10 times with each hand. Grip strengthening  Hold a stress ball or other soft ball in the middle of your hand. Slowly increase the pressure, squeezing the ball as much as you can without causing pain. Think of bringing the tips of your fingers into the middle of your palm. All of your finger joints should bend when doing this exercise. Hold your squeeze for 10 seconds, then relax. Repeat this exercise 5-10 times with each hand. Contact a health care provider if: Your hand pain or discomfort gets much worse when you do an exercise. Your hand pain or discomfort does not improve within 2 hours after you exercise. If you have either of these problems, stop doing these exercises right away. Do not do them again unless your provider says that you can. Get help right away if: You develop sudden, severe hand pain or swelling. If this happens, stop doing these exercises right away. Do not do them again unless your provider says that you can. This information is not intended to replace advice given to you by your health care provider. Make sure you discuss any questions you have with your health care provider. Document Revised: 05/07/2022 Document Reviewed: 05/07/2022 [employer] Patient Education  2024 [employer]  Exercises for Chronic Knee Pain Chronic knee pain is pain that lasts longer than 3 months. For most people with chronic knee pain, exercise and weight loss is an important part of treatment. Your health care provider may want you to focus on: Making the muscles that  support your knee stronger. This can take pressure off your knee and reduce pain. Preventing knee stiffness. How far you can move your knee, keeping it there or making it farther. Losing weight (if this applies) to take pressure off your knee, lower your risk for injury, and make it easier for you to exercise. Your provider will help you make an exercise program that fits your needs and physical abilities. Below are simple, low-impact exercises you can do at home. Ask your provider or physical therapist how often you should do your exercise program and how many times to repeat each exercise. General safety tips  Get your provider's approval before doing any exercises. Start slowly and stop any time you feel pain. Do not exercise if your knee pain is flaring up. Warm up first. Stretching a cold muscle can cause an injury. Do 5-10 minutes of easy movement or light stretching before beginning your exercises. Do 5-10 minutes of low-impact activity (like walking or cycling) before starting strengthening exercises. Contact your provider any time you have pain  during or after exercising. Exercise can cause discomfort but should not be painful. It is normal to be a little stiff or sore after exercising. Stretching and range-of-motion exercises Front thigh stretch  Stand up straight and support your body by holding on to a chair or resting one hand on a wall. With your legs straight and close together, bend one knee to lift your heel up toward your butt. Using one hand for support, grab your ankle with your free hand. Pull your foot up closer toward your butt to feel the stretch in front of your thigh. Hold the stretch for 30 seconds. Repeat __________ times. Complete this exercise __________ times a day. Back thigh stretch  Sit on the floor with your back straight and your legs out straight in front of you. Place the palms of your hands on the floor and slide them toward your feet as you bend at the  hip. Try to touch your nose to your knees and feel the stretch in the back of your thighs. Hold for 30 seconds. Repeat __________ times. Complete this exercise __________ times a day. Calf stretch  Stand facing a wall. Place the palms of your hands flat against the wall, arms extended, and lean slightly against the wall. Get into a lunge position with one leg bent at the knee and the other leg stretched out straight behind you. Keep both feet facing the wall and increase the bend in your knee while keeping the heel of the other leg flat on the ground. You should feel the stretch in your calf. Hold for 30 seconds. Repeat __________ times. Complete this exercise __________ times a day. Strengthening exercises Straight leg lift  Lie on your back with one knee bent and the other leg out straight. Slowly lift the straight leg without bending the knee. Lift until your foot is about 12 inches (30 cm) off the floor. Hold for 3-5 seconds and slowly lower your leg. Repeat __________ times. Complete this exercise __________ times a day. Single leg dip  Stand between two chairs and put both hands on the backs of the chairs for support. Extend one leg out straight with your body weight resting on the heel of the standing leg. Slowly bend your standing knee to dip your body to the level that is comfortable for you. Hold for 3-5 seconds. Repeat __________ times. Complete this exercise __________ times a day. Hamstring curls  Stand straight, knees close together, facing the back of a chair. Hold on to the back of a chair with both hands. Keep one leg straight. Bend the other knee while bringing the heel up toward the butt until the knee is bent at a 90-degree angle (right angle). Hold for 3-5 seconds. Repeat __________ times. Complete this exercise __________ times a day. Wall squat  Stand straight with your back, hips, and head against a wall. Step forward one foot at a time with your back  still against the wall. Your feet should be 2 feet (61 cm) from the wall at shoulder width. Keeping your back, hips, and head against the wall, slide down the wall to as close to a sitting position as you can get. Hold for 5-10 seconds, then slowly slide back up. Repeat __________ times. Complete this exercise __________ times a day. Step-ups  Stand in front of a sturdy platform or stool that is about 6 inches (15 cm) high. Slowly step up with your left / right foot, keeping your knee in line with your  hip and foot. Do not let your knee bend so far that you cannot see your toes. Hold on to a chair for balance, but do not use it for support. Slowly unlock your knee and lower yourself to the starting position. Repeat __________ times. Complete this exercise __________ times a day. Contact a health care provider if: Your exercises cause pain. Your pain is worse after you exercise. Your pain prevents you from doing your exercises. This information is not intended to replace advice given to you by your health care provider. Make sure you discuss any questions you have with your health care provider. Document Revised: 05/07/2022 Document Reviewed: 05/07/2022 [employer] Patient Education  2024 [employer] Cervical Strain and Sprain Rehab Ask your health care provider which exercises are safe for you. Do exercises exactly as told by your health care provider and adjust them as directed. It is normal to feel mild stretching, pulling, tightness, or discomfort as you do these exercises. Stop right away if you feel sudden pain or your pain gets worse. Do not begin these exercises until told by your health care provider. Stretching and range-of-motion exercises Cervical side bending  Using good posture, sit on a stable chair or stand up. Without moving your shoulders, slowly tilt your left / right ear to your shoulder until you feel a stretch in the neck muscles on the opposite side. You should be looking  straight ahead. Hold for __________ seconds. Repeat with the other side of your neck. Repeat __________ times. Complete this exercise __________ times a day. Cervical rotation  Using good posture, sit on a stable chair or stand up. Slowly turn your head to the side as if you are looking over your left / right shoulder. Keep your eyes level with the ground. Stop when you feel a stretch along the side and the back of your neck. Hold for __________ seconds. Repeat this by turning to your other side. Repeat __________ times. Complete this exercise __________ times a day. Thoracic extension and pectoral stretch  Roll a towel or a small blanket so it is about 4 inches (10 cm) in diameter. Lie down on your back on a firm surface. Put the towel in the middle of your back across your spine. It should not be under your shoulder blades. Put your hands behind your head and let your elbows fall out to your sides. Hold for __________ seconds. Repeat __________ times. Complete this exercise __________ times a day. Strengthening exercises Upper cervical flexion  Lie on your back with a thin pillow behind your head or a small, rolled-up towel under your neck. Gently tuck your chin toward your chest and nod your head down to look toward your feet. Do not lift your head off the pillow. Hold for __________ seconds. Release the tension slowly. Relax your neck muscles completely before you repeat this exercise. Repeat __________ times. Complete this exercise __________ times a day. Cervical extension  Stand about 6 inches (15 cm) away from a wall, with your back facing the wall. Place a soft object, about 6-8 inches (15-20 cm) in diameter, between the back of your head and the wall. A soft object could be a small pillow, a ball, or a folded towel. Gently tilt your head back and press into the soft object. Keep your jaw and forehead relaxed. Hold for __________ seconds. Release the tension slowly. Relax  your neck muscles completely before you repeat this exercise. Repeat __________ times. Complete this exercise __________ times a day.  Posture and body mechanics Body mechanics refer to the movements and positions of your body while you do your daily activities. Posture is part of body mechanics. Good posture and healthy body mechanics can help to relieve stress in your body's tissues and joints. Good posture means that your spine is in its natural S-curve position (your spine is neutral), your shoulders are pulled back slightly, and your head is not tipped forward. The following are general guidelines for using improved posture and body mechanics in your everyday activities. Sitting  When sitting, keep your spine neutral and keep your feet flat on the floor. Use a footrest, if needed, and keep your thighs parallel to the floor. Avoid rounding your shoulders. Avoid tilting your head forward. When working at a desk or a computer, keep your desk at a height where your hands are slightly lower than your elbows. Slide your chair under your desk so you are close enough to maintain good posture. When working at a computer, place your monitor at a height where you are looking straight ahead and you do not have to tilt your head forward or downward to look at the screen. Standing  When standing, keep your spine neutral and keep your feet about hip-width apart. Keep a slight bend in your knees. Your ears, shoulders, and hips should line up. When you do a task in which you stand in one place for a long time, place one foot up on a stable object that is 2-4 inches (5-10 cm) high, such as a footstool. This helps keep your spine neutral. Resting When lying down and resting, avoid positions that are most painful for you. Try to support your neck in a neutral position. You can use a contour pillow or a small rolled-up towel. Your pillow should support your neck but not push on it. This information is not intended to  replace advice given to you by your health care provider. Make sure you discuss any questions you have with your health care provider. Document Revised: 08/26/2022 Document Reviewed: 11/12/2021 Else[employer] Patient Education  2024 [employer] Low Back Sprain or Strain Rehab Ask your health care provider which exercises are safe for you. Do exercises exactly as told by your health care provider and adjust them as directed. It is normal to feel mild stretching, pulling, tightness, or discomfort as you do these exercises. Stop right away if you feel sudden pain or your pain gets worse. Do not begin these exercises until told by your health care provider. Stretching and range-of-motion exercises These exercises warm up your muscles and joints and improve the movement and flexibility of your back. These exercises also help to relieve pain, numbness, and tingling. Lumbar rotation  Lie on your back on a firm bed or the floor with your knees bent. Straighten your arms out to your sides so each arm forms a 90-degree angle (right angle) with a side of your body. Slowly move (rotate) both of your knees to one side of your body until you feel a stretch in your lower back (lumbar). Try not to let your shoulders lift off the floor. Hold this position for __________ seconds. Tense your abdominal muscles and slowly move your knees back to the starting position. Repeat this exercise on the other side of your body. Repeat __________ times. Complete this exercise __________ times a day. Single knee to chest  Lie on your back on a firm bed or the floor with both legs straight. Bend one of your knees.  Use your hands to move your knee up toward your chest until you feel a gentle stretch in your lower back and buttock. Hold your leg in this position by holding on to the front of your knee. Keep your other leg as straight as possible. Hold this position for __________ seconds. Slowly return to the starting  position. Repeat with your other leg. Repeat __________ times. Complete this exercise __________ times a day. Prone extension on elbows  Lie on your abdomen on a firm bed or the floor (prone position). Prop yourself up on your elbows. Use your arms to help lift your chest up until you feel a gentle stretch in your abdomen and your lower back. This will place some of your body weight on your elbows. If this is uncomfortable, try stacking pillows under your chest. Your hips should stay down, against the surface that you are lying on. Keep your hip and back muscles relaxed. Hold this position for __________ seconds. Slowly relax your upper body and return to the starting position. Repeat __________ times. Complete this exercise __________ times a day. Strengthening exercises These exercises build strength and endurance in your back. Endurance is the ability to use your muscles for a long time, even after they get tired. Pelvic tilt This exercise strengthens the muscles that lie deep in the abdomen. Lie on your back on a firm bed or the floor with your legs extended. Bend your knees so they are pointing toward the ceiling and your feet are flat on the floor. Tighten your lower abdominal muscles to press your lower back against the floor. This motion will tilt your pelvis so your tailbone points up toward the ceiling instead of pointing to your feet or the floor. To help with this exercise, you may place a small towel under your lower back and try to push your back into the towel. Hold this position for __________ seconds. Let your muscles relax completely before you repeat this exercise. Repeat __________ times. Complete this exercise __________ times a day. Alternating arm and leg raises  Get on your hands and knees on a firm surface. If you are on a hard floor, you may want to use padding, such as an exercise mat, to cushion your knees. Line up your arms and legs. Your hands should be  directly below your shoulders, and your knees should be directly below your hips. Lift your left leg behind you. At the same time, raise your right arm and straighten it in front of you. Do not lift your leg higher than your hip. Do not lift your arm higher than your shoulder. Keep your abdominal and back muscles tight. Keep your hips facing the ground. Do not arch your back. Keep your balance carefully, and do not hold your breath. Hold this position for __________ seconds. Slowly return to the starting position. Repeat with your right leg and your left arm. Repeat __________ times. Complete this exercise __________ times a day. Abdominal set with straight leg raise  Lie on your back on a firm bed or the floor. Bend one of your knees and keep your other leg straight. Tense your abdominal muscles and lift your straight leg up, 4-6 inches (10-15 cm) off the ground. Keep your abdominal muscles tight and hold this position for __________ seconds. Do not hold your breath. Do not arch your back. Keep it flat against the ground. Keep your abdominal muscles tense as you slowly lower your leg back to the starting position. Repeat with  your other leg. Repeat __________ times. Complete this exercise __________ times a day. Single leg lower with bent knees Lie on your back on a firm bed or the floor. Tense your abdominal muscles and lift your feet off the floor, one foot at a time, so your knees and hips are bent in 90-degree angles (right angles). Your knees should be over your hips and your lower legs should be parallel to the floor. Keeping your abdominal muscles tense and your knee bent, slowly lower one of your legs so your toe touches the ground. Lift your leg back up to return to the starting position. Do not hold your breath. Do not let your back arch. Keep your back flat against the ground. Repeat with your other leg. Repeat __________ times. Complete this exercise __________ times a  day. Posture and body mechanics Good posture and healthy body mechanics can help to relieve stress in your body's tissues and joints. Body mechanics refers to the movements and positions of your body while you do your daily activities. Posture is part of body mechanics. Good posture means: Your spine is in its natural S-curve position (neutral). Your shoulders are pulled back slightly. Your head is not tipped forward (neutral). Follow these guidelines to improve your posture and body mechanics in your everyday activities. Standing  When standing, keep your spine neutral and your feet about hip-width apart. Keep a slight bend in your knees. Your ears, shoulders, and hips should line up. When you do a task in which you stand in one place for a long time, place one foot up on a stable object that is 2-4 inches (5-10 cm) high, such as a footstool. This helps keep your spine neutral. Sitting  When sitting, keep your spine neutral and keep your feet flat on the floor. Use a footrest, if necessary, and keep your thighs parallel to the floor. Avoid rounding your shoulders, and avoid tilting your head forward. When working at a desk or a computer, keep your desk at a height where your hands are slightly lower than your elbows. Slide your chair under your desk so you are close enough to maintain good posture. When working at a computer, place your monitor at a height where you are looking straight ahead and you do not have to tilt your head forward or downward to look at the screen. Resting When lying down and resting, avoid positions that are most painful for you. If you have pain with activities such as sitting, bending, stooping, or squatting, lie in a position in which your body does not bend very much. For example, avoid curling up on your side with your arms and knees near your chest (fetal position). If you have pain with activities such as standing for a long time or reaching with your arms, lie  with your spine in a neutral position and bend your knees slightly. Try the following positions: Lying on your side with a pillow between your knees. Lying on your back with a pillow under your knees. Lifting  When lifting objects, keep your feet at least shoulder-width apart and tighten your abdominal muscles. Bend your knees and hips and keep your spine neutral. It is important to lift using the strength of your legs, not your back. Do not lock your knees straight out. Always ask for help to lift heavy or awkward objects. This information is not intended to replace advice given to you by your health care provider. Make sure you discuss any questions you  have with your health care provider. Document Revised: 08/26/2022 Document Reviewed: 07/10/2020 [employer] Patient Education  2024 [employer].

## 2023-07-01 ENCOUNTER — Other Ambulatory Visit: Payer: Self-pay | Admitting: Family Medicine

## 2023-07-01 ENCOUNTER — Ambulatory Visit: Payer: Self-pay | Admitting: Family Medicine

## 2023-07-01 NOTE — Telephone Encounter (Signed)
 Copied from CRM [id number]. Topic: Clinical - Red Word Triage >> Jul 01, 2023 11:58 AM Shon Hale wrote: Red Word that prompted transfer to Nurse Triage: Dizziness and nausea since Saturday. Headache that comes and goes.   Chief Complaint: Dizziness Symptoms: Nausea, Abdominal Pain Frequency: One Week Pertinent Negatives: Patient denies neurological deficit,  Disposition: [] ED /[] Urgent Care (no appt availability in office) / [x] Appointment(In office/virtual)/ []  [facility]/ [] Home Care/ [] Refused Recommended Disposition /[] [facility]/ []  Follow-up with PCP Additional Notes: Patient contacted via phone regarding concerns of dizziness, nausea, and abdominal pain. Discussed symptoms, severity, and duration. Based on assessment, patient was advised to see PCP in 24 hours. The patient complains of a headache, numbness and tingling that is bilateral. Advised patient to be seen as soon as possible, but the patient opted to schedule an appointment for Thursday. Advised both the patient and his wife that if symptoms worsened or changed at all, to seek emergency or urgent care immediately. Patient verbalized understanding and agreement with plan. Documentation provided.     Reason for Disposition  [1] MODERATE dizziness (e.g., vertigo; feels very unsteady, interferes with normal activities) AND [2] has NOT been evaluated by doctor (or NP/PA) for this  Answer Assessment - Initial Assessment Questions 1. DESCRIPTION: "Describe your dizziness."     Room Spinning, Lightheadedness  2. VERTIGO: "Do you feel like either you or the room is spinning or tilting?"      Spinning  3. LIGHTHEADED: "Do you feel lightheaded?" (e.g., somewhat faint, woozy, weak upon standing)     Faint  4. SEVERITY: "How bad is it?"  "Can you walk?"   - MILD: Feels slightly dizzy and unsteady, but is walking normally.   - MODERATE: Feels unsteady when walking, but not falling; interferes with normal activities  (e.g., school, work).   - SEVERE: Unable to walk without falling, or requires assistance to walk without falling.     Mild to Moderate  5. ONSET:  "When did the dizziness begin?"     Since Last Week  6. AGGRAVATING FACTORS: "Does anything make it worse?" (e.g., standing, change in head position)      Head motion makes it worse  7. CAUSE: "What do you think is causing the dizziness?"     Unsure  8. RECURRENT SYMPTOM: "Have you had dizziness before?" If Yes, ask: "When was the last time?" "What happened that time?"     Yes, Blood pressure issues and   9. OTHER SYMPTOMS: "Do you have any other symptoms?" (e.g., headache, weakness, numbness, vomiting, earache)     Nausea, Abdominal Pain, Headache  Protocols used: Dizziness - Vertigo-A-AH

## 2023-07-03 ENCOUNTER — Ambulatory Visit: Payer: 59 | Attending: Family Medicine

## 2023-07-03 ENCOUNTER — Encounter: Payer: Self-pay | Admitting: Family Medicine

## 2023-07-03 ENCOUNTER — Ambulatory Visit (INDEPENDENT_AMBULATORY_CARE_PROVIDER_SITE_OTHER): Payer: 59 | Admitting: Family Medicine

## 2023-07-03 VITALS — BP 120/60 | HR 67 | Temp 98.1°F | Ht 66.0 in | Wt 157.8 lb

## 2023-07-03 DIAGNOSIS — R809 Proteinuria, unspecified: Secondary | ICD-10-CM | POA: Diagnosis not present

## 2023-07-03 DIAGNOSIS — R42 Dizziness and giddiness: Secondary | ICD-10-CM

## 2023-07-03 DIAGNOSIS — R55 Syncope and collapse: Secondary | ICD-10-CM

## 2023-07-03 DIAGNOSIS — E211 Secondary hyperparathyroidism, not elsewhere classified: Secondary | ICD-10-CM | POA: Diagnosis not present

## 2023-07-03 DIAGNOSIS — D631 Anemia in chronic kidney disease: Secondary | ICD-10-CM | POA: Diagnosis not present

## 2023-07-03 DIAGNOSIS — N189 Chronic kidney disease, unspecified: Secondary | ICD-10-CM | POA: Diagnosis not present

## 2023-07-03 NOTE — Progress Notes (Unsigned)
 EP to read.
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
## 2023-07-04 LAB — CBC WITH DIFFERENTIAL/PLATELET
Absolute Lymphocytes: 2184 {cells}/uL (ref 850–3900)
Absolute Monocytes: 434 {cells}/uL (ref 200–950)
Basophils Absolute: 30 {cells}/uL (ref 0–200)
Basophils Relative: 0.7 %
Eosinophils Absolute: 314 {cells}/uL (ref 15–500)
Eosinophils Relative: 7.3 %
HCT: 37.5 % — ABNORMAL LOW (ref 38.5–50.0)
Hemoglobin: 12.5 g/dL — ABNORMAL LOW (ref 13.2–17.1)
MCH: 30.8 pg (ref 27.0–33.0)
MCHC: 33.3 g/dL (ref 32.0–36.0)
MCV: 92.4 fL (ref 80.0–100.0)
MPV: 9.4 fL (ref 7.5–12.5)
Monocytes Relative: 10.1 %
Neutro Abs: 1337 {cells}/uL — ABNORMAL LOW (ref 1500–7800)
Neutrophils Relative %: 31.1 %
Platelets: 215 10*3/uL (ref 140–400)
RBC: 4.06 10*6/uL — ABNORMAL LOW (ref 4.20–5.80)
RDW: 13 % (ref 11.0–15.0)
Total Lymphocyte: 50.8 %
WBC: 4.3 10*3/uL (ref 3.8–10.8)

## 2023-07-04 LAB — COMPLETE METABOLIC PANEL WITH GFR
AG Ratio: 1.4 (calc) (ref 1.0–2.5)
ALT: 31 U/L (ref 9–46)
AST: 29 U/L (ref 10–35)
Albumin: 4.3 g/dL (ref 3.6–5.1)
Alkaline phosphatase (APISO): 33 U/L — ABNORMAL LOW (ref 35–144)
BUN/Creatinine Ratio: 14 (calc) (ref 6–22)
BUN: 23 mg/dL (ref 7–25)
CO2: 30 mmol/L (ref 20–32)
Calcium: 9.4 mg/dL (ref 8.6–10.3)
Chloride: 101 mmol/L (ref 98–110)
Creat: 1.65 mg/dL — ABNORMAL HIGH (ref 0.70–1.35)
Globulin: 3 g/dL (ref 1.9–3.7)
Glucose, Bld: 106 mg/dL — ABNORMAL HIGH (ref 65–99)
Potassium: 3.9 mmol/L (ref 3.5–5.3)
Sodium: 137 mmol/L (ref 135–146)
Total Bilirubin: 0.5 mg/dL (ref 0.2–1.2)
Total Protein: 7.3 g/dL (ref 6.1–8.1)
eGFR: 47 mL/min/{1.73_m2} — ABNORMAL LOW (ref >=60)

## 2023-07-07 DIAGNOSIS — R55 Syncope and collapse: Secondary | ICD-10-CM

## 2023-07-09 ENCOUNTER — Other Ambulatory Visit (INDEPENDENT_AMBULATORY_CARE_PROVIDER_SITE_OTHER)

## 2023-07-09 ENCOUNTER — Other Ambulatory Visit: Payer: Self-pay | Admitting: Orthopedic Surgery

## 2023-07-09 ENCOUNTER — Ambulatory Visit (INDEPENDENT_AMBULATORY_CARE_PROVIDER_SITE_OTHER): Payer: 59 | Admitting: Orthopedic Surgery

## 2023-07-09 VITALS — BP 125/56 | HR 71 | Ht 66.0 in | Wt 159.0 lb

## 2023-07-09 DIAGNOSIS — M47817 Spondylosis without myelopathy or radiculopathy, lumbosacral region: Secondary | ICD-10-CM | POA: Diagnosis not present

## 2023-07-09 DIAGNOSIS — M542 Cervicalgia: Secondary | ICD-10-CM

## 2023-07-09 DIAGNOSIS — M5412 Radiculopathy, cervical region: Secondary | ICD-10-CM | POA: Diagnosis not present
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
## 2023-07-21 ENCOUNTER — Ambulatory Visit (HOSPITAL_COMMUNITY): Attending: Orthopedic Surgery

## 2023-07-21 ENCOUNTER — Encounter (HOSPITAL_COMMUNITY): Payer: Self-pay

## 2023-07-21 ENCOUNTER — Other Ambulatory Visit: Payer: Self-pay

## 2023-07-21 DIAGNOSIS — M5412 Radiculopathy, cervical region: Secondary | ICD-10-CM | POA: Diagnosis not present

## 2023-07-21 DIAGNOSIS — M6281 Muscle weakness (generalized): Secondary | ICD-10-CM | POA: Insufficient documentation

## 2023-07-21 DIAGNOSIS — M5382 Other specified dorsopathies, cervical region: Secondary | ICD-10-CM | POA: Insufficient documentation
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
## 2023-07-23 DIAGNOSIS — E1122 Type 2 diabetes mellitus with diabetic chronic kidney disease: Secondary | ICD-10-CM | POA: Diagnosis not present

## 2023-07-23 DIAGNOSIS — R809 Proteinuria, unspecified: Secondary | ICD-10-CM | POA: Diagnosis not present

## 2023-07-23 DIAGNOSIS — N1831 Chronic kidney disease, stage 3a: Secondary | ICD-10-CM | POA: Diagnosis not present

## 2023-07-23 DIAGNOSIS — E1129 Type 2 diabetes mellitus with other diabetic kidney complication: Secondary | ICD-10-CM | POA: Diagnosis not present

## 2023-07-29 ENCOUNTER — Telehealth (HOSPITAL_COMMUNITY): Payer: Self-pay | Admitting: Physical Therapy

## 2023-07-29 ENCOUNTER — Encounter (HOSPITAL_COMMUNITY): Admitting: Physical Therapy

## 2023-07-29 ENCOUNTER — Other Ambulatory Visit: Payer: Self-pay | Admitting: Gastroenterology

## 2023-07-29 DIAGNOSIS — R55 Syncope and collapse: Secondary | ICD-10-CM | POA: Diagnosis not present

## 2023-07-29 NOTE — Telephone Encounter (Signed)
Pt did not show.  Unable to reach by phone.  Lurena Nida, PTA/CLT [facility] Ph: [phone]

## 2023-07-30 ENCOUNTER — Other Ambulatory Visit: Payer: Self-pay | Admitting: Cardiology

## 2023-07-30 DIAGNOSIS — R002 Palpitations: Secondary | ICD-10-CM

## 2023-08-04 ENCOUNTER — Encounter (HOSPITAL_COMMUNITY): Admitting: Physical Therapy

## 2023-08-04 ENCOUNTER — Telehealth (HOSPITAL_COMMUNITY): Payer: Self-pay | Admitting: Physical Therapy

## 2023-08-04 NOTE — Telephone Encounter (Signed)
 Pt did not show for appt.  Called and spoke to pt who states he forgot.  Pt reports he cancelled his last appt via phone tree, however therapist did not get this message and reportedly today is his second NS.  Explained our NS policy and reminded of next appt.    Lurena Nida, PTA/CLT [facility] Ph: [phone]

## 2023-08-06 ENCOUNTER — Encounter (HOSPITAL_COMMUNITY)

## 2023-08-12 ENCOUNTER — Encounter (HOSPITAL_COMMUNITY)

## 2023-08-12 ENCOUNTER — Encounter (HOSPITAL_COMMUNITY): Payer: Self-pay

## 2023-08-12 NOTE — Therapy (Signed)
 [facility] at [facility] [address] [place], [place], [address] Phone: [phone]   Fax:  [phone]  Patient Details  Name: Brandon Lawrence MRN: [id number] Date of Birth: 12/13/1960 Referring Provider:  No ref. provider found  Encounter Date: 08/12/2023  Patient was called concerning his appointment today at 1:00PM. Pt states he thought the appointment was at 1:45PM today. Pt was instructed and given the front desk number to call and reschedule.   Luz Lex, PT, DPT [facility] Office: [phone] 1:23 PM, 08/12/23   [facility]patient Rehabilitation at [facility] [address] [place], [place], [address] Phone: [phone]   Fax:  [phone]

## 2023-08-14 ENCOUNTER — Ambulatory Visit: Attending: Internal Medicine | Admitting: Internal Medicine

## 2023-08-14 ENCOUNTER — Encounter: Payer: Self-pay | Admitting: Internal Medicine

## 2023-08-14 ENCOUNTER — Encounter (HOSPITAL_COMMUNITY): Payer: Self-pay

## 2023-08-14 ENCOUNTER — Encounter (HOSPITAL_COMMUNITY)

## 2023-08-14 VITALS — BP 112/58 | HR 57 | Ht 65.0 in | Wt 158.0 lb

## 2023-08-14 DIAGNOSIS — I10 Essential (primary) hypertension: Secondary | ICD-10-CM

## 2023-08-14 DIAGNOSIS — R002 Palpitations: Secondary | ICD-10-CM

## 2023-08-14 DIAGNOSIS — E782 Mixed hyperlipidemia: Secondary | ICD-10-CM

## 2023-08-14 DIAGNOSIS — I2510 Atherosclerotic heart disease of native coronary artery without angina pectoris: Secondary | ICD-10-CM | POA: Diagnosis not present

## 2023-08-14 LAB — LIPID PANEL
Chol/HDL Ratio: 3.1 ratio (ref 0.0–5.0)
Cholesterol, Total: 113 mg/dL (ref 100–199)
HDL: 37 mg/dL — ABNORMAL LOW (ref >39)
LDL Chol Calc (NIH): 60 mg/dL (ref 0–99)
Triglycerides: 76 mg/dL (ref 0–149)
VLDL Cholesterol Cal: 16 mg/dL (ref 5–40)

## 2023-08-14 LAB — ALT: ALT: 29 IU/L (ref 0–44)

## 2023-08-14 NOTE — Patient Instructions (Signed)
 Medication Instructions:  Your physician has recommended you make the following change in your medication:   1) STOP amlodipine  *If you need a refill on your cardiac medications before your next appointment, please call your pharmacy*  Lab Work: TODAY: Lipid panel, ALT If you have labs (blood work) drawn today and your tests are completely normal, you will receive your results only by: MyChart Message (if you have MyChart) OR A paper copy in the mail If you have any lab test that is abnormal or we need to change your treatment, we will call you to review the results.  Follow-Up: At [facility], you and your health needs are our priority.  As part of our continuing mission to provide you with exceptional heart care, we have created designated Provider Care Teams.  These Care Teams include your primary Cardiologist (physician) and Advanced Practice Providers (APPs -  Physician Assistants and Nurse Practitioners) who all work together to provide you with the care you need, when you need it.  Your next appointment:   1 year(s)  The format for your next appointment:   In Person  Provider:   Christell Constant, MD  or Jari Favre, PA-C, Ronie Spies, PA-C, or Jacolyn Reedy, PA-C   Other Instructions   1st Floor: - Lobby - Registration  - Pharmacy  - Lab - Cafe  2nd Floor: - PV Lab - Diagnostic Testing (echo, CT, nuclear med)  3rd Floor: - Vacant  4th Floor: - TCTS (cardiothoracic surgery) - AFib Clinic - Structural Heart Clinic - Vascular Surgery  - Vascular Ultrasound  5th Floor: - HeartCare Cardiology (general and EP) - Clinical Pharmacy for coumadin, hypertension, lipid, weight-loss medications, and med management appointments    Valet parking services will be available as well.

## 2023-08-14 NOTE — Progress Notes (Signed)
 Cardiology Office Note:  .    Date:  08/14/2023  ID:  Brandon Lawrence, DOB 1960/05/20, MRN [id number] PCP: Brandon Brooks, MD  [facility] Providers Cardiologist:  Brandon Constant, MD     CC: CAD f/u  History of Present Illness: .    Brandon Lawrence is a 63 y.o. male with coronary artery disease and hyperlipidemia who presents for a follow-up visit.  He has a history of coronary artery disease and previously experienced chest pain. He follows the Mediterranean diet and has been treated for moderate nonobstructive coronary disease. No symptoms of acute coronary syndrome are present.  He has hyperlipidemia and has experienced upper gastrointestinal discomfort with statin therapy, indicating an intolerance. This intolerance has influenced the management of his hyperlipidemia.  In 2024, he was exercising regularly and reported doing well. He continues to adhere to lifestyle modifications, including the Mediterranean diet and regular exercise.   He notes rare near syncope in the morning.  Wife notes his heart races when this occurs.  His PCP cut his norvasc to 5 and chlorthalidone to 12.5 and this has helped but not resolved symptoms.   Relevant histories: .  Social  2023: Had COVID- 19 and stared therapy.  2024: ASA stopped due to GI issues   Discussed the use of AI scribe software for clinical note transcription with the patient, who gave verbal consent to proceed.  ROS: As per HPI.   Studies Reviewed: .   Cardiac Studies & Procedures   ______________________________________________________________________________________________   STRESS TESTS  MYOCARDIAL PERFUSION IMAGING 08/06/2021  Narrative   The study is normal. The study is low risk.   No ST deviation was noted.   LV perfusion is normal. There is no evidence of ischemia. There is no evidence of infarction.   Left ventricular function is normal. Nuclear stress EF: 59 %. The left ventricular  ejection fraction is normal (55-65%). End diastolic cavity size is normal. End systolic cavity size is normal.   ECHOCARDIOGRAM  ECHOCARDIOGRAM COMPLETE 11/29/2021  Narrative ECHOCARDIOGRAM REPORT    Patient Name:   Brandon Lawrence Date of Exam: 11/29/2021 Medical Rec #:  [id number]        Height:       65.0 in Accession #:    [id number]       Weight:       149.2 lb Date of Birth:  07/24/1960        BSA:          1.747 m Patient Age:    61 years         BP:           134/63 mmHg Patient Gender: M                HR:           61 bpm. Exam Location:  Brandon Lawrence  Procedure: 2D Echo, Cardiac Doppler and Color Doppler  Indications:    R55 (ICD-10-CM) - Pre-syncope  History:        Patient has prior history of Echocardiogram examinations, most recent 08/07/2016. Risk Factors:Hypertension. GERD. h/o Renal cell carcinoma. OSA (obstructive sleep apnea) (From Hx).  Sonographer:    Brandon Lawrence RCS Referring Phys: [id number] Brandon Lawrence  IMPRESSIONS   1. Left ventricular ejection fraction, by estimation, is 55 to 60%. The left ventricle has normal function. The left ventricle demonstrates regional wall motion abnormalities. Apical hypokinesis. Left ventricular diastolic parameters were normal. 2. Right ventricular  systolic function is normal. The right ventricular size is normal. There is normal pulmonary artery systolic pressure. The estimated right ventricular systolic pressure is 26.2 mmHg. 3. Right atrial size was mildly dilated. 4. The mitral valve is normal in structure. No evidence of mitral valve regurgitation. No evidence of mitral stenosis. 5. The aortic valve is tricuspid. Aortic valve regurgitation is not visualized. Aortic valve sclerosis is present, with no evidence of aortic valve stenosis. 6. The inferior vena cava is normal in size with greater than 50% respiratory variability, suggesting right atrial pressure of 3 mmHg.  FINDINGS Left Ventricle: Left ventricular ejection  fraction, by estimation, is 55 to 60%. The left ventricle has normal function. The left ventricle demonstrates regional wall motion abnormalities. The left ventricular internal cavity size was normal in size. There is no left ventricular hypertrophy. Left ventricular diastolic parameters were normal.  Right Ventricle: The right ventricular size is normal. No increase in right ventricular wall thickness. Right ventricular systolic function is normal. There is normal pulmonary artery systolic pressure. The tricuspid regurgitant velocity is 2.41 m/s, and with an assumed right atrial pressure of 3 mmHg, the estimated right ventricular systolic pressure is 26.2 mmHg.  Left Atrium: Left atrial size was normal in size.  Right Atrium: Right atrial size was mildly dilated.  Pericardium: There is no evidence of pericardial effusion.  Mitral Valve: The mitral valve is normal in structure. No evidence of mitral valve regurgitation. No evidence of mitral valve stenosis.  Tricuspid Valve: The tricuspid valve is normal in structure. Tricuspid valve regurgitation is trivial.  Aortic Valve: The aortic valve is tricuspid. Aortic valve regurgitation is not visualized. Aortic valve sclerosis is present, with no evidence of aortic valve stenosis.  Pulmonic Valve: The pulmonic valve was not well visualized. Pulmonic valve regurgitation is not visualized.  Aorta: The aortic root is normal in size and structure.  Venous: The inferior vena cava is normal in size with greater than 50% respiratory variability, suggesting right atrial pressure of 3 mmHg.  IAS/Shunts: The interatrial septum was not well visualized.   LEFT VENTRICLE PLAX 2D LVIDd:         5.40 cm   Diastology LVIDs:         3.40 cm   LV e' medial:    10.20 cm/s LV PW:         0.90 cm   LV E/e' medial:  8.3 LV IVS:        0.90 cm   LV e' lateral:   12.60 cm/s LVOT diam:     2.00 cm   LV E/e' lateral: 6.7 LV SV:         70 LV SV Index:   40 LVOT  Area:     3.14 cm   RIGHT VENTRICLE RV S prime:     15.20 cm/s TAPSE (M-mode): 2.8 cm  LEFT ATRIUM             Index        RIGHT ATRIUM           Index LA diam:        3.60 cm 2.06 cm/m   RA Area:     18.50 cm LA Vol (A2C):   46.2 ml 26.45 ml/m  RA Volume:   57.40 ml  32.87 ml/m LA Vol (A4C):   55.5 ml 31.78 ml/m LA Biplane Vol: 54.9 ml 31.43 ml/m AORTIC VALVE LVOT Vmax:   109.00 cm/s LVOT Vmean:  71.000 cm/s LVOT VTI:  0.224 m  AORTA Ao Root diam: 3.20 cm  MITRAL VALVE               TRICUSPID VALVE MV Area (PHT): 3.65 cm    TR Peak grad:   23.2 mmHg MV Decel Time: 208 msec    TR Vmax:        241.00 cm/s MV E velocity: 85.00 cm/s MV A velocity: 74.00 cm/s  SHUNTS MV E/A ratio:  1.15        Systemic VTI:  0.22 m Systemic Diam: 2.00 cm  Epifanio Lesches MD Electronically signed by Epifanio Lesches MD Signature Date/Time: 11/29/2021/5:11:54 PM    Final    MONITORS  LONG TERM MONITOR (3-14 DAYS) 07/29/2023  Narrative Patch Wear Time:  13 days and 22 hours (2025-03-03T09:53:43-0500 to 2025-03-17T09:17:58-0400)  HR 44 - 255, average 72 bpm. 2 nonsustained SVT (longest 12 beats) and 1 nonsustained VT (longest 4 beats) No atrial fibrillation detected. Occasional supraventricular ectopy, 1.5%. Rare ventricular ectopy. No sustained arrhythmias. Symptom trigger episodes correspond to primarily sinus rhythm, on one occasion sinus with PAC.  Nobie Putnam Cardiac Electrophysiology   CT SCANS  CT CORONARY MORPH W/CTA COR W/SCORE 03/14/2017  Addendum 03/17/2017  6:52 PM ADDENDUM REPORT: 03/17/2017 18:50  CLINICAL DATA:  63 year old male with atypical chest pain.  EXAM: Cardiac/Coronary  CT  TECHNIQUE: The patient was scanned on a [employer].  FINDINGS: A 120 kV prospective scan was triggered in the descending thoracic aorta at 111 HU's. Axial non-contrast 3 mm slices were carried out through the heart. The data set was analyzed on  a dedicated work station and scored using the Agatson method. Gantry rotation speed was 250 msecs and collimation was .6 mm. 5 mg iv metoprolol and 0.8 mg of sl NTG was given. The 3D data set was reconstructed in 5% intervals of the 67-82 % of the R-R cycle. Diastolic phases were analyzed on a dedicated work station using MPR, MIP and VRT modes. The patient received 80 cc of contrast.  Aorta:  Normal size.  No calcifications.  No dissection.  Aortic Valve:  Trileaflet.  No calcifications.  Coronary Arteries:  Normal coronary origin.  Right dominance.  RCA is a large dominant artery that gives rise to PDA and PLVB. There is no plaque.  Left main is a large artery that gives rise to LAD and LCX arteries. LM has no plaque.  LAD is a large vessel that gives rise to two diagonal arteries. Proximal LAD has a minimal calcified plaque with associated stenosis 0-25%. Mid LAD prior to takeoff of 2nd diagonal artery has moderate non-calcified plaque with associated stenosis 50-69%. This is followed by mild calcified plaque with associated stenosis 50%. Distal LAD has no significant plaque.  D1 has no plaque.  D2 is small artery that has moderate ostial plaque with associated stenosis at least 50-69%.  LCX is a non-dominant artery that gives rise to one OM1 branch. There is minimal calcified plaque in the mid LCX artery with associated stenosis 0-25%.  Other findings:  Normal pulmonary vein drainage into the left atrium.  Normal let atrial appendage without a thrombus.  Mildly dilated pulmonary artery measuring 31 mm.  IMPRESSION: 1. Coronary calcium score of 32. This was 30 percentile for age and sex matched control.  2. Normal coronary origin with right dominance.  3. Moderate stenosis in the mid LAD and at least moderate stenosis in a small second diagonal artery. Additional analysis with CT FFR will be performed.  4. Mildly dilated pulmonary artery measuring 31  mm.   Electronically Signed By: Tobias Alexander On: 03/17/2017 18:50  Narrative EXAM: OVER-READ INTERPRETATION  CT CHEST  The following report is an over-read performed by radiologist Dr. Maryelizabeth Rowan [facility] on 03/14/2017. This over-read does not include interpretation of cardiac or coronary anatomy or pathology. The coronary CTA interpretation by the cardiologist is attached.  COMPARISON:  chest radiograph 02/2023  FINDINGS: Limited view of the lung parenchyma demonstrates no suspicious nodularity. Airways are normal.  Limited view of the mediastinum demonstrates no adenopathy. Esophagus normal.  Limited view of the upper abdomen unremarkable.  Limited view of the skeleton and chest wall is unremarkable.  IMPRESSION: No significant extracardiac findings.  Electronically Signed: By: Genevive Bi M.D. On: 03/14/2017 15:59     ______________________________________________________________________________________________       Physical Exam:    VS:  BP (!) 112/58 (BP Location: Right Arm)   Pulse (!) 57   Ht 5\' 5"  (1.651 m)   Wt 71.7 kg   SpO2 95%   BMI 26.29 kg/m    Wt Readings from Last 3 Encounters:  08/14/23 71.7 kg  07/09/23 72.1 kg  07/03/23 71.6 kg    Gen: no distress,  Neck: No JVD Cardiac: No Rubs or Gallops, no Murmur, RRR +2radial pulses Respiratory: Clear to auscultation bilaterally, normal effort, normal  respiratory rate GI: Soft, nontender, non-distended  MS: No  edema;  moves all extremities Integument: Skin feels warm Neuro:  At time of evaluation, alert and oriented to person/place/time/situation  Psych: Normal affect, patient feels ok   ASSESSMENT AND PLAN: .    An EKG was ordered for CAD and shows stable sinus bradycardia  Coronary Artery Disease (CAD) He has moderate nonobstructive CAD with nonspecific T waves and sinus bradycardia on EKG, unchanged from 2024. He manages his condition with regular  exercise. No acute coronary syndrome is present. - Continue Mediterranean diet and regular exercise - if CAD worsened, add plavix given UGI issues on ASA  Hyperlipidemia He has hyperlipidemia with previous statin intolerance due to upper GI discomfort. Plavix monotherapy is considered if coronary issues develop. - Monitor lipid levels - LDL goal < 55, will likely increase statin dose   HTN OSA on CPAP - hx of RCC of the left kidney - symptomatic low BP - clarified med records - stop norvasc; ambulatory BP monitoring  One year f/u with my team    Riley Lam, MD FASE [facility] Cardiologist [facility]  [address] [place], [place] [address] [phone]  8:28 AM

## 2023-08-14 NOTE — Therapy (Signed)
 [facility] Outpatient Rehabilitation at [facility] [address] [place], [place], [address] Phone: [phone]   Fax:  [phone]  Patient Details  Name: Brandon Lawrence MRN: [id number] Date of Birth: August 10, 1960 Referring Provider:  No ref. provider found  Encounter Date: 08/14/2023  Pt was called concerning his third no show occurrence and voicemail was left informing him that he would be discharged due to the no show policy and that if he would like to come to therapy again he would need a new referral.   PHYSICAL THERAPY DISCHARGE SUMMARY  Visits from Start of Care: 1  Current functional level related to goals / functional outcomes: Pt has not returned since initial evaluation.   Remaining deficits: Same as evaluation   Education / Equipment: Pt has not returned since initial evaluation.   Patient agrees to discharge. Patient goals were  progressing . Patient is being discharged due to not returning since the last visit.   Luz Lex, PT, DPT [facility] Office: [phone] 1:34 PM, 08/14/23  [facility] Out[facility] at [facility] [address] [place], [place], [address] Phone: [phone]   Fax:  [phone]

## 2023-08-15 ENCOUNTER — Encounter: Payer: Self-pay | Admitting: Internal Medicine

## 2023-08-19 ENCOUNTER — Encounter (HOSPITAL_COMMUNITY)

## 2023-08-20 ENCOUNTER — Ambulatory Visit: Admitting: Orthopedic Surgery

## 2023-08-21 ENCOUNTER — Encounter (HOSPITAL_COMMUNITY)

## 2023-08-25 ENCOUNTER — Encounter (HOSPITAL_COMMUNITY)

## 2023-08-27 ENCOUNTER — Encounter (HOSPITAL_COMMUNITY)

## 2023-10-16 ENCOUNTER — Other Ambulatory Visit: Payer: Self-pay | Admitting: Family Medicine

## 2023-10-16 DIAGNOSIS — I10 Essential (primary) hypertension: Secondary | ICD-10-CM

## 2023-10-20 NOTE — Telephone Encounter (Signed)
 Requested Prescriptions  Pending Prescriptions Disp Refills   doxazosin  (CARDURA ) 4 MG tablet [Pharmacy Med Name: Doxazosin  Mesylate 4 MG Oral Tablet] 100 tablet 0    Sig: TAKE 1 TABLET BY MOUTH DAILY     Cardiovascular:  Alpha Blockers Passed - 10/20/2023 10:28 AM      Passed - Last BP in normal range    BP Readings from Last 1 Encounters:  08/14/23 (!) 112/58         Passed - Valid encounter within last 6 months    Recent Outpatient Visits           3 months ago Near syncope   [place] [facility] Cheril Cork, Cisco Crest, MD   4 months ago Abscess of skin of abdomen   [place] [facility] Austine Lefort, MD   5 months ago Stage 3b chronic kidney disease [facility])   [place] [facility] Austine Lefort, MD   8 months ago Benign essential HTN   [place] [facility] Pickard, Cisco Crest, MD   10 months ago Gastroenteritis   [place] [facility] Jenelle Mis, FNP       Future Appointments             In 8 months Nicholas Bari, MD [facility] Rheumatology - A Dept Of [place]. [facility]

## 2023-12-25 ENCOUNTER — Other Ambulatory Visit: Payer: Self-pay | Admitting: Family Medicine

## 2023-12-25 DIAGNOSIS — I10 Essential (primary) hypertension: Secondary | ICD-10-CM

## 2023-12-26 ENCOUNTER — Other Ambulatory Visit: Payer: Self-pay

## 2023-12-26 DIAGNOSIS — T485X5A Adverse effect of other anti-common-cold drugs, initial encounter: Secondary | ICD-10-CM

## 2023-12-26 DIAGNOSIS — J069 Acute upper respiratory infection, unspecified: Secondary | ICD-10-CM

## 2023-12-26 MED ORDER — FLUTICASONE PROPIONATE 50 MCG/ACT NA SUSP: 2.0000 | Freq: Every day | NASAL | 2 refills | Status: DC

## 2023-12-26 NOTE — Telephone Encounter (Signed)
 Requested Prescriptions  Pending Prescriptions Disp Refills   doxazosin  (CARDURA ) 4 MG tablet [Pharmacy Med Name: Doxazosin  Mesylate 4 MG Oral Tablet] 100 tablet 0    Sig: TAKE 1 TABLET BY MOUTH DAILY     Cardiovascular:  Alpha Blockers Failed - 12/26/2023  3:32 PM      Failed - Valid encounter within last 6 months    Recent Outpatient Visits           5 months ago Near syncope   [place] [facility] Duanne Butler DASEN, MD   7 months ago Abscess of skin of abdomen   [place] [facility] Duanne Butler DASEN, MD   7 months ago Stage 3b chronic kidney disease [facility])   [place] [facility] Duanne Butler DASEN, MD   11 months ago Benign essential HTN   [place] [facility] Pickard, Butler DASEN, MD   1 year ago Gastroenteritis    [facility] Kayla Jeoffrey RAMAN, FNP       Future Appointments             In 5 months Dolphus Reiter, MD [facility] Rheumatology - A Dept Of [place]. [facility]            Passed - Last BP in normal range    BP Readings from Last 1 Encounters:  08/14/23 (!) 112/58

## 2023-12-29 ENCOUNTER — Other Ambulatory Visit: Payer: Self-pay | Admitting: Family Medicine

## 2023-12-29 DIAGNOSIS — J069 Acute upper respiratory infection, unspecified: Secondary | ICD-10-CM

## 2023-12-29 DIAGNOSIS — J310 Chronic rhinitis: Secondary | ICD-10-CM

## 2023-12-30 NOTE — Telephone Encounter (Signed)
 Requested Prescriptions  Refused Prescriptions Disp Refills   fluticasone  (FLONASE ) 50 MCG/ACT nasal spray [Pharmacy Med Name: Fluticasone  Propionate 50 MCG/ACT Nasal Suspension] 48 g     Sig: USE 2 SPRAYS IN BOTH NOSTRILS  DAILY     Ear, Nose, and Throat: Nasal Preparations - Corticosteroids Passed - 12/30/2023  3:36 PM      Passed - Valid encounter within last 12 months    Recent Outpatient Visits           6 months ago Near syncope   [place] [facility] Duanne Butler DASEN, MD   7 months ago Abscess of skin of abdomen   [place] [facility] Duanne Butler DASEN, MD   7 months ago Stage 3b chronic kidney disease [facility])   [place] [facility] Duanne Butler DASEN, MD   11 months ago Benign essential HTN   [place] [facility] Pickard, Butler DASEN, MD   1 year ago Gastroenteritis   [place] [facility] Kayla Jeoffrey RAMAN, FNP       Future Appointments             In 5 months Dolphus Reiter, MD [facility] Rheumatology - A Dept Of [place]. [facility]

## 2024-01-02 ENCOUNTER — Ambulatory Visit (INDEPENDENT_AMBULATORY_CARE_PROVIDER_SITE_OTHER): Admitting: Family Medicine

## 2024-01-02 ENCOUNTER — Encounter: Payer: Self-pay | Admitting: Family Medicine

## 2024-01-02 VITALS — BP 124/68 | HR 66 | Temp 99.2°F | Ht 65.0 in | Wt 150.6 lb

## 2024-01-02 DIAGNOSIS — N1831 Chronic kidney disease, stage 3a: Secondary | ICD-10-CM | POA: Diagnosis not present

## 2024-01-02 DIAGNOSIS — E1122 Type 2 diabetes mellitus with diabetic chronic kidney disease: Secondary | ICD-10-CM

## 2024-01-02 DIAGNOSIS — J00 Acute nasopharyngitis [common cold]: Secondary | ICD-10-CM | POA: Diagnosis not present

## 2024-01-02 MED ORDER — LEVOCETIRIZINE DIHYDROCHLORIDE 5 MG PO TABS: 5.0000 mg | ORAL_TABLET | Freq: Every evening | ORAL | 2 refills | Status: AC
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
## 2024-01-13 ENCOUNTER — Other Ambulatory Visit: Payer: Self-pay | Admitting: Internal Medicine

## 2024-01-13 DIAGNOSIS — R809 Proteinuria, unspecified: Secondary | ICD-10-CM | POA: Diagnosis not present

## 2024-01-13 DIAGNOSIS — N189 Chronic kidney disease, unspecified: Secondary | ICD-10-CM | POA: Diagnosis not present

## 2024-01-13 DIAGNOSIS — D631 Anemia in chronic kidney disease: Secondary | ICD-10-CM | POA: Diagnosis not present

## 2024-01-19 ENCOUNTER — Other Ambulatory Visit: Payer: Self-pay | Admitting: Family Medicine

## 2024-01-21 DIAGNOSIS — I129 Hypertensive chronic kidney disease with stage 1 through stage 4 chronic kidney disease, or unspecified chronic kidney disease: Secondary | ICD-10-CM | POA: Diagnosis not present

## 2024-01-21 DIAGNOSIS — D638 Anemia in other chronic diseases classified elsewhere: Secondary | ICD-10-CM | POA: Diagnosis not present

## 2024-01-21 DIAGNOSIS — N1831 Chronic kidney disease, stage 3a: Secondary | ICD-10-CM | POA: Diagnosis not present

## 2024-01-21 NOTE — Telephone Encounter (Signed)
 Requested Prescriptions  Pending Prescriptions Disp Refills   sucralfate  (CARAFATE ) 1 g tablet [Pharmacy Med Name: Sucralfate  1 GM Oral Tablet] 300 tablet 0    Sig: TAKE 1 TABLET BY MOUTH IN THE  MORNING AT NOON, AND AT BEDTIME     Gastroenterology: Antiacids Passed - 01/21/2024 11:40 AM      Passed - Valid encounter within last 12 months    Recent Outpatient Visits           2 weeks ago Acute rhinitis   [place] [facility] Duanne Butler DASEN, MD   6 months ago Near syncope   [place] [facility] Duanne Butler DASEN, MD   7 months ago Abscess of skin of abdomen   [place] [facility] Duanne Butler DASEN, MD   8 months ago Stage 3b chronic kidney disease [facility])   [place] [facility] Duanne Butler DASEN, MD   12 months ago Benign essential HTN   [place] [facility] Pickard, Butler DASEN, MD       Future Appointments             In 4 months Deveshwar, Maya, MD [facility] - A Dept Of [place]

## 2024-02-02 ENCOUNTER — Encounter: Payer: Self-pay | Admitting: Family Medicine

## 2024-02-02 ENCOUNTER — Ambulatory Visit (INDEPENDENT_AMBULATORY_CARE_PROVIDER_SITE_OTHER): Admitting: Family Medicine

## 2024-02-02 VITALS — BP 120/70 | HR 51 | Temp 97.8°F | Ht 65.0 in | Wt 151.6 lb

## 2024-02-02 DIAGNOSIS — Z0001 Encounter for general adult medical examination with abnormal findings: Secondary | ICD-10-CM

## 2024-02-02 DIAGNOSIS — N1831 Chronic kidney disease, stage 3a: Secondary | ICD-10-CM

## 2024-02-02 DIAGNOSIS — E1122 Type 2 diabetes mellitus with diabetic chronic kidney disease: Secondary | ICD-10-CM

## 2024-02-02 DIAGNOSIS — I10 Essential (primary) hypertension: Secondary | ICD-10-CM

## 2024-02-02 DIAGNOSIS — Z125 Encounter for screening for malignant neoplasm of prostate: Secondary | ICD-10-CM | POA: Diagnosis not present

## 2024-02-02 DIAGNOSIS — Z23 Encounter for immunization: Secondary | ICD-10-CM

## 2024-02-02 DIAGNOSIS — Z0000 Encounter for general adult medical examination without abnormal findings: Secondary | ICD-10-CM

## 2024-02-02 LAB — LIPID PANEL
Cholesterol: 112 mg/dL (ref <200)
HDL: 41 mg/dL (ref >=40)
LDL Cholesterol (Calc): 59 mg/dL
Non-HDL Cholesterol (Calc): 71 mg/dL (ref <130)
Total CHOL/HDL Ratio: 2.7 (calc) (ref <5.0)
Triglycerides: 50 mg/dL (ref <150)

## 2024-02-02 LAB — CBC WITH DIFFERENTIAL/PLATELET
Absolute Lymphocytes: 2285 {cells}/uL (ref 850–3900)
Absolute Monocytes: 525 {cells}/uL (ref 200–950)
Basophils Absolute: 30 {cells}/uL (ref 0–200)
Basophils Relative: 0.6 %
Eosinophils Absolute: 260 {cells}/uL (ref 15–500)
Eosinophils Relative: 5.2 %
HCT: 37.6 % — ABNORMAL LOW (ref 38.5–50.0)
Hemoglobin: 12.5 g/dL — ABNORMAL LOW (ref 13.2–17.1)
MCH: 30.3 pg (ref 27.0–33.0)
MCHC: 33.2 g/dL (ref 32.0–36.0)
MCV: 91.3 fL (ref 80.0–100.0)
MPV: 10 fL (ref 7.5–12.5)
Monocytes Relative: 10.5 %
Neutro Abs: 1900 {cells}/uL (ref 1500–7800)
Neutrophils Relative %: 38 %
Platelets: 203 Thousand/uL (ref 140–400)
RBC: 4.12 Million/uL — ABNORMAL LOW (ref 4.20–5.80)
RDW: 13.2 % (ref 11.0–15.0)
Total Lymphocyte: 45.7 %
WBC: 5 Thousand/uL (ref 3.8–10.8)

## 2024-02-02 LAB — COMPREHENSIVE METABOLIC PANEL WITH GFR
AG Ratio: 1.4 (calc) (ref 1.0–2.5)
ALT: 24 U/L (ref 9–46)
AST: 24 U/L (ref 10–35)
Albumin: 4.2 g/dL (ref 3.6–5.1)
Alkaline phosphatase (APISO): 34 U/L — ABNORMAL LOW (ref 35–144)
BUN/Creatinine Ratio: 19 (calc) (ref 6–22)
BUN: 28 mg/dL — ABNORMAL HIGH (ref 7–25)
CO2: 31 mmol/L (ref 20–32)
Calcium: 9.6 mg/dL (ref 8.6–10.3)
Chloride: 101 mmol/L (ref 98–110)
Creat: 1.48 mg/dL — ABNORMAL HIGH (ref 0.70–1.35)
Globulin: 3.1 g/dL (ref 1.9–3.7)
Glucose, Bld: 109 mg/dL — ABNORMAL HIGH (ref 65–99)
Potassium: 4.3 mmol/L (ref 3.5–5.3)
Sodium: 139 mmol/L (ref 135–146)
Total Bilirubin: 0.4 mg/dL (ref 0.2–1.2)
Total Protein: 7.3 g/dL (ref 6.1–8.1)
eGFR: 53 mL/min/1.73m2 — ABNORMAL LOW (ref >=60)

## 2024-02-02 LAB — PSA: PSA: 0.94 ng/mL (ref <=4.00)

## 2024-02-02 MED ORDER — ROSUVASTATIN CALCIUM 5 MG PO TABS
5.0000 mg | ORAL_TABLET | Freq: Every day | ORAL | 3 refills | Status: AC
Start: 2024-02-02 — End: ?

## 2024-02-02 NOTE — Addendum Note (Signed)
 Addended by: ANGELENA RONAL BRADLEY K on: 02/02/2024 11:15 AM   Modules accepted: Orders
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
## 2024-02-03 ENCOUNTER — Ambulatory Visit: Payer: Self-pay | Admitting: Family Medicine

## 2024-02-15 ENCOUNTER — Other Ambulatory Visit: Payer: Self-pay | Admitting: Family Medicine

## 2024-02-15 DIAGNOSIS — G8929 Other chronic pain: Secondary | ICD-10-CM

## 2024-02-18 ENCOUNTER — Encounter: Payer: Self-pay | Admitting: Gastroenterology

## 2024-03-01 ENCOUNTER — Other Ambulatory Visit: Payer: Self-pay

## 2024-03-01 ENCOUNTER — Telehealth: Payer: Self-pay

## 2024-03-01 DIAGNOSIS — G8929 Other chronic pain: Secondary | ICD-10-CM

## 2024-03-01 MED ORDER — DULOXETINE HCL 30 MG PO CPEP: 30.0000 mg | ORAL_CAPSULE | Freq: Every day | ORAL | 3 refills | Status: AC

## 2024-03-01 NOTE — Telephone Encounter (Signed)
 90 day prescription request for: Prescription Request  03/01/2024  LOV: 02/02/24  What is the name of the medication or equipment? DULoxetine  (CYMBALTA ) 30 MG capsule [[id number]]   Have you contacted your pharmacy to request a refill? Yes   Which pharmacy would you like this sent to?  [id number] - [place], [place] - [address] AT [facility] [address] [place]  [address] Phone: [phone] Fax: [phone]    Patient notified that their request is being sent to the clinical staff for review and that they should receive a response within 2 business days.   Please advise at [facility] [phone]

## 2024-03-04 ENCOUNTER — Ambulatory Visit (INDEPENDENT_AMBULATORY_CARE_PROVIDER_SITE_OTHER)

## 2024-03-04 DIAGNOSIS — Z23 Encounter for immunization: Secondary | ICD-10-CM

## 2024-03-04 NOTE — Progress Notes (Signed)
 Patient is in office today for a nurse visit for Immunization. Patient Injection was given in the  Left deltoid. Patient tolerated injection well.

## 2024-03-08 ENCOUNTER — Encounter: Payer: Self-pay | Admitting: Radiology

## 2024-03-11 ENCOUNTER — Ambulatory Visit: Payer: 59 | Admitting: *Deleted

## 2024-03-11 VITALS — Ht 65.0 in | Wt 151.0 lb

## 2024-03-11 DIAGNOSIS — Z0000 Encounter for general adult medical examination without abnormal findings: Secondary | ICD-10-CM
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
## 2024-03-11 NOTE — Patient Instructions (Signed)
 Brandon Lawrence , Thank you for taking time to come for your Medicare Wellness Visit. I appreciate your ongoing commitment to your health goals. Please review the following plan we discussed and let me know if I can assist you in the future.   Screening recommendations/referrals: Colonoscopy: up to date Recommended yearly ophthalmology/optometry visit for glaucoma screening and checkup Recommended yearly dental visit for hygiene and checkup  Vaccinations: Influenza vaccine:  Pneumococcal vaccine:  Tdap vaccine:  Shingles vaccine:        Preventive Care 40-64 Years, Male Preventive care refers to lifestyle choices and visits with your health care provider that can promote health and wellness. What does preventive care include? A yearly physical exam. This is also called an annual well check. Dental exams once or twice a year. Routine eye exams. Ask your health care provider how often you should have your eyes checked. Personal lifestyle choices, including: Daily care of your teeth and gums. Regular physical activity. Eating a healthy diet. Avoiding tobacco and drug use. Limiting alcohol use. Practicing safe sex. Taking low-dose aspirin  every day starting at age 41. What happens during an annual well check? The services and screenings done by your health care provider during your annual well check will depend on your age, overall health, lifestyle risk factors, and family history of disease. Counseling  Your health care provider may ask you questions about your: Alcohol use. Tobacco use. Drug use. Emotional well-being. Home and relationship well-being. Sexual activity. Eating habits. Work and work astronomer. Screening  You may have the following tests or measurements: Height, weight, and BMI. Blood pressure. Lipid and cholesterol levels. These may be checked every 5 years, or more frequently if you are over 53 years old. Skin check. Lung cancer screening. You may have  this screening every year starting at age 53 if you have a 30-pack-year history of smoking and currently smoke or have quit within the past 15 years. Fecal occult blood test (FOBT) of the stool. You may have this test every year starting at age 53. Flexible sigmoidoscopy or colonoscopy. You may have a sigmoidoscopy every 5 years or a colonoscopy every 10 years starting at age 9. Prostate cancer screening. Recommendations will vary depending on your family history and other risks. Hepatitis C blood test. Hepatitis B blood test. Sexually transmitted disease (STD) testing. Diabetes screening. This is done by checking your blood sugar (glucose) after you have not eaten for a while (fasting). You may have this done every 1-3 years. Discuss your test results, treatment options, and if necessary, the need for more tests with your health care provider. Vaccines  Your health care provider may recommend certain vaccines, such as: Influenza vaccine. This is recommended every year. Tetanus, diphtheria, and acellular pertussis (Tdap, Td) vaccine. You may need a Td booster every 10 years. Zoster vaccine. You may need this after age 26. Pneumococcal 13-valent conjugate (PCV13) vaccine. You may need this if you have certain conditions and have not been vaccinated. Pneumococcal polysaccharide (PPSV23) vaccine. You may need one or two doses if you smoke cigarettes or if you have certain conditions. Talk to your health care provider about which screenings and vaccines you need and how often you need them. This information is not intended to replace advice given to you by your health care provider. Make sure you discuss any questions you have with your health care provider. Document Released: 05/19/2015 Document Revised: 01/10/2016 Document Reviewed: 02/21/2015 Elsevier Interactive Patient Education  2017 [employer].  Fall Prevention  in the Home Falls can cause injuries. They can happen to people of all ages.  There are many things you can do to make your home safe and to help prevent falls. What can I do on the outside of my home? Regularly fix the edges of walkways and driveways and fix any cracks. Remove anything that might make you trip as you walk through a door, such as a raised step or threshold. Trim any bushes or trees on the path to your home. Use bright outdoor lighting. Clear any walking paths of anything that might make someone trip, such as rocks or tools. Regularly check to see if handrails are loose or broken. Make sure that both sides of any steps have handrails. Any raised decks and porches should have guardrails on the edges. Have any leaves, snow, or ice cleared regularly. Use sand or salt on walking paths during winter. Clean up any spills in your garage right away. This includes oil or grease spills. What can I do in the bathroom? Use night lights. Install grab bars by the toilet and in the tub and shower. Do not use towel bars as grab bars. Use non-skid mats or decals in the tub or shower. If you need to sit down in the shower, use a plastic, non-slip stool. Keep the floor dry. Clean up any water  that spills on the floor as soon as it happens. Remove soap buildup in the tub or shower regularly. Attach bath mats securely with double-sided non-slip rug tape. Do not have throw rugs and other things on the floor that can make you trip. What can I do in the bedroom? Use night lights. Make sure that you have a light by your bed that is easy to reach. Do not use any sheets or blankets that are too big for your bed. They should not hang down onto the floor. Have a firm chair that has side arms. You can use this for support while you get dressed. Do not have throw rugs and other things on the floor that can make you trip. What can I do in the kitchen? Clean up any spills right away. Avoid walking on wet floors. Keep items that you use a lot in easy-to-reach places. If you need  to reach something above you, use a strong step stool that has a grab bar. Keep electrical cords out of the way. Do not use floor polish or wax that makes floors slippery. If you must use wax, use non-skid floor wax. Do not have throw rugs and other things on the floor that can make you trip. What can I do with my stairs? Do not leave any items on the stairs. Make sure that there are handrails on both sides of the stairs and use them. Fix handrails that are broken or loose. Make sure that handrails are as long as the stairways. Check any carpeting to make sure that it is firmly attached to the stairs. Fix any carpet that is loose or worn. Avoid having throw rugs at the top or bottom of the stairs. If you do have throw rugs, attach them to the floor with carpet tape. Make sure that you have a light switch at the top of the stairs and the bottom of the stairs. If you do not have them, ask someone to add them for you. What else can I do to help prevent falls? Wear shoes that: Do not have high heels. Have rubber bottoms. Are comfortable and fit you well.  Are closed at the toe. Do not wear sandals. If you use a stepladder: Make sure that it is fully opened. Do not climb a closed stepladder. Make sure that both sides of the stepladder are locked into place. Ask someone to hold it for you, if possible. Clearly mark and make sure that you can see: Any grab bars or handrails. First and last steps. Where the edge of each step is. Use tools that help you move around (mobility aids) if they are needed. These include: Canes. Walkers. Scooters. Crutches. Turn on the lights when you go into a dark area. Replace any light bulbs as soon as they burn out. Set up your furniture so you have a clear path. Avoid moving your furniture around. If any of your floors are uneven, fix them. If there are any pets around you, be aware of where they are. Review your medicines with your doctor. Some medicines can  make you feel dizzy. This can increase your chance of falling. Ask your doctor what other things that you can do to help prevent falls. This information is not intended to replace advice given to you by your health care provider. Make sure you discuss any questions you have with your health care provider. Document Released: 02/16/2009 Document Revised: 09/28/2015 Document Reviewed: 05/27/2014 Elsevier Interactive Patient Education  2017 [employer].

## 2024-03-23 ENCOUNTER — Other Ambulatory Visit: Payer: Self-pay | Admitting: Family Medicine

## 2024-03-23 DIAGNOSIS — I10 Essential (primary) hypertension: Secondary | ICD-10-CM

## 2024-03-25 ENCOUNTER — Other Ambulatory Visit: Payer: Self-pay | Admitting: Family Medicine

## 2024-03-25 DIAGNOSIS — J069 Acute upper respiratory infection, unspecified: Secondary | ICD-10-CM

## 2024-03-25 DIAGNOSIS — T485X5A Adverse effect of other anti-common-cold drugs, initial encounter: Secondary | ICD-10-CM

## 2024-03-26 NOTE — Telephone Encounter (Signed)
 Requested Prescriptions  Pending Prescriptions Disp Refills   doxazosin  (CARDURA ) 4 MG tablet [Pharmacy Med Name: Doxazosin  Mesylate 4 MG Oral Tablet] 100 tablet 2    Sig: TAKE 1 TABLET BY MOUTH DAILY     Cardiovascular:  Alpha Blockers Passed - 03/26/2024 11:34 AM      Passed - Last BP in normal range    BP Readings from Last 1 Encounters:  02/02/24 120/70         Passed - Valid encounter within last 6 months    Recent Outpatient Visits           1 month ago Type 2 diabetes mellitus with stage 3a chronic kidney disease, unspecified whether long term insulin use (HCC)   [place] [facility] Pickard, Butler DASEN, MD   2 months ago Acute rhinitis   [place] [facility] Duanne, Butler DASEN, MD   8 months ago Near syncope   [place] [facility] Duanne Butler DASEN, MD   10 months ago Abscess of skin of abdomen   [place] [facility] Duanne Butler DASEN, MD   10 months ago Stage 3b chronic kidney disease [facility])   [place] [facility] Pickard, Butler DASEN, MD       Future Appointments             In 2 months Deveshwar, Maya, MD [place] [facility] Rheumatology - A Dept Of [place]. [facility]             dexlansoprazole  (DEXILANT ) 60 MG capsule [Pharmacy Med Name: DEXLANSOPRAZOLE   60MG   CAP  DELAYED RELEASE] 30 capsule 11    Sig: TAKE 1 CAPSULE BY MOUTH DAILY     Gastroenterology: Proton Pump Inhibitors Passed - 03/26/2024 11:34 AM      Passed - Valid encounter within last 12 months    Recent Outpatient Visits           1 month ago Type 2 diabetes mellitus with stage 3a chronic kidney disease, unspecified whether long term insulin use (HCC)   [place] [facility] Pickard, Butler DASEN, MD   2 months ago Acute rhinitis   [place] [facility] Duanne, Butler DASEN, MD   8 months ago Near syncope   [place] [facility] Duanne Butler DASEN, MD   10 months ago Abscess of skin of abdomen   [place] [facility] Duanne Butler DASEN, MD   10 months ago Stage 3b chronic kidney disease [facility])   [place] [facility] Pickard, Butler DASEN, MD       Future Appointments             In 2 months Deveshwar, Maya, MD [place] [facility] Rheumatology - A Dept Of . [facility]

## 2024-05-04 ENCOUNTER — Other Ambulatory Visit: Payer: Self-pay | Admitting: Family Medicine

## 2024-05-04 NOTE — Progress Notes (Signed)
 Brandon Lawrence                                          MRN: [id number]   05/04/2024   The [facility] Quality Team Specialist reviewed this patient medical record for the purposes of chart review for care gap closure. The following were reviewed: abstraction for care gap closure-kidney health evaluation for diabetes:eGFR  and uACR.    [facility] Quality Team

## 2024-05-13 ENCOUNTER — Other Ambulatory Visit: Payer: Self-pay | Admitting: Family Medicine

## 2024-05-17 ENCOUNTER — Other Ambulatory Visit: Payer: Self-pay | Admitting: Family Medicine

## 2024-05-18 NOTE — Telephone Encounter (Unsigned)
 Copied from CRM [id number]. Topic: Clinical - Medication Refill >> May 18, 2024  3:59 PM Olam RAMAN wrote: Medication: chlorthalidone (HYGROTON) 25 MG tablet   Has the patient contacted their pharmacy? Yes (Agent: If no, request that the patient contact the pharmacy for the refill. If patient does not wish to contact the pharmacy document the reason why and proceed with request.) (Agent: If yes, when and what did the pharmacy advise?)  This is the patient's preferred pharmacy:  [id number] - [place], [place] - [address] AT [facility] [address]  [place] [address] Phone: [phone] Fax: [phone]  [facility] - [place], [place] - [address] [place] [address] Phone: [phone] Fax: [phone]  Is this the correct pharmacy for this prescription? Yes If no, delete pharmacy and type the correct one.   Has the prescription been filled recently? Yes  Is the patient out of the medication? No  Has the patient been seen for an appointment in the last year OR does the patient have an upcoming appointment? Yes  Can we respond through MyChart? Yes  Agent: Please be advised that Rx refills may take up to 3 business days. We ask that you follow-up with your pharmacy.

## 2024-06-16 ENCOUNTER — Ambulatory Visit: Payer: 59 | Admitting: Rheumatology

## 2024-07-07 ENCOUNTER — Ambulatory Visit: Admitting: Rheumatology

## 2024-08-13 ENCOUNTER — Ambulatory Visit: Admitting: Internal Medicine

## 2025-02-03 ENCOUNTER — Encounter: Admitting: Family Medicine

## 2025-03-17 ENCOUNTER — Ambulatory Visit
